# Patient Record
Sex: Male | Born: 1950 | Race: White | Hispanic: No | Smoking: Former smoker
Health system: Southern US, Community
[De-identification: ages and names within clinical notes are randomized; demographics above are authoritative.]

## PROBLEM LIST (undated history)

## (undated) DIAGNOSIS — J45909 Unspecified asthma, uncomplicated: Secondary | ICD-10-CM

## (undated) DIAGNOSIS — I639 Cerebral infarction, unspecified: Secondary | ICD-10-CM

## (undated) DIAGNOSIS — F319 Bipolar disorder, unspecified: Secondary | ICD-10-CM

## (undated) DIAGNOSIS — I2510 Atherosclerotic heart disease of native coronary artery without angina pectoris: Secondary | ICD-10-CM

## (undated) DIAGNOSIS — F988 Other specified behavioral and emotional disorders with onset usually occurring in childhood and adolescence: Secondary | ICD-10-CM

## (undated) DIAGNOSIS — F329 Major depressive disorder, single episode, unspecified: Secondary | ICD-10-CM

## (undated) DIAGNOSIS — E1165 Type 2 diabetes mellitus with hyperglycemia: Secondary | ICD-10-CM

## (undated) DIAGNOSIS — E782 Mixed hyperlipidemia: Secondary | ICD-10-CM

## (undated) DIAGNOSIS — I6529 Occlusion and stenosis of unspecified carotid artery: Secondary | ICD-10-CM

## (undated) DIAGNOSIS — F32A Depression, unspecified: Secondary | ICD-10-CM

## (undated) DIAGNOSIS — G911 Obstructive hydrocephalus: Secondary | ICD-10-CM

## (undated) DIAGNOSIS — W3400XA Accidental discharge from unspecified firearms or gun, initial encounter: Secondary | ICD-10-CM

## (undated) DIAGNOSIS — IMO0001 Reserved for inherently not codable concepts without codable children: Secondary | ICD-10-CM

## (undated) DIAGNOSIS — G8111 Spastic hemiplegia affecting right dominant side: Secondary | ICD-10-CM

## (undated) DIAGNOSIS — R1312 Dysphagia, oropharyngeal phase: Secondary | ICD-10-CM

## (undated) DIAGNOSIS — Z9289 Personal history of other medical treatment: Secondary | ICD-10-CM

## (undated) DIAGNOSIS — Z8719 Personal history of other diseases of the digestive system: Secondary | ICD-10-CM

## (undated) DIAGNOSIS — F419 Anxiety disorder, unspecified: Secondary | ICD-10-CM

## (undated) HISTORY — DX: Type 2 diabetes mellitus with hyperglycemia: E11.65

## (undated) HISTORY — PX: NASAL SEPTUM SURGERY: SHX37

## (undated) HISTORY — DX: Occlusion and stenosis of unspecified carotid artery: I65.29

## (undated) HISTORY — DX: Bipolar disorder, unspecified: F31.9

## (undated) HISTORY — DX: Obstructive hydrocephalus: G91.1

## (undated) HISTORY — PX: ABDOMINAL SURGERY: SHX537

## (undated) HISTORY — DX: Mixed hyperlipidemia: E78.2

## (undated) HISTORY — DX: Personal history of other medical treatment: Z92.89

## (undated) HISTORY — DX: Accidental discharge from unspecified firearms or gun, initial encounter: W34.00XA

## (undated) HISTORY — DX: Reserved for inherently not codable concepts without codable children: IMO0001

## (undated) HISTORY — PX: TOE SURGERY: SHX1073

## (undated) HISTORY — DX: Dysphagia, oropharyngeal phase: R13.12

## (undated) HISTORY — DX: Other specified behavioral and emotional disorders with onset usually occurring in childhood and adolescence: F98.8

## (undated) HISTORY — DX: Cerebral infarction, unspecified: I63.9

## (undated) HISTORY — DX: Spastic hemiplegia affecting right dominant side: G81.11

## (undated) HISTORY — DX: Atherosclerotic heart disease of native coronary artery without angina pectoris: I25.10

---

## 1998-04-24 ENCOUNTER — Ambulatory Visit (HOSPITAL_COMMUNITY): Admission: RE | Admit: 1998-04-24 | Discharge: 1998-04-24 | Payer: Self-pay | Admitting: Gastroenterology

## 1998-04-30 ENCOUNTER — Ambulatory Visit (HOSPITAL_COMMUNITY): Admission: RE | Admit: 1998-04-30 | Discharge: 1998-04-30 | Payer: Self-pay | Admitting: Gastroenterology

## 1998-05-29 ENCOUNTER — Encounter: Payer: Self-pay | Admitting: General Surgery

## 1998-06-03 ENCOUNTER — Inpatient Hospital Stay (HOSPITAL_COMMUNITY): Admission: EM | Admit: 1998-06-03 | Discharge: 1998-06-05 | Payer: Self-pay | Admitting: General Surgery

## 1999-01-01 ENCOUNTER — Inpatient Hospital Stay (HOSPITAL_COMMUNITY): Admission: AD | Admit: 1999-01-01 | Discharge: 1999-01-06 | Payer: Self-pay | Admitting: *Deleted

## 1999-05-29 ENCOUNTER — Ambulatory Visit (HOSPITAL_COMMUNITY): Admission: RE | Admit: 1999-05-29 | Discharge: 1999-05-29 | Payer: Self-pay | Admitting: Urology

## 1999-09-09 ENCOUNTER — Ambulatory Visit (HOSPITAL_COMMUNITY): Admission: RE | Admit: 1999-09-09 | Discharge: 1999-09-09 | Payer: Self-pay | Admitting: Gastroenterology

## 1999-11-20 ENCOUNTER — Ambulatory Visit (HOSPITAL_COMMUNITY): Admission: RE | Admit: 1999-11-20 | Discharge: 1999-11-20 | Payer: Self-pay | Admitting: Gastroenterology

## 1999-11-20 ENCOUNTER — Encounter (INDEPENDENT_AMBULATORY_CARE_PROVIDER_SITE_OTHER): Payer: Self-pay | Admitting: Specialist

## 2001-01-05 ENCOUNTER — Encounter: Admission: RE | Admit: 2001-01-05 | Discharge: 2001-01-05 | Payer: Self-pay | Admitting: Gastroenterology

## 2001-01-05 ENCOUNTER — Encounter: Payer: Self-pay | Admitting: Gastroenterology

## 2001-12-10 ENCOUNTER — Inpatient Hospital Stay (HOSPITAL_COMMUNITY): Admission: EM | Admit: 2001-12-10 | Discharge: 2001-12-16 | Payer: Self-pay | Admitting: *Deleted

## 2001-12-15 ENCOUNTER — Encounter (HOSPITAL_COMMUNITY): Payer: Self-pay | Admitting: Psychiatry

## 2001-12-15 ENCOUNTER — Ambulatory Visit (HOSPITAL_COMMUNITY): Admission: RE | Admit: 2001-12-15 | Discharge: 2001-12-15 | Payer: Self-pay | Admitting: Psychiatry

## 2001-12-19 ENCOUNTER — Other Ambulatory Visit (HOSPITAL_COMMUNITY): Admission: RE | Admit: 2001-12-19 | Discharge: 2001-12-28 | Payer: Self-pay | Admitting: Psychiatry

## 2003-05-26 HISTORY — PX: CARDIAC CATHETERIZATION: SHX172

## 2004-02-29 ENCOUNTER — Inpatient Hospital Stay (HOSPITAL_COMMUNITY): Admission: EM | Admit: 2004-02-29 | Discharge: 2004-03-03 | Payer: Self-pay | Admitting: Emergency Medicine

## 2004-02-29 ENCOUNTER — Ambulatory Visit: Payer: Self-pay | Admitting: Family Medicine

## 2004-03-03 ENCOUNTER — Encounter (INDEPENDENT_AMBULATORY_CARE_PROVIDER_SITE_OTHER): Payer: Self-pay | Admitting: *Deleted

## 2004-03-10 ENCOUNTER — Encounter: Admission: RE | Admit: 2004-03-10 | Discharge: 2004-04-03 | Payer: Self-pay | Admitting: Family Medicine

## 2004-03-17 ENCOUNTER — Encounter: Admission: RE | Admit: 2004-03-17 | Discharge: 2004-04-16 | Payer: Self-pay | Admitting: Emergency Medicine

## 2004-06-02 ENCOUNTER — Ambulatory Visit (HOSPITAL_COMMUNITY): Admission: RE | Admit: 2004-06-02 | Discharge: 2004-06-02 | Payer: Self-pay | Admitting: Cardiovascular Disease

## 2004-07-23 DIAGNOSIS — W3400XA Accidental discharge from unspecified firearms or gun, initial encounter: Secondary | ICD-10-CM

## 2004-07-23 HISTORY — DX: Accidental discharge from unspecified firearms or gun, initial encounter: W34.00XA

## 2004-07-31 ENCOUNTER — Ambulatory Visit: Payer: Self-pay | Admitting: *Deleted

## 2004-07-31 ENCOUNTER — Inpatient Hospital Stay (HOSPITAL_COMMUNITY): Admission: EM | Admit: 2004-07-31 | Discharge: 2004-09-08 | Payer: Self-pay

## 2004-08-11 ENCOUNTER — Ambulatory Visit: Payer: Self-pay | Admitting: Plastic Surgery

## 2004-08-25 ENCOUNTER — Ambulatory Visit: Payer: Self-pay | Admitting: Plastic Surgery

## 2004-09-13 ENCOUNTER — Ambulatory Visit: Payer: Self-pay | Admitting: Psychiatry

## 2004-10-30 ENCOUNTER — Other Ambulatory Visit (HOSPITAL_COMMUNITY): Admission: RE | Admit: 2004-10-30 | Discharge: 2004-11-11 | Payer: Self-pay | Admitting: Psychiatry

## 2004-10-30 ENCOUNTER — Ambulatory Visit: Payer: Self-pay | Admitting: Psychiatry

## 2004-12-02 ENCOUNTER — Ambulatory Visit (HOSPITAL_COMMUNITY): Admission: RE | Admit: 2004-12-02 | Discharge: 2004-12-02 | Payer: Self-pay | Admitting: Gastroenterology

## 2005-10-29 ENCOUNTER — Ambulatory Visit (HOSPITAL_COMMUNITY): Payer: Self-pay | Admitting: *Deleted

## 2005-12-10 ENCOUNTER — Inpatient Hospital Stay (HOSPITAL_COMMUNITY): Admission: EM | Admit: 2005-12-10 | Discharge: 2005-12-11 | Payer: Self-pay | Admitting: Emergency Medicine

## 2005-12-24 ENCOUNTER — Ambulatory Visit (HOSPITAL_COMMUNITY): Payer: Self-pay | Admitting: Licensed Clinical Social Worker

## 2006-01-05 ENCOUNTER — Ambulatory Visit (HOSPITAL_COMMUNITY): Payer: Self-pay | Admitting: *Deleted

## 2006-01-11 ENCOUNTER — Ambulatory Visit (HOSPITAL_COMMUNITY): Payer: Self-pay | Admitting: Licensed Clinical Social Worker

## 2006-01-26 ENCOUNTER — Ambulatory Visit (HOSPITAL_COMMUNITY): Payer: Self-pay | Admitting: Licensed Clinical Social Worker

## 2006-02-11 ENCOUNTER — Ambulatory Visit (HOSPITAL_COMMUNITY): Payer: Self-pay | Admitting: Licensed Clinical Social Worker

## 2006-03-08 ENCOUNTER — Ambulatory Visit (HOSPITAL_COMMUNITY): Payer: Self-pay | Admitting: Licensed Clinical Social Worker

## 2006-03-22 ENCOUNTER — Ambulatory Visit (HOSPITAL_COMMUNITY): Payer: Self-pay | Admitting: Licensed Clinical Social Worker

## 2006-04-06 ENCOUNTER — Ambulatory Visit (HOSPITAL_COMMUNITY): Payer: Self-pay | Admitting: Licensed Clinical Social Worker

## 2006-04-20 ENCOUNTER — Ambulatory Visit (HOSPITAL_COMMUNITY): Payer: Self-pay | Admitting: Licensed Clinical Social Worker

## 2006-05-26 ENCOUNTER — Ambulatory Visit (HOSPITAL_COMMUNITY): Payer: Self-pay | Admitting: Licensed Clinical Social Worker

## 2006-05-27 ENCOUNTER — Ambulatory Visit (HOSPITAL_COMMUNITY): Payer: Self-pay | Admitting: *Deleted

## 2006-06-08 ENCOUNTER — Ambulatory Visit (HOSPITAL_COMMUNITY): Payer: Self-pay | Admitting: Licensed Clinical Social Worker

## 2006-06-22 ENCOUNTER — Ambulatory Visit (HOSPITAL_COMMUNITY): Payer: Self-pay | Admitting: Licensed Clinical Social Worker

## 2006-06-29 ENCOUNTER — Ambulatory Visit (HOSPITAL_COMMUNITY): Payer: Self-pay | Admitting: *Deleted

## 2006-07-05 ENCOUNTER — Ambulatory Visit (HOSPITAL_COMMUNITY): Payer: Self-pay | Admitting: Licensed Clinical Social Worker

## 2006-08-05 ENCOUNTER — Ambulatory Visit (HOSPITAL_COMMUNITY): Payer: Self-pay | Admitting: Licensed Clinical Social Worker

## 2006-08-26 ENCOUNTER — Ambulatory Visit (HOSPITAL_COMMUNITY): Payer: Self-pay | Admitting: Licensed Clinical Social Worker

## 2006-08-31 ENCOUNTER — Ambulatory Visit (HOSPITAL_COMMUNITY): Payer: Self-pay | Admitting: *Deleted

## 2006-09-09 ENCOUNTER — Ambulatory Visit (HOSPITAL_COMMUNITY): Payer: Self-pay | Admitting: Licensed Clinical Social Worker

## 2006-09-22 ENCOUNTER — Ambulatory Visit (HOSPITAL_COMMUNITY): Payer: Self-pay | Admitting: Licensed Clinical Social Worker

## 2006-09-30 ENCOUNTER — Ambulatory Visit (HOSPITAL_COMMUNITY): Payer: Self-pay | Admitting: *Deleted

## 2006-10-04 ENCOUNTER — Ambulatory Visit (HOSPITAL_COMMUNITY): Payer: Self-pay | Admitting: Psychiatry

## 2006-10-21 ENCOUNTER — Ambulatory Visit (HOSPITAL_COMMUNITY): Payer: Self-pay | Admitting: Licensed Clinical Social Worker

## 2006-10-27 ENCOUNTER — Ambulatory Visit (HOSPITAL_COMMUNITY): Payer: Self-pay | Admitting: Licensed Clinical Social Worker

## 2006-11-04 ENCOUNTER — Ambulatory Visit (HOSPITAL_COMMUNITY): Payer: Self-pay | Admitting: *Deleted

## 2006-11-24 ENCOUNTER — Ambulatory Visit (HOSPITAL_COMMUNITY): Payer: Self-pay | Admitting: Licensed Clinical Social Worker

## 2006-12-08 ENCOUNTER — Ambulatory Visit (HOSPITAL_COMMUNITY): Payer: Self-pay | Admitting: Licensed Clinical Social Worker

## 2006-12-14 ENCOUNTER — Ambulatory Visit (HOSPITAL_COMMUNITY): Payer: Self-pay | Admitting: *Deleted

## 2006-12-27 ENCOUNTER — Ambulatory Visit (HOSPITAL_COMMUNITY): Payer: Self-pay | Admitting: Licensed Clinical Social Worker

## 2007-01-04 ENCOUNTER — Ambulatory Visit (HOSPITAL_COMMUNITY): Payer: Self-pay | Admitting: *Deleted

## 2007-01-17 ENCOUNTER — Ambulatory Visit (HOSPITAL_COMMUNITY): Payer: Self-pay | Admitting: Licensed Clinical Social Worker

## 2007-01-18 ENCOUNTER — Encounter: Admission: RE | Admit: 2007-01-18 | Discharge: 2007-02-16 | Payer: Self-pay | Admitting: Internal Medicine

## 2007-02-01 ENCOUNTER — Ambulatory Visit (HOSPITAL_COMMUNITY): Payer: Self-pay | Admitting: *Deleted

## 2007-02-10 ENCOUNTER — Ambulatory Visit (HOSPITAL_COMMUNITY): Payer: Self-pay | Admitting: Licensed Clinical Social Worker

## 2007-02-15 ENCOUNTER — Ambulatory Visit (HOSPITAL_COMMUNITY): Payer: Self-pay | Admitting: *Deleted

## 2007-02-17 ENCOUNTER — Encounter: Admission: RE | Admit: 2007-02-17 | Discharge: 2007-03-16 | Payer: Self-pay | Admitting: Internal Medicine

## 2007-03-07 ENCOUNTER — Ambulatory Visit (HOSPITAL_COMMUNITY): Payer: Self-pay | Admitting: Licensed Clinical Social Worker

## 2007-03-17 ENCOUNTER — Ambulatory Visit (HOSPITAL_COMMUNITY): Payer: Self-pay | Admitting: Licensed Clinical Social Worker

## 2007-03-24 ENCOUNTER — Ambulatory Visit (HOSPITAL_COMMUNITY): Payer: Self-pay | Admitting: Licensed Clinical Social Worker

## 2007-03-29 ENCOUNTER — Ambulatory Visit (HOSPITAL_COMMUNITY): Payer: Self-pay | Admitting: *Deleted

## 2007-04-13 ENCOUNTER — Emergency Department (HOSPITAL_COMMUNITY): Admission: EM | Admit: 2007-04-13 | Discharge: 2007-04-13 | Payer: Self-pay | Admitting: Emergency Medicine

## 2007-04-25 ENCOUNTER — Ambulatory Visit (HOSPITAL_COMMUNITY): Payer: Self-pay | Admitting: Licensed Clinical Social Worker

## 2007-05-10 ENCOUNTER — Ambulatory Visit (HOSPITAL_COMMUNITY): Payer: Self-pay | Admitting: *Deleted

## 2007-05-23 ENCOUNTER — Ambulatory Visit (HOSPITAL_COMMUNITY): Payer: Self-pay | Admitting: Licensed Clinical Social Worker

## 2007-06-02 ENCOUNTER — Inpatient Hospital Stay (HOSPITAL_COMMUNITY): Admission: RE | Admit: 2007-06-02 | Discharge: 2007-06-06 | Payer: Self-pay | Admitting: Psychiatry

## 2007-06-02 ENCOUNTER — Ambulatory Visit: Payer: Self-pay | Admitting: Psychiatry

## 2007-06-07 ENCOUNTER — Ambulatory Visit (HOSPITAL_COMMUNITY): Payer: Self-pay | Admitting: Licensed Clinical Social Worker

## 2007-06-09 ENCOUNTER — Ambulatory Visit (HOSPITAL_COMMUNITY): Payer: Self-pay | Admitting: *Deleted

## 2007-06-27 ENCOUNTER — Ambulatory Visit (HOSPITAL_COMMUNITY): Payer: Self-pay | Admitting: Licensed Clinical Social Worker

## 2007-07-06 ENCOUNTER — Ambulatory Visit (HOSPITAL_COMMUNITY): Payer: Self-pay | Admitting: Licensed Clinical Social Worker

## 2007-07-14 ENCOUNTER — Ambulatory Visit (HOSPITAL_COMMUNITY): Payer: Self-pay | Admitting: *Deleted

## 2007-07-27 ENCOUNTER — Ambulatory Visit (HOSPITAL_COMMUNITY): Payer: Self-pay | Admitting: Licensed Clinical Social Worker

## 2007-08-03 ENCOUNTER — Ambulatory Visit (HOSPITAL_COMMUNITY): Payer: Self-pay | Admitting: Licensed Clinical Social Worker

## 2007-08-10 ENCOUNTER — Ambulatory Visit (HOSPITAL_COMMUNITY): Payer: Self-pay | Admitting: Licensed Clinical Social Worker

## 2007-08-11 ENCOUNTER — Ambulatory Visit (HOSPITAL_COMMUNITY): Payer: Self-pay | Admitting: *Deleted

## 2007-08-18 ENCOUNTER — Ambulatory Visit (HOSPITAL_COMMUNITY): Payer: Self-pay | Admitting: Licensed Clinical Social Worker

## 2007-09-01 ENCOUNTER — Ambulatory Visit (HOSPITAL_COMMUNITY): Payer: Self-pay | Admitting: Licensed Clinical Social Worker

## 2007-09-07 ENCOUNTER — Ambulatory Visit (HOSPITAL_COMMUNITY): Payer: Self-pay | Admitting: Licensed Clinical Social Worker

## 2007-09-08 ENCOUNTER — Ambulatory Visit (HOSPITAL_COMMUNITY): Payer: Self-pay | Admitting: *Deleted

## 2007-09-22 ENCOUNTER — Ambulatory Visit (HOSPITAL_COMMUNITY): Payer: Self-pay | Admitting: Licensed Clinical Social Worker

## 2007-10-05 ENCOUNTER — Ambulatory Visit (HOSPITAL_COMMUNITY): Payer: Self-pay | Admitting: Licensed Clinical Social Worker

## 2007-10-05 ENCOUNTER — Emergency Department (HOSPITAL_COMMUNITY): Admission: EM | Admit: 2007-10-05 | Discharge: 2007-10-05 | Payer: Self-pay | Admitting: Emergency Medicine

## 2007-10-12 ENCOUNTER — Ambulatory Visit (HOSPITAL_COMMUNITY): Payer: Self-pay | Admitting: Licensed Clinical Social Worker

## 2007-10-13 ENCOUNTER — Ambulatory Visit (HOSPITAL_COMMUNITY): Payer: Self-pay | Admitting: *Deleted

## 2007-10-26 ENCOUNTER — Ambulatory Visit (HOSPITAL_COMMUNITY): Payer: Self-pay | Admitting: Licensed Clinical Social Worker

## 2007-11-02 ENCOUNTER — Ambulatory Visit (HOSPITAL_COMMUNITY): Payer: Self-pay | Admitting: Licensed Clinical Social Worker

## 2007-11-16 ENCOUNTER — Ambulatory Visit (HOSPITAL_COMMUNITY): Payer: Self-pay | Admitting: Licensed Clinical Social Worker

## 2007-11-22 ENCOUNTER — Ambulatory Visit (HOSPITAL_COMMUNITY): Payer: Self-pay | Admitting: *Deleted

## 2007-12-05 ENCOUNTER — Ambulatory Visit (HOSPITAL_COMMUNITY): Payer: Self-pay | Admitting: Licensed Clinical Social Worker

## 2007-12-06 ENCOUNTER — Encounter: Admission: RE | Admit: 2007-12-06 | Discharge: 2008-02-16 | Payer: Self-pay | Admitting: Emergency Medicine

## 2007-12-15 ENCOUNTER — Ambulatory Visit (HOSPITAL_COMMUNITY): Payer: Self-pay | Admitting: Licensed Clinical Social Worker

## 2007-12-21 ENCOUNTER — Ambulatory Visit (HOSPITAL_COMMUNITY): Payer: Self-pay | Admitting: Licensed Clinical Social Worker

## 2007-12-23 ENCOUNTER — Ambulatory Visit: Payer: Self-pay | Admitting: Family Medicine

## 2007-12-23 ENCOUNTER — Encounter: Payer: Self-pay | Admitting: *Deleted

## 2007-12-23 ENCOUNTER — Inpatient Hospital Stay (HOSPITAL_COMMUNITY): Admission: AD | Admit: 2007-12-23 | Discharge: 2007-12-25 | Payer: Self-pay | Admitting: Family Medicine

## 2007-12-24 ENCOUNTER — Encounter: Payer: Self-pay | Admitting: Family Medicine

## 2008-01-03 ENCOUNTER — Ambulatory Visit (HOSPITAL_COMMUNITY): Payer: Self-pay | Admitting: Licensed Clinical Social Worker

## 2008-01-03 ENCOUNTER — Ambulatory Visit (HOSPITAL_COMMUNITY): Payer: Self-pay | Admitting: *Deleted

## 2008-02-02 ENCOUNTER — Ambulatory Visit (HOSPITAL_COMMUNITY): Payer: Self-pay | Admitting: Licensed Clinical Social Worker

## 2008-02-09 ENCOUNTER — Ambulatory Visit (HOSPITAL_COMMUNITY): Payer: Self-pay | Admitting: Licensed Clinical Social Worker

## 2008-02-16 ENCOUNTER — Ambulatory Visit (HOSPITAL_COMMUNITY): Payer: Self-pay | Admitting: Licensed Clinical Social Worker

## 2008-02-23 ENCOUNTER — Ambulatory Visit (HOSPITAL_COMMUNITY): Payer: Self-pay | Admitting: Licensed Clinical Social Worker

## 2008-02-28 ENCOUNTER — Ambulatory Visit (HOSPITAL_COMMUNITY): Payer: Self-pay | Admitting: *Deleted

## 2008-03-22 ENCOUNTER — Ambulatory Visit (HOSPITAL_COMMUNITY): Payer: Self-pay | Admitting: Licensed Clinical Social Worker

## 2008-04-02 ENCOUNTER — Ambulatory Visit (HOSPITAL_COMMUNITY): Payer: Self-pay | Admitting: Licensed Clinical Social Worker

## 2008-04-12 ENCOUNTER — Ambulatory Visit (HOSPITAL_COMMUNITY): Payer: Self-pay | Admitting: *Deleted

## 2008-04-17 ENCOUNTER — Ambulatory Visit (HOSPITAL_COMMUNITY): Payer: Self-pay | Admitting: Licensed Clinical Social Worker

## 2008-05-03 ENCOUNTER — Ambulatory Visit (HOSPITAL_COMMUNITY): Payer: Self-pay | Admitting: Licensed Clinical Social Worker

## 2008-05-08 ENCOUNTER — Ambulatory Visit (HOSPITAL_COMMUNITY): Payer: Self-pay | Admitting: *Deleted

## 2008-05-10 ENCOUNTER — Ambulatory Visit (HOSPITAL_COMMUNITY): Payer: Self-pay | Admitting: Licensed Clinical Social Worker

## 2008-05-22 ENCOUNTER — Ambulatory Visit (HOSPITAL_COMMUNITY): Payer: Self-pay | Admitting: Licensed Clinical Social Worker

## 2008-05-30 ENCOUNTER — Ambulatory Visit (HOSPITAL_COMMUNITY): Payer: Self-pay | Admitting: Licensed Clinical Social Worker

## 2008-05-31 ENCOUNTER — Ambulatory Visit (HOSPITAL_COMMUNITY): Payer: Self-pay | Admitting: *Deleted

## 2008-06-06 ENCOUNTER — Ambulatory Visit (HOSPITAL_COMMUNITY): Payer: Self-pay | Admitting: Licensed Clinical Social Worker

## 2008-06-13 ENCOUNTER — Ambulatory Visit (HOSPITAL_COMMUNITY): Payer: Self-pay | Admitting: Licensed Clinical Social Worker

## 2008-06-20 ENCOUNTER — Ambulatory Visit (HOSPITAL_COMMUNITY): Payer: Self-pay | Admitting: Licensed Clinical Social Worker

## 2008-07-04 ENCOUNTER — Ambulatory Visit (HOSPITAL_COMMUNITY): Payer: Self-pay | Admitting: Licensed Clinical Social Worker

## 2008-07-26 ENCOUNTER — Ambulatory Visit (HOSPITAL_COMMUNITY): Payer: Self-pay | Admitting: *Deleted

## 2008-07-26 ENCOUNTER — Ambulatory Visit (HOSPITAL_COMMUNITY): Payer: Self-pay | Admitting: Licensed Clinical Social Worker

## 2008-08-01 ENCOUNTER — Ambulatory Visit (HOSPITAL_COMMUNITY): Payer: Self-pay | Admitting: Licensed Clinical Social Worker

## 2008-08-15 ENCOUNTER — Ambulatory Visit (HOSPITAL_COMMUNITY): Payer: Self-pay | Admitting: Licensed Clinical Social Worker

## 2008-08-23 ENCOUNTER — Ambulatory Visit (HOSPITAL_COMMUNITY): Payer: Self-pay | Admitting: Licensed Clinical Social Worker

## 2008-09-11 ENCOUNTER — Ambulatory Visit (HOSPITAL_COMMUNITY): Payer: Self-pay | Admitting: Licensed Clinical Social Worker

## 2008-09-11 ENCOUNTER — Ambulatory Visit (HOSPITAL_COMMUNITY): Payer: Self-pay | Admitting: *Deleted

## 2008-09-25 ENCOUNTER — Ambulatory Visit (HOSPITAL_COMMUNITY): Payer: Self-pay | Admitting: Licensed Clinical Social Worker

## 2008-10-03 ENCOUNTER — Ambulatory Visit (HOSPITAL_COMMUNITY): Payer: Self-pay | Admitting: Licensed Clinical Social Worker

## 2008-10-10 ENCOUNTER — Ambulatory Visit (HOSPITAL_COMMUNITY): Payer: Self-pay | Admitting: Licensed Clinical Social Worker

## 2008-10-11 ENCOUNTER — Ambulatory Visit (HOSPITAL_COMMUNITY): Payer: Self-pay | Admitting: *Deleted

## 2008-10-16 DIAGNOSIS — Z9289 Personal history of other medical treatment: Secondary | ICD-10-CM

## 2008-10-16 HISTORY — DX: Personal history of other medical treatment: Z92.89

## 2008-10-18 ENCOUNTER — Ambulatory Visit (HOSPITAL_COMMUNITY): Payer: Self-pay | Admitting: Licensed Clinical Social Worker

## 2008-10-23 ENCOUNTER — Ambulatory Visit (HOSPITAL_COMMUNITY): Payer: Self-pay | Admitting: Licensed Clinical Social Worker

## 2008-10-30 ENCOUNTER — Ambulatory Visit (HOSPITAL_COMMUNITY): Payer: Self-pay | Admitting: Licensed Clinical Social Worker

## 2008-11-01 ENCOUNTER — Ambulatory Visit (HOSPITAL_COMMUNITY): Payer: Self-pay | Admitting: *Deleted

## 2008-11-06 ENCOUNTER — Ambulatory Visit (HOSPITAL_COMMUNITY): Payer: Self-pay | Admitting: Licensed Clinical Social Worker

## 2008-11-27 ENCOUNTER — Ambulatory Visit (HOSPITAL_COMMUNITY): Payer: Self-pay | Admitting: Licensed Clinical Social Worker

## 2008-12-04 ENCOUNTER — Ambulatory Visit (HOSPITAL_COMMUNITY): Payer: Self-pay | Admitting: Licensed Clinical Social Worker

## 2008-12-06 ENCOUNTER — Ambulatory Visit (HOSPITAL_COMMUNITY): Payer: Self-pay | Admitting: *Deleted

## 2008-12-18 ENCOUNTER — Ambulatory Visit (HOSPITAL_COMMUNITY): Payer: Self-pay | Admitting: Licensed Clinical Social Worker

## 2008-12-28 ENCOUNTER — Ambulatory Visit (HOSPITAL_COMMUNITY): Payer: Self-pay | Admitting: Licensed Clinical Social Worker

## 2009-01-03 ENCOUNTER — Ambulatory Visit (HOSPITAL_COMMUNITY): Payer: Self-pay | Admitting: Licensed Clinical Social Worker

## 2009-01-16 ENCOUNTER — Ambulatory Visit (HOSPITAL_COMMUNITY): Payer: Self-pay | Admitting: Licensed Clinical Social Worker

## 2009-01-17 ENCOUNTER — Ambulatory Visit (HOSPITAL_COMMUNITY): Payer: Self-pay | Admitting: *Deleted

## 2009-02-01 ENCOUNTER — Ambulatory Visit (HOSPITAL_COMMUNITY): Payer: Self-pay | Admitting: Licensed Clinical Social Worker

## 2009-02-15 ENCOUNTER — Ambulatory Visit (HOSPITAL_COMMUNITY): Payer: Self-pay | Admitting: Licensed Clinical Social Worker

## 2009-02-22 ENCOUNTER — Ambulatory Visit (HOSPITAL_COMMUNITY): Payer: Self-pay | Admitting: Licensed Clinical Social Worker

## 2009-03-06 ENCOUNTER — Ambulatory Visit (HOSPITAL_COMMUNITY): Payer: Self-pay | Admitting: Psychiatry

## 2009-03-07 ENCOUNTER — Ambulatory Visit (HOSPITAL_COMMUNITY): Payer: Self-pay | Admitting: Licensed Clinical Social Worker

## 2009-03-22 ENCOUNTER — Ambulatory Visit (HOSPITAL_COMMUNITY): Payer: Self-pay | Admitting: Licensed Clinical Social Worker

## 2009-04-04 ENCOUNTER — Ambulatory Visit (HOSPITAL_COMMUNITY): Payer: Self-pay | Admitting: Licensed Clinical Social Worker

## 2009-04-07 ENCOUNTER — Ambulatory Visit: Payer: Self-pay | Admitting: Family Medicine

## 2009-04-07 ENCOUNTER — Encounter: Payer: Self-pay | Admitting: Emergency Medicine

## 2009-04-07 ENCOUNTER — Ambulatory Visit: Payer: Self-pay | Admitting: Diagnostic Radiology

## 2009-04-07 ENCOUNTER — Inpatient Hospital Stay (HOSPITAL_COMMUNITY): Admission: EM | Admit: 2009-04-07 | Discharge: 2009-04-16 | Payer: Self-pay | Admitting: Family Medicine

## 2009-05-22 ENCOUNTER — Emergency Department (HOSPITAL_COMMUNITY): Admission: EM | Admit: 2009-05-22 | Discharge: 2009-05-22 | Payer: Self-pay | Admitting: Emergency Medicine

## 2009-05-29 ENCOUNTER — Ambulatory Visit (HOSPITAL_COMMUNITY): Payer: Self-pay | Admitting: Psychiatry

## 2009-06-03 ENCOUNTER — Ambulatory Visit (HOSPITAL_COMMUNITY): Payer: Self-pay | Admitting: Licensed Clinical Social Worker

## 2009-06-10 ENCOUNTER — Ambulatory Visit (HOSPITAL_COMMUNITY): Payer: Self-pay | Admitting: Licensed Clinical Social Worker

## 2009-06-17 ENCOUNTER — Ambulatory Visit (HOSPITAL_COMMUNITY): Payer: Self-pay | Admitting: Licensed Clinical Social Worker

## 2009-08-02 ENCOUNTER — Ambulatory Visit (HOSPITAL_COMMUNITY): Payer: Self-pay | Admitting: Psychiatry

## 2009-08-27 ENCOUNTER — Ambulatory Visit: Payer: Self-pay | Admitting: Diagnostic Radiology

## 2009-08-27 ENCOUNTER — Encounter: Payer: Self-pay | Admitting: Emergency Medicine

## 2009-08-28 ENCOUNTER — Inpatient Hospital Stay (HOSPITAL_COMMUNITY): Admission: EM | Admit: 2009-08-28 | Discharge: 2009-09-03 | Payer: Self-pay | Admitting: Family Medicine

## 2009-08-28 ENCOUNTER — Ambulatory Visit: Payer: Self-pay | Admitting: Family Medicine

## 2010-03-10 ENCOUNTER — Emergency Department (HOSPITAL_COMMUNITY): Admission: EM | Admit: 2010-03-10 | Discharge: 2010-03-10 | Payer: Self-pay | Admitting: Emergency Medicine

## 2010-03-12 ENCOUNTER — Emergency Department (HOSPITAL_BASED_OUTPATIENT_CLINIC_OR_DEPARTMENT_OTHER): Admission: EM | Admit: 2010-03-12 | Discharge: 2010-03-12 | Payer: Self-pay | Admitting: Emergency Medicine

## 2010-03-14 ENCOUNTER — Encounter: Admission: RE | Admit: 2010-03-14 | Discharge: 2010-03-14 | Payer: Self-pay | Admitting: Emergency Medicine

## 2010-05-27 ENCOUNTER — Ambulatory Visit
Admission: RE | Admit: 2010-05-27 | Discharge: 2010-05-27 | Payer: Self-pay | Source: Home / Self Care | Attending: Licensed Clinical Social Worker | Admitting: Licensed Clinical Social Worker

## 2010-05-27 ENCOUNTER — Ambulatory Visit: Admit: 2010-05-27 | Payer: Self-pay | Admitting: Licensed Clinical Social Worker

## 2010-06-12 ENCOUNTER — Ambulatory Visit
Admission: RE | Admit: 2010-06-12 | Discharge: 2010-06-12 | Payer: Self-pay | Source: Home / Self Care | Attending: Licensed Clinical Social Worker | Admitting: Licensed Clinical Social Worker

## 2010-06-16 ENCOUNTER — Encounter: Payer: Self-pay | Admitting: Emergency Medicine

## 2010-06-24 ENCOUNTER — Ambulatory Visit: Admit: 2010-06-24 | Payer: Self-pay | Admitting: Licensed Clinical Social Worker

## 2010-07-03 ENCOUNTER — Ambulatory Visit: Payer: Self-pay | Admitting: Licensed Clinical Social Worker

## 2010-07-09 ENCOUNTER — Ambulatory Visit (INDEPENDENT_AMBULATORY_CARE_PROVIDER_SITE_OTHER): Payer: Medicare Other | Admitting: Licensed Clinical Social Worker

## 2010-07-09 DIAGNOSIS — F3189 Other bipolar disorder: Secondary | ICD-10-CM

## 2010-07-16 ENCOUNTER — Ambulatory Visit (INDEPENDENT_AMBULATORY_CARE_PROVIDER_SITE_OTHER): Payer: Medicare Other | Admitting: Licensed Clinical Social Worker

## 2010-07-16 DIAGNOSIS — F3189 Other bipolar disorder: Secondary | ICD-10-CM

## 2010-07-29 ENCOUNTER — Ambulatory Visit (INDEPENDENT_AMBULATORY_CARE_PROVIDER_SITE_OTHER): Payer: Medicare Other | Admitting: Licensed Clinical Social Worker

## 2010-07-29 DIAGNOSIS — F3189 Other bipolar disorder: Secondary | ICD-10-CM

## 2010-08-13 LAB — GLUCOSE, CAPILLARY
Glucose-Capillary: 103 mg/dL — ABNORMAL HIGH (ref 70–99)
Glucose-Capillary: 124 mg/dL — ABNORMAL HIGH (ref 70–99)
Glucose-Capillary: 125 mg/dL — ABNORMAL HIGH (ref 70–99)
Glucose-Capillary: 138 mg/dL — ABNORMAL HIGH (ref 70–99)
Glucose-Capillary: 142 mg/dL — ABNORMAL HIGH (ref 70–99)
Glucose-Capillary: 144 mg/dL — ABNORMAL HIGH (ref 70–99)
Glucose-Capillary: 148 mg/dL — ABNORMAL HIGH (ref 70–99)
Glucose-Capillary: 148 mg/dL — ABNORMAL HIGH (ref 70–99)
Glucose-Capillary: 151 mg/dL — ABNORMAL HIGH (ref 70–99)
Glucose-Capillary: 152 mg/dL — ABNORMAL HIGH (ref 70–99)
Glucose-Capillary: 153 mg/dL — ABNORMAL HIGH (ref 70–99)
Glucose-Capillary: 154 mg/dL — ABNORMAL HIGH (ref 70–99)
Glucose-Capillary: 172 mg/dL — ABNORMAL HIGH (ref 70–99)
Glucose-Capillary: 175 mg/dL — ABNORMAL HIGH (ref 70–99)
Glucose-Capillary: 189 mg/dL — ABNORMAL HIGH (ref 70–99)
Glucose-Capillary: 224 mg/dL — ABNORMAL HIGH (ref 70–99)
Glucose-Capillary: 87 mg/dL (ref 70–99)
Glucose-Capillary: 93 mg/dL (ref 70–99)

## 2010-08-13 LAB — CULTURE, BLOOD (ROUTINE X 2): Culture: NO GROWTH

## 2010-08-13 LAB — CBC
HCT: 34.6 % — ABNORMAL LOW (ref 39.0–52.0)
Hemoglobin: 11.9 g/dL — ABNORMAL LOW (ref 13.0–17.0)
MCHC: 33.7 g/dL (ref 30.0–36.0)
MCHC: 34.6 g/dL (ref 30.0–36.0)
MCV: 83.4 fL (ref 78.0–100.0)
Platelets: 193 10*3/uL (ref 150–400)
Platelets: 217 10*3/uL (ref 150–400)
Platelets: 226 10*3/uL (ref 150–400)
RDW: 15.8 % — ABNORMAL HIGH (ref 11.5–15.5)
WBC: 8.3 10*3/uL (ref 4.0–10.5)
WBC: 6.6 10*3/uL (ref 4.0–10.5)

## 2010-08-13 LAB — BASIC METABOLIC PANEL
BUN: 13 mg/dL (ref 6–23)
BUN: 23 mg/dL (ref 6–23)
BUN: 28 mg/dL — ABNORMAL HIGH (ref 6–23)
CO2: 21 mEq/L (ref 19–32)
CO2: 22 mEq/L (ref 19–32)
Calcium: 8.6 mg/dL (ref 8.4–10.5)
Chloride: 106 mEq/L (ref 96–112)
Chloride: 114 mEq/L — ABNORMAL HIGH (ref 96–112)
Creatinine, Ser: 0.94 mg/dL (ref 0.4–1.5)
Creatinine, Ser: 0.99 mg/dL (ref 0.4–1.5)
Creatinine, Ser: 1 mg/dL (ref 0.4–1.5)
GFR calc Af Amer: >60 mL/min (ref >60)
GFR calc Af Amer: >60 mL/min (ref >60)
GFR calc non Af Amer: >60 mL/min (ref >60)
GFR calc non Af Amer: >60 mL/min (ref >60)
Glucose, Bld: 151 mg/dL — ABNORMAL HIGH (ref 70–99)
Potassium: 3.2 mEq/L — ABNORMAL LOW (ref 3.5–5.1)
Potassium: 3.3 mEq/L — ABNORMAL LOW (ref 3.5–5.1)
Potassium: 4 mEq/L (ref 3.5–5.1)
Sodium: 142 mEq/L (ref 135–145)

## 2010-08-13 LAB — POCT CARDIAC MARKERS
CKMB, poc: <1 ng/mL — ABNORMAL LOW (ref 1.0–8.0)
Myoglobin, poc: 39.6 ng/mL (ref 12–200)
Troponin i, poc: <0.05 ng/mL (ref 0.00–0.09)

## 2010-08-13 LAB — URINE CULTURE
Colony Count: NO GROWTH
Culture: NO GROWTH

## 2010-08-13 LAB — MAGNESIUM: Magnesium: 1.9 mg/dL (ref 1.5–2.5)

## 2010-08-13 LAB — URINALYSIS, ROUTINE W REFLEX MICROSCOPIC
Ketones, ur: 15 mg/dL — AB
Leukocytes, UA: NEGATIVE
Nitrite: NEGATIVE
Protein, ur: NEGATIVE mg/dL
Urobilinogen, UA: 1 mg/dL (ref 0.0–1.0)

## 2010-08-13 LAB — HEMOGLOBIN A1C
Hgb A1c MFr Bld: 7.2 % — ABNORMAL HIGH (ref 4.6–6.1)
Mean Plasma Glucose: 160 mg/dL

## 2010-08-13 LAB — COMPREHENSIVE METABOLIC PANEL
CO2: 21 mEq/L (ref 19–32)
Calcium: 8.6 mg/dL (ref 8.4–10.5)
Creatinine, Ser: 0.99 mg/dL (ref 0.4–1.5)
GFR calc non Af Amer: >60 mL/min (ref >60)
Glucose, Bld: 212 mg/dL — ABNORMAL HIGH (ref 70–99)
Sodium: 139 mEq/L (ref 135–145)
Total Protein: 4.7 g/dL — ABNORMAL LOW (ref 6.0–8.3)

## 2010-08-13 LAB — DIFFERENTIAL
Lymphocytes Relative: 23 % (ref 12–46)
Lymphs Abs: 1.5 10*3/uL (ref 0.7–4.0)
Neutrophils Relative %: 69 % (ref 43–77)

## 2010-08-13 LAB — PREALBUMIN: Prealbumin: 15.1 mg/dL — ABNORMAL LOW (ref 18.0–45.0)

## 2010-08-15 ENCOUNTER — Emergency Department (HOSPITAL_COMMUNITY)
Admission: EM | Admit: 2010-08-15 | Discharge: 2010-08-15 | Disposition: A | Discharge status: Home / Self Care | Payer: Medicare Other | Attending: Emergency Medicine | Admitting: Emergency Medicine

## 2010-08-15 DIAGNOSIS — E7800 Pure hypercholesterolemia, unspecified: Secondary | ICD-10-CM | POA: Insufficient documentation

## 2010-08-15 DIAGNOSIS — F411 Generalized anxiety disorder: Secondary | ICD-10-CM | POA: Insufficient documentation

## 2010-08-15 DIAGNOSIS — R61 Generalized hyperhidrosis: Secondary | ICD-10-CM | POA: Insufficient documentation

## 2010-08-15 DIAGNOSIS — F3130 Bipolar disorder, current episode depressed, mild or moderate severity, unspecified: Secondary | ICD-10-CM | POA: Insufficient documentation

## 2010-08-15 DIAGNOSIS — Z79899 Other long term (current) drug therapy: Secondary | ICD-10-CM | POA: Insufficient documentation

## 2010-08-15 DIAGNOSIS — Z794 Long term (current) use of insulin: Secondary | ICD-10-CM | POA: Insufficient documentation

## 2010-08-15 DIAGNOSIS — Z7982 Long term (current) use of aspirin: Secondary | ICD-10-CM | POA: Insufficient documentation

## 2010-08-15 DIAGNOSIS — Z8673 Personal history of transient ischemic attack (TIA), and cerebral infarction without residual deficits: Secondary | ICD-10-CM | POA: Insufficient documentation

## 2010-08-15 DIAGNOSIS — R5381 Other malaise: Secondary | ICD-10-CM | POA: Insufficient documentation

## 2010-08-15 DIAGNOSIS — E119 Type 2 diabetes mellitus without complications: Secondary | ICD-10-CM | POA: Insufficient documentation

## 2010-08-15 DIAGNOSIS — I10 Essential (primary) hypertension: Secondary | ICD-10-CM | POA: Insufficient documentation

## 2010-08-15 LAB — CBC
Hemoglobin: 15.1 g/dL (ref 13.0–17.0)
MCH: 29.5 pg (ref 26.0–34.0)
MCHC: 34.4 g/dL (ref 30.0–36.0)
MCV: 85.9 fL (ref 78.0–100.0)
Platelets: 191 10*3/uL (ref 150–400)
RBC: 5.11 MIL/uL (ref 4.22–5.81)

## 2010-08-15 LAB — DIFFERENTIAL
Basophils Relative: 0 % (ref 0–1)
Eosinophils Absolute: 0.2 10*3/uL (ref 0.0–0.7)
Lymphs Abs: 2.2 10*3/uL (ref 0.7–4.0)
Monocytes Absolute: 0.8 10*3/uL (ref 0.1–1.0)
Monocytes Relative: 8 % (ref 3–12)
Neutrophils Relative %: 68 % (ref 43–77)

## 2010-08-15 LAB — POCT CARDIAC MARKERS
CKMB, poc: <1 ng/mL — ABNORMAL LOW (ref 1.0–8.0)
Myoglobin, poc: 57.4 ng/mL (ref 12–200)
Troponin i, poc: <0.05 ng/mL (ref 0.00–0.09)

## 2010-08-15 LAB — BASIC METABOLIC PANEL
CO2: 27 mEq/L (ref 19–32)
Chloride: 105 mEq/L (ref 96–112)
GFR calc Af Amer: >60 mL/min (ref >60)
Glucose, Bld: 257 mg/dL — ABNORMAL HIGH (ref 70–99)
Sodium: 139 mEq/L (ref 135–145)

## 2010-08-15 LAB — URINALYSIS, ROUTINE W REFLEX MICROSCOPIC
Bilirubin Urine: NEGATIVE
Glucose, UA: >1000 mg/dL — AB
Ketones, ur: 15 mg/dL — AB
Protein, ur: NEGATIVE mg/dL
pH: 6.5 (ref 5.0–8.0)

## 2010-08-16 ENCOUNTER — Encounter: Payer: Self-pay | Admitting: Internal Medicine

## 2010-08-16 ENCOUNTER — Observation Stay (HOSPITAL_COMMUNITY)
Admission: EM | Admit: 2010-08-16 | Discharge: 2010-08-18 | DRG: 313 | Disposition: A | Discharge status: Home / Self Care | Payer: Medicare Other | Attending: Internal Medicine | Admitting: Internal Medicine

## 2010-08-16 ENCOUNTER — Emergency Department (HOSPITAL_COMMUNITY): Payer: Medicare Other

## 2010-08-16 DIAGNOSIS — R279 Unspecified lack of coordination: Secondary | ICD-10-CM | POA: Insufficient documentation

## 2010-08-16 DIAGNOSIS — F319 Bipolar disorder, unspecified: Secondary | ICD-10-CM | POA: Insufficient documentation

## 2010-08-16 DIAGNOSIS — E119 Type 2 diabetes mellitus without complications: Secondary | ICD-10-CM | POA: Insufficient documentation

## 2010-08-16 DIAGNOSIS — R0789 Other chest pain: Principal | ICD-10-CM | POA: Insufficient documentation

## 2010-08-16 DIAGNOSIS — F411 Generalized anxiety disorder: Secondary | ICD-10-CM | POA: Insufficient documentation

## 2010-08-16 DIAGNOSIS — Z8673 Personal history of transient ischemic attack (TIA), and cerebral infarction without residual deficits: Secondary | ICD-10-CM | POA: Insufficient documentation

## 2010-08-16 DIAGNOSIS — E785 Hyperlipidemia, unspecified: Secondary | ICD-10-CM | POA: Insufficient documentation

## 2010-08-16 DIAGNOSIS — Z79899 Other long term (current) drug therapy: Secondary | ICD-10-CM | POA: Insufficient documentation

## 2010-08-16 DIAGNOSIS — I10 Essential (primary) hypertension: Secondary | ICD-10-CM | POA: Insufficient documentation

## 2010-08-16 LAB — POCT CARDIAC MARKERS
CKMB, poc: <1 ng/mL — ABNORMAL LOW (ref 1.0–8.0)
Myoglobin, poc: 52.8 ng/mL (ref 12–200)

## 2010-08-16 LAB — COMPREHENSIVE METABOLIC PANEL
AST: 28 U/L (ref 0–37)
CO2: 25 mEq/L (ref 19–32)
Calcium: 8.9 mg/dL (ref 8.4–10.5)
Creatinine, Ser: 0.94 mg/dL (ref 0.4–1.5)
GFR calc Af Amer: >60 mL/min (ref >60)
GFR calc non Af Amer: >60 mL/min (ref >60)
Total Protein: 5.9 g/dL — ABNORMAL LOW (ref 6.0–8.3)

## 2010-08-16 LAB — DIFFERENTIAL
Basophils Absolute: 0 10*3/uL (ref 0.0–0.1)
Basophils Relative: 0 % (ref 0–1)
Lymphocytes Relative: 22 % (ref 12–46)
Monocytes Absolute: 0.6 10*3/uL (ref 0.1–1.0)
Neutro Abs: 5 10*3/uL (ref 1.7–7.7)
Neutrophils Relative %: 67 % (ref 43–77)

## 2010-08-16 LAB — CBC
Hemoglobin: 13.7 g/dL (ref 13.0–17.0)
MCHC: 34.2 g/dL (ref 30.0–36.0)
RBC: 4.62 MIL/uL (ref 4.22–5.81)
WBC: 7.4 10*3/uL (ref 4.0–10.5)

## 2010-08-16 LAB — ETHANOL: Alcohol, Ethyl (B): <5 mg/dL (ref 0–10)

## 2010-08-16 LAB — TROPONIN I: Troponin I: 0.01 ng/mL (ref 0.00–0.06)

## 2010-08-16 LAB — GLUCOSE, CAPILLARY: Glucose-Capillary: 342 mg/dL — ABNORMAL HIGH (ref 70–99)

## 2010-08-16 LAB — TSH: TSH: 1.103 u[IU]/mL (ref 0.350–4.500)

## 2010-08-16 NOTE — H&P (Signed)
Hospital Admission Note Date: 08/16/2010  Patient name: Kyle [name] Medical record number: [id number] Date of birth: 26-Apr-1951 Age: 60 y.o. Gender: male PCP: No primary provider on file.  Medical Service: Internal Medicine Teaching Service B1  Attending physician:  Dr. Julaine Fusi   Resident [phone]):  Laurel Dimmer   Pager: [phone] Resident (R1): Bard Herbert   Pager: [phone]  Chief Complaint: Chest tightness, anxiety  History of Present Illness: Patient is a 60 year old man, who is a resident of an assisted living facility at [facility] and a PMH consisting of bipolar disorder, CVA, GERD, DM, HTN, HLD, ataxia, and left lung mass who presented to the [facility] ER because of chest tightness and anxiety.  He originally presented on 08/15/10 and was sent home on his medications after Ativan.  He returned today because he was feeling a "full body tightness" and anxiety again that started when he woke up this morning.  He states that he has a long history of bipolar and anxiety and is followed by Dr. Donell Beers.  He also states that he hasn't been sleeping well at night for the last 3 nights and has always been worrying about many things.  The tightness in his chest started when he woke up this morning.  It was not associated with exertion.  He was short of breath and diaphoretic.  He was given 2 mg of Ativan in the ER and states that the tightness sensation is much better and he is no longer short of breath.  Denies fever, chills, nausea, vomiting, diarrhea, constipation, abdominal pain, chest pain, changes in his bowel or bladder.  He last saw Dr. Donell Beers 3 weeks ago and states that several of his medications were changed but can not remember which ones.  He states that his ALF give him his medications and he gets his meals from them as well.  Medications: 1. Aspirin 81 mg daily 2. Bupropion 300 mg in AM 3. Calcium+D 1 tab Qhs 4. Cymbalta 60 mg in AM 5. Ferrous sulfate 325 mg PO daily 6. Lantus  30 units in AM and 20 units in AM 7. Lansoprazole 30 mg BID 8. Lipitor 80 mg daily 9. Lorazepam 1 mg PO TID 10. Metformin 500 mg BID 11. Metoprolol 25 mg daily 12. Oxcarbazepine 300 mg BID 13. Seroquel 200 mg daily 14. Zolpidem 10 mg Qhs  Allergies: NKDA  Past Medical History:   1. CVA left putamen infarct in 10/05, (MRA of the neck mentioned severe stenosis of the right proximal ICA but subsequent cerebral angiogram showed only 30% stenosis that was non-obstructive) 2. GERD 3. Diabetes type II (7.2 on 08/2009) 4. Hypertension 5. Dysphagia with work up done at [facility] with Nissen fundoplycation with no evidence of malignancy 6. Bipolar disorder followed by Dr. Donell Beers 7. HLD with no recent lipid panel 8. History of Ataxia followed by Dr. Hyacinth Meeker 9. History of previous substance abuse including alcohol and marijuana  Past Surgical History: 1. Nissen Fundoplycation  Family History: No family history of early CAD.  History   Social History  . Marital Status: Legally Separated    Spouse Name: N/A    Number of Children: 2  . Years of Education: Not assessed   Occupational History  . Disabled.   Social History Main Topics  . Smoking status: Former smoker quit 1970s.  . Smokeless tobacco: Never used  . Alcohol Use: Clean for 10 years previously heavy drinker  . Drug Use: none  . Sexually Active:  Not currently   Review of Systems: Constitutional: Positive for diaphoresis Denies fever, chills, appetite change and fatigue.  HEENT: Denies photophobia, eye pain, redness, hearing loss, ear pain, congestion, sore throat, rhinorrhea, sneezing, mouth sores, trouble swallowing, neck pain, neck stiffness and tinnitus.   Respiratory: Positive for SOB and chest "tightness" Denies DOE, cough, and wheezing.   Cardiovascular: Denies chest pain, palpitations and leg swelling.  Gastrointestinal: Denies nausea, vomiting, abdominal pain, diarrhea, constipation, blood in stool and abdominal  distention.  Genitourinary: Denies dysuria, urgency, frequency, hematuria, flank pain and difficulty urinating.  Musculoskeletal: Denies myalgias, back pain, joint swelling, arthralgias and gait problem.  Skin: Denies pallor, rash and wound.  Neurological: Denies dizziness, seizures, syncope, weakness, light-headedness, numbness and headaches.  Hematological: Denies adenopathy. Easy bruising, personal or family bleeding history  Psychiatric/Behavioral: Positive for anxiety and sleep disturbance. Denies suicidal ideation, mood changes, confusion, and agitation  Physical Exam:  Vitals: Tm: 98.8 Pulse: 73  Blood Pressure: 147/85  Resp: 22  O2 Sats: 98% on RA Constitutional: Vital signs reviewed.  Patient is a well-developed and well-nourished man in mild distress and cooperative with exam. Alert and oriented x3. Anxious appearing Head: Normocephalic and atraumatic Ear: TM normal bilaterally Mouth: no erythema or exudates, MMM Eyes: PERRL, EOMI, conjunctivae normal, No scleral icterus.  Neck: Supple, Trachea midline normal ROM, No JVD, mass, thyromegaly, or carotid bruit present.  Cardiovascular: RRR, S1 normal, S2 normal, no MRG, pulses symmetric and intact bilaterally Pulmonary/Chest: CTAB, no wheezes, rales, or rhonchi Abdominal: Soft. Non-tender, non-distended, bowel sounds are normal, no masses, organomegaly, or guarding present.  GU: no CVA tenderness Musculoskeletal: No joint deformities, erythema, or stiffness, ROM full and no nontender Hematology: no cervical, inginal, or axillary adenopathy.  Neurological: A&O x3, Strength is normal and symmetric bilaterally, cranial nerve II-XII are grossly intact, no focal motor deficit, sensory intact to light touch bilaterally.  Unable to assess gait.    Skin: Warm, dry and intact. No rash, cyanosis, or clubbing.  Psychiatric: moderately anxious and jittery. Affect was flat. speech was non-rushed and thought processes were normal. Judgment was poor  but thought content normal. Cognition and memory are normal.    Lab results:  CBC:    Component Value Date/Time   WBC 7.4 08/16/2010 1553   HGB 13.7 08/16/2010 1553   HCT 40.1 08/16/2010 1553   PLT 181 08/16/2010 1553   MCV 86.8 08/16/2010 1553   NEUTROABS 5.0 08/16/2010 1553   LYMPHSABS 1.6 08/16/2010 1553   MONOABS 0.6 08/16/2010 1553   EOSABS 0.2 08/16/2010 1553   BASOSABS 0.0 08/16/2010 1553   Comprehensive Metabolic Panel:    Component Value Date/Time   NA 137 08/16/2010 1553   K 4.3 08/16/2010 1553   CL 106 08/16/2010 1553   CO2 25 08/16/2010 1553   BUN 23 08/16/2010 1553   CREATININE 0.94 08/16/2010 1553   GLUCOSE 280* 08/16/2010 1553   CALCIUM 8.9 08/16/2010 1553   AST 28 08/16/2010 1553   ALT 39 08/16/2010 1553   ALKPHOS 119* 08/16/2010 1553   BILITOT 0.5 08/16/2010 1553   PROT 5.9* 08/16/2010 1553   ALBUMIN 3.9 08/16/2010 1553   X2 CE:  Trop <0.05  Myo 52.8  Tegretol level: <2   Color, Urine                             YELLOW            YELLOW  Appearance  CLEAR             CLEAR  Specific Gravity                       1.033      h      1.005-1.030  pH                                            6.5               5.0-8.0  Urine Glucose                          >1000      a      NEG              mg/dL  Bilirubin                                    NEGATIVE          NEG  Ketones                                   15         a      NEG              mg/dL  Blood                                        NEGATIVE          NEG  Protein                                      NEGATIVE          NEG              mg/dL  Urobilinogen                             1.0               0.0-1.0          mg/dL  Nitrite                                        NEGATIVE          NEG  Leukocytes                               NEGATIVE          NEG  Alcohol: <5  Imaging results:  Chest x-ray   Findings: Cardiomediastinal silhouette is within normal limits.  The lungs are free of  focal consolidations and pleural effusions.   No edema.  There is deformity of the left  posterior lateral sixth  rib.  Otherwise, osseous structures  have a normal appearance.  Assessment & Plan by Problem: 1. Chest tightness:  Differential diagnosis includes myocardial infarction, pneumonia, PE, anxiety, GERD, musculoskeletal chest pain, and aortic dissection.  Given the lack of fevers, normal WBC count, and normal chest x-ray findings pneumonia is unlikely.  With normal Chest x-ray and normal blood pressure aortic dissection and PE are also unlikely.  There is no tenderness to palpation over the anterior chest and sternum so musculoskeletal pain is unlikely.  Most likely this is related to his anxiety given the improvement with Ativan.  He however does have slight T wave changes on his EKG and multiple risk factors including HTN, HLD, and diabetes so we will plan to admit him for ACS rule out.    - Admit to telemetry for observation  - recheck EKG in the am  - CE q8 x3  - Risk stratification with HgBA1C, FLP, and TSH.   - PPI for GERD  - continue daily aspirin 81 mg and home statin   2. Anxiety/Bipolar disorder:  He clearly has anxiety issues but currently denies HI and SI.  His anxiety is not well currently but is not a danger to himself or others at this time.  He would likely need close follow up on discharge with his outpatient psychatrist.  - Continue home Buproprion, cymbalta, Seroquel, and ambien  - Scheduled ativan 1 mg TID  3. Diabetes: His last A1C in the system was in April 2011 and was 7.2 which shows decent control. We will recheck this today and adjust his medications as needed here in the hospital. He will also need follow up for this as an outpatient.  - Continue lantus at home dose of 30 in AM and 15 in PM  - Continue SSI Sensitive  4. Hyperlipidemia:  Will check fasting lipid panel since we don't have a recent one in the system.  - Continue lipitor at home dose.  5. History  of Lung mass: Originally found in April 2011 and recommended follow up in 6-12 months.   - Check PA and Lateral chest x-ray for evaluation  6. History of CVA: He has a remote history of stroke in 2005.  He did have an MRA of his neck that showed a severe stenosis of his Right ICA but subsequent Cerebral angiogram in 2006 showed only 30% non-obstructive lesion in the right ICA.  Patient is current asymptomatic and risk stratification for #1 should help lower stroke risk in this patient  - Continue aspirin 81 mg and Lipitor 80 mg daily  7. VTE prophylaxis: Lovenox 40 mg [place] daily  R1 Bard Herbert, MD     R2 Laurel Dimmer, MD      ATTENDING: I performed and/or observed a history and physical examination of the patient.  I discussed the case with the residents as noted and reviewed the residents' notes.  I agree with the findings and plan--please refer to the attending physician note for more details.  Signature________________________________  Printed Name_____________________________

## 2010-08-17 ENCOUNTER — Inpatient Hospital Stay (HOSPITAL_COMMUNITY): Payer: Medicare Other

## 2010-08-17 DIAGNOSIS — R61 Generalized hyperhidrosis: Secondary | ICD-10-CM

## 2010-08-17 DIAGNOSIS — R079 Chest pain, unspecified: Secondary | ICD-10-CM

## 2010-08-17 LAB — GLUCOSE, CAPILLARY
Glucose-Capillary: 83 mg/dL (ref 70–99)
Glucose-Capillary: 276 mg/dL — ABNORMAL HIGH (ref 70–99)

## 2010-08-17 LAB — LIPID PANEL
Cholesterol: 105 mg/dL (ref 0–200)
LDL Cholesterol: 56 mg/dL (ref 0–99)
VLDL: 11 mg/dL (ref 0–40)

## 2010-08-17 LAB — CARDIAC PANEL(CRET KIN+CKTOT+MB+TROPI)
CK, MB: 2.3 ng/mL (ref 0.3–4.0)
Relative Index: INVALID (ref 0.0–2.5)
Troponin I: 0.01 ng/mL (ref 0.00–0.06)

## 2010-08-17 LAB — BASIC METABOLIC PANEL
BUN: 24 mg/dL — ABNORMAL HIGH (ref 6–23)
Chloride: 110 mEq/L (ref 96–112)
Glucose, Bld: 176 mg/dL — ABNORMAL HIGH (ref 70–99)
Potassium: 3.9 mEq/L (ref 3.5–5.1)

## 2010-08-17 LAB — CBC
MCHC: 34.5 g/dL (ref 30.0–36.0)
RDW: 13.3 % (ref 11.5–15.5)

## 2010-08-17 LAB — HEMOGLOBIN A1C
Hgb A1c MFr Bld: 8.2 % — ABNORMAL HIGH (ref <5.7)
Mean Plasma Glucose: 189 mg/dL — ABNORMAL HIGH (ref <117)

## 2010-08-17 MED ORDER — IOHEXOL 300 MG/ML  SOLN
80.0000 mL | Freq: Once | INTRAMUSCULAR | Status: AC | PRN
  Administered 2010-08-17: 80 mL via INTRAVENOUS

## 2010-08-18 DIAGNOSIS — R61 Generalized hyperhidrosis: Secondary | ICD-10-CM

## 2010-08-18 DIAGNOSIS — R079 Chest pain, unspecified: Secondary | ICD-10-CM

## 2010-08-18 LAB — GLUCOSE, CAPILLARY: Glucose-Capillary: 78 mg/dL (ref 70–99)

## 2010-08-25 NOTE — Discharge Summary (Signed)
NAMEEBIN, PALAZZI            ACCOUNT NO.:  [id number]  MEDICAL RECORD NO.:  [id number]           PATIENT TYPE:  I  LOCATION:  3710                         FACILITY:  [facility]  PHYSICIAN:  Doneen Poisson, MD     DATE OF BIRTH:  1951-03-18  DATE OF ADMISSION:  08/16/2010 DATE OF DISCHARGE:  08/18/2010                              DISCHARGE SUMMARY   DISCHARGE DIAGNOSES: 1. Chest tightness. 2. Bipolar disorder. 3. Anxiety. 4. Diabetes mellitus type 2. 5. Hyperlipidemia. 6. Gastroesophageal reflux disease. 7. Hypertension. 8. History of dysphasia with Nissen fundoplication. 9. History of ataxia followed by Dr. Hyacinth Meeker. 10.History of polysubstance abuse including alcohol and marijuana.  DISCHARGE MEDICATIONS: 1. Aspirin 81 mg take 1 tablet by mouth daily. 2. Bupropion 300 mg XL tablets take 300 mg daily by mouth. 3. Cymbalta 60 mg tablets take 1 tablet by mouth daily. 4. Lantus take 30 units subcutaneously in the morning and 20 units     subcutaneously at bedtime. 5. Lansoprazole 30 mg take 1 tablet twice daily. 6. Lipitor 80 mg take 1 tablet daily by mouth. 7. Lorazepam 1 mg tablet take 1 mg 3 times daily as needed for     anxiety. 8. Metformin 500 mg tablet take twice daily. 9. Seroquel 100 mg tablets take 1 tablet by mouth at bedtime. 10.Temazepam 30 mg by mouth at bedtime. 11.Vistaril 25 mg caplets to take every 12 hours.  CONSULTATIONS:  None.  PROCEDURES PERFORMED: 1. A chest x-ray, which showed no active disease.  Healed left     posterior sixth rib fracture, which is directly in the region of     the opacity seen in the April 2011 chest x-ray. 2. A chest CT with contrast.  Findings were, both lungs were clear.     There is no axillary hilar and mediastinal lymphadenopathy or     atherosclerotic consultations were seen within the transverse     coronary arteries.  Postsurgical changes were noted at the EG     junction, otherwise the visual upper abdomen is  unremarkable.     Negative CT scan of the chest.  INITIAL HISTORY OF PRESENT ILLNESS:  [name] is a 60 year old  man who is a resident of an Assisted Facility at [facility]  with a past medical history consisting of bipolar disorder, history  of remote stroke, GERD, diabetes mellitus, hypertension,  hyperlipidemia, and ataxia who presented to the [facility] Emergency Department because of chest tightness and anxiety.  He originally presented on August 15, 2010, and was sent home after an injection of Ativan to continue on his home medications.  He returned today because he was feeling full body tightness and anxiety that started when he woke up this morning.  He states he has long history of bipolar and anxiety and he is followed by Dr. Donell Beers.  He also states that he has not been sleeping well for the last 3 nights and has always been worrying about many different things.  The tightness in his chest started when he woke up this morning, not associated with exertion.  He is short of breath and diaphoretic.  He was given 2 mg of Ativan in the ER and states that tightness sensation was much better and he had no more shortness of breath.  He denies any fevers, chills, nausea, vomiting, diarrhea, constipation, abdominal pain, chest pain or changes in his bowel or bladder.  He last saw Dr. Donell Beers 3 weeks ago and states that several of his medications were changed, but he cannot remember which ones.  He states that the assisted living facility gives him his medications and he gets his meals from there as well.  ADMISSION PHYSICAL EXAMINATION: VITAL SIGNS:  Temperature 98.8, pulse 73, blood pressure 147/85,  respiratory rate 22, O2 sat 98% on room air. GENERAL:  Well-developed, well-nourished man in mild distress, cooperative to examination.  Very anxious appearing and jittery.  Alert and oriented x3. HEENT:  Normocephalic, atraumatic.  TMs were normal bilaterally.   Mouth, no  erythema or exudates.  Moist mucous membranes. Pupils are equal, round, and reactive to light.  Extraocular movements intact.  Conjunctivae were normal.  No scleral icterus. NECK:  Supple.  Trachea midline.  Normal range of motion.  No JVD, mass, thyromegaly, or carotid bruits. CARDIOVASCULAR:  Regular rate and rhythm.  Normal S1, S2.  No murmurs, rubs, or gallops. ABDOMEN:  Soft, nontender, nondistended.  Bowel sounds were normal.  No masses or organomegaly. LUNGS:  Clear to auscultation bilaterally with no wheezes, rales, or rhonchi. GENITOURINARY:  No CVA tenderness. MUSCULOSKELETAL:  No joint deformities, erythema, or stiffness.  Full range of motion, nontender, joints. HEMATOLOGIC:  No cervical, inguinal, or axillary lymphadenopathy. NEUROLOGIC:  Strength normal and symmetric bilaterally.  Cranial nerves  II through XII grossly intact.  No focal motor deficits.  Sensory  intact to touch bilaterally. SKIN:  Warm, dry, and intact.  No rashes, cyanosis, or clubbing. PSYCHIATRIC:  Moderately anxious and jittery.  Affect very flat. Speech was non-rushed and thought processes were normal.  Judgment was poor, but thought content appeared to be normal.  Cognition and memory were intact.  ADMISSION LABORATORY DATA:  White blood cell count 7.4, hemoglobin 13.7, hematocrit 40.1, platelets 181.  Sodium 137, potassium 4.3, chloride  106, bicarb 26, BUN 23, creatinine 0.9, glucose 280, calcium 8.9.  AST  20, ALT 39, alk phos 119, bili 0.5, protein 5.9, albumin 3.9.  He had  2 sets of cardiac enzymes negative.  Tegretol level less than 2.  UA positive for greater than 1000 glucose, but negative for nitrate and  leukocytes.  Alcohol level was less than 5.  HOSPITAL COURSE: 1. Chest tightness.  Upon presentation, the chest tightness     was thought to be due to anxiety but because he had subtle T wave     changes on EKG he was admitted for an ACS rule out.  Cardiac      enzymes x3 were  negative.  During the hospitalization, he      complained of no chest pain, shortness of breath or diaphoresis.       It was therefore thought his tightness was secondary to anxiety.  2. Anxiety and bipolar disorder.  He clearly has anxiety issues that     need to be worked out by balancing his outpatient medications and     probably continuing therapy per Dr. Caprice Renshaw original plan.  He     denies any kind of homicidal or suicidal ideation.  On the date of     discharge, Dr. Donell Beers was contacted and medications were reviewed.  Mr. Eckerson was supposed to be on Visteril as an outpatient so it      was restarted. He will follow-up with Dr. Donell Beers at 9:30 Friday      morning.  3. Diabetes.  Mr. Sthilaire last A1c in April 2011 was 7.2.  It was     8.2 on admission.  He was kept on Lantus and followed a sliding      scale insulin.  He likely will need close management of his CBGs      with additional meal coverage.  He sees Dr. Cleta Alberts his PCP at      [facility].  4. Hyperlipidemia.  He was continued on a statin.  He will be      discharged on his home dose of statin.  Fasting lipid panel     revealed total cholesterol 105, triglycerides 54, HDL 38, LDL      56.  5. History of a lung nodule.  On admission, he was found to have had an     x-ray in April 2011, which showed a nodule in his left lung that was     thought to need follow up.  He never returned for follow-up.     Initial PA and lateral x-ray showed a similar nodule, so a CT scan      with contrast was ordered to rule out any malignancy.  The CT scan      was normal, without evidence of a pulmonary nodule.  The original     nodule was felt to be secondary to a bone callus on the sixth      lateral rib on the left.  DISCHARGE VITAL SIGNS:  Temperature 98.9, blood pressure 131/79, pulse 53,  respirations 18, O2 sat 99% on room air.   ______________________________ Leodis Sias,  MD   ______________________________ Doneen Poisson, MD   CP/MEDQ  D:  08/18/2010  T:  08/18/2010  Job:  [id number]  cc:   [facility] Archer Asa, M.D.  Electronically Signed by Leodis Sias MD on 08/20/2010 07:18:04 AM Electronically Signed by Doneen Poisson  on 08/25/2010 10:50:29 AM

## 2010-08-27 LAB — URINALYSIS, ROUTINE W REFLEX MICROSCOPIC
Bilirubin Urine: NEGATIVE
Glucose, UA: >1000 mg/dL — AB
Hgb urine dipstick: NEGATIVE
Specific Gravity, Urine: 1.037 — ABNORMAL HIGH (ref 1.005–1.030)
Urobilinogen, UA: 1 mg/dL (ref 0.0–1.0)
pH: 7 (ref 5.0–8.0)

## 2010-08-27 LAB — GLUCOSE, CAPILLARY
Glucose-Capillary: 120 mg/dL — ABNORMAL HIGH (ref 70–99)
Glucose-Capillary: 126 mg/dL — ABNORMAL HIGH (ref 70–99)
Glucose-Capillary: 163 mg/dL — ABNORMAL HIGH (ref 70–99)
Glucose-Capillary: 170 mg/dL — ABNORMAL HIGH (ref 70–99)
Glucose-Capillary: 186 mg/dL — ABNORMAL HIGH (ref 70–99)
Glucose-Capillary: 212 mg/dL — ABNORMAL HIGH (ref 70–99)
Glucose-Capillary: 216 mg/dL — ABNORMAL HIGH (ref 70–99)
Glucose-Capillary: 220 mg/dL — ABNORMAL HIGH (ref 70–99)
Glucose-Capillary: 222 mg/dL — ABNORMAL HIGH (ref 70–99)
Glucose-Capillary: 229 mg/dL — ABNORMAL HIGH (ref 70–99)
Glucose-Capillary: 237 mg/dL — ABNORMAL HIGH (ref 70–99)
Glucose-Capillary: 251 mg/dL — ABNORMAL HIGH (ref 70–99)
Glucose-Capillary: 307 mg/dL — ABNORMAL HIGH (ref 70–99)
Glucose-Capillary: 315 mg/dL — ABNORMAL HIGH (ref 70–99)
Glucose-Capillary: 254 mg/dL — ABNORMAL HIGH (ref 70–99)
Glucose-Capillary: 255 mg/dL — ABNORMAL HIGH (ref 70–99)
Glucose-Capillary: 277 mg/dL — ABNORMAL HIGH (ref 70–99)

## 2010-08-27 LAB — BASIC METABOLIC PANEL
BUN: 13 mg/dL (ref 6–23)
CO2: 19 mEq/L (ref 19–32)
CO2: 21 mEq/L (ref 19–32)
Calcium: 8.5 mg/dL (ref 8.4–10.5)
Calcium: 8.4 mg/dL (ref 8.4–10.5)
Calcium: 10.1 mg/dL (ref 8.4–10.5)
Chloride: 107 mEq/L (ref 96–112)
Creatinine, Ser: 0.94 mg/dL (ref 0.4–1.5)
Creatinine, Ser: 0.89 mg/dL (ref 0.4–1.5)
Creatinine, Ser: 0.9 mg/dL (ref 0.4–1.5)
GFR calc Af Amer: >60 mL/min (ref >60)
GFR calc Af Amer: >60 mL/min (ref >60)
GFR calc non Af Amer: >60 mL/min (ref >60)
Glucose, Bld: 320 mg/dL — ABNORMAL HIGH (ref 70–99)
Glucose, Bld: 253 mg/dL — ABNORMAL HIGH (ref 70–99)
Sodium: 134 mEq/L — ABNORMAL LOW (ref 135–145)
Sodium: 137 mEq/L (ref 135–145)

## 2010-08-27 LAB — CBC
HCT: 33.5 % — ABNORMAL LOW (ref 39.0–52.0)
Hemoglobin: 14.4 g/dL (ref 13.0–17.0)
MCHC: 35 g/dL (ref 30.0–36.0)
MCHC: 34 g/dL (ref 30.0–36.0)
MCV: 88.3 fL (ref 78.0–100.0)
Platelets: 160 10*3/uL (ref 150–400)
RBC: 4.81 MIL/uL (ref 4.22–5.81)
RDW: 13.6 % (ref 11.5–15.5)
WBC: 8.3 10*3/uL (ref 4.0–10.5)

## 2010-08-27 LAB — DIFFERENTIAL
Basophils Absolute: 0 10*3/uL (ref 0.0–0.1)
Basophils Relative: 0 % (ref 0–1)
Lymphocytes Relative: 26 % (ref 12–46)
Monocytes Absolute: 0.5 10*3/uL (ref 0.1–1.0)
Monocytes Relative: 6 % (ref 3–12)
Neutro Abs: 5.7 10*3/uL (ref 1.7–7.7)
Neutrophils Relative %: 68 % (ref 43–77)

## 2010-08-27 LAB — HEPATIC FUNCTION PANEL
Albumin: 3.4 g/dL — ABNORMAL LOW (ref 3.5–5.2)
Alkaline Phosphatase: 121 U/L — ABNORMAL HIGH (ref 39–117)
Indirect Bilirubin: 0.4 mg/dL (ref 0.3–0.9)
Total Protein: 5.2 g/dL — ABNORMAL LOW (ref 6.0–8.3)

## 2010-08-27 LAB — VITAMIN B12: Vitamin B-12: 955 pg/mL — ABNORMAL HIGH (ref 211–911)

## 2010-08-27 LAB — URINE MICROSCOPIC-ADD ON

## 2010-08-27 LAB — POCT CARDIAC MARKERS
CKMB, poc: <1 ng/mL — ABNORMAL LOW (ref 1.0–8.0)
Myoglobin, poc: 60.9 ng/mL (ref 12–200)
Troponin i, poc: <0.05 ng/mL (ref 0.00–0.09)

## 2010-08-27 LAB — FOLATE RBC: RBC Folate: 1117 ng/mL — ABNORMAL HIGH (ref 180–600)

## 2010-10-07 NOTE — H&P (Signed)
NAMERONALDO, CRILLY NO.:  [id number]   MEDICAL RECORD NO.:  [id number]          PATIENT TYPE:  INP   LOCATION:  3030                         FACILITY:  [facility]   PHYSICIAN:  Pearlean Brownie, M.D.DATE OF BIRTH:  June 03, 1950   DATE OF ADMISSION:  12/23/2007  DATE OF DISCHARGE:  12/25/2007                              HISTORY & PHYSICAL   PRIMARY CARE PHYSICIAN:  Dr. Cleta Alberts, [facility].   REASON FOR HOSPITALIZATION:  Dysphasia.   DISCHARGE DIAGNOSES:  1. Dysphasia of unknown etiology with a negative EGD.  2. Gastroesophageal reflux disease.  3. Cerebrovascular accident in 2005.  4. Possible history of coronary artery disease though the patient is      unable to elaborate.  5. Type 2 diabetes.  6. Hypertension.  7. Bipolar disorder with previous psychiatric inpatient admissions.  8. History of polysubstance abuse, now reportedly an admission      remission.   DISCHARGE MEDICATIONS:  No changes were made to the patient's medication  regimen.  1. Glipizide ER 10 mg p.o. daily.  2. Metformin 1000 mg p.o. daily.  3. Aggrenox 1 tablet p.o. b.i.d.  4. Fluoxetine 20 mg p.o. daily.  5. Lorazepam 1 mg p.o. b.i.d.  6. Adderall XR 20 mg 2 tablets q.a.m., 1 tablet at noon and 1 tablet      nightly.  7. Prevacid 30 mg p.o. b.i.d.  8. Lipitor 80 mg p.o. daily.  9. Seroquel 600 mg p.o. nightly.  10.Toprol-XL 25 mg p.o. nightly.  11.Oxcarbazepine 300 mg p.o. q.a.m. and 900 mg p.o. nightly.  12.Buspirone XL 150 mg p.o. daily.  13.Temazepam, dose unknown nightly.   PERTINENT LABS AND STUDIES:  All labs were stable during this  hospitalization.  HIV was negative.  UDS showed positive for  barbiturates, benzos, and opiates.  Cardiac markers were negative.  An  EGD performed by Dr. Dorena Cookey was essentially normal with evidence of  previous fundoplication and biopsies were taken that are pending at  discharge.   HOSPITAL COURSE:  Please see dictated H&P for  further details.  Briefly,  this is a 60 year old white male with a past medical history significant  for reflux and fundoplication who presented with dysphagia and what  appears to be approximately a 75-pound weight loss over the last 3 years  that was unintentional.  He was sent from his primary care's office for  persistent complaint of dysphasia and some significant anxiety  surrounding it.  On admission to the hospital, his vitals were all  stable as were his lab work.  GI was consulted and performed an EGD that  was essentially negative.  Biopsies were taken to rule out possibly  eosinophilia.  A bedside swallow study was negative.  The patient was  tolerating p.o. quite easily during the entire admission.  Upon further  discussion with the patient, he describes the dysphasia more as just  feeling as if phlegm is building up in his throat, but he does not have  any trouble swallowing.  He reports a good appetite.  The patient was  stable for discharge with followup with gastrointestinal  doctors to  further pursue other issues surrounding possible etiology of the  dysphagia including further motility studies.  Would also consider a  possible psych component given his odd behaviors and significant psych  history and inability to truly describe his dysphasia, or show any  symptoms while eating of difficulty.  All the patient's other medical  issues were stable during this hospitalization and no changes were made  to his medication regimen.   FOLLOWUP:  The patient is to follow with Dr. Dorena Cookey in 3-4 weeks.  He was given the telephone number to call to make that appointment.  The  patient is also to follow up with Dr. Andee Poles, his primary care physician  who could pursue further motility studies versus differing this to  gastrointestinal physician.      Lupita Raider, M.D.  Electronically Signed      Pearlean Brownie, M.D.  Electronically Signed    KS/MEDQ  D:   12/25/2007  T:  12/25/2007  Job:  [id number]

## 2010-10-07 NOTE — Consult Note (Signed)
Kyle [name], Kyle [name]            ACCOUNT NO.:  [id number]   MEDICAL RECORD NO.:  [id number]          PATIENT TYPE:  INP   LOCATION:  4735                         FACILITY:  [facility]   PHYSICIAN:  John C. Madilyn [name], M.D.    DATE OF BIRTH:  1950/08/06   DATE OF CONSULTATION:  12/23/2007  DATE OF DISCHARGE:                                 CONSULTATION   CHIEF COMPLAINT:  Reflux or odynophagia.   HISTORY OF ILLNESS:  The patient is a 60 year old white male who  presented yesterday with what he describes as progressive reflux.  He is  a somewhat difficult history, has manic depression.  After careful  questioning it does not sound like he has had an episode of food  impaction, but states his symptoms began worsening after eating a peanut  butter sandwich yesterday morning.  He has a regurgitant feeling in his  throat with some burning and feels like he cannot eat or swallow well.  He went to [facility] emergency room last night and was given an  empiric GI cocktail, some morphine, lorazepam and diazepam according to  another noted, but no diagnostic studies were done and he was released.  He returned back to his primary care physician with the same symptoms  today and was tachypneic and diaphoretic, and came back in and was  admitted   PAST MEDICAL HISTORY:  1. Gastroesophageal reflux with what sounds like a Nissen      fundoplication at [facility] in 2000.  2. Bipolar disorder.  3. Hypertension.  4. Type 2 diabetes.   HISTORY:  History of polysubstance abuse.   ALLERGIES:  NONE KNOWN.   MEDICATIONS:  Glipizide, metformin, Aggrenox, Prozac, lorazepam,  Adderall, Prevacid, Lipitor, Seroquel, Toprol XL BuSpar.   PHYSICAL EXAMINATION:  GENERAL:  A well-developed, well-nourished white  male looking somewhat distressed, able to swallow his saliva and  claiming to be able to swallow liquids.  HEENT:  No oral thrush or other abnormality seen.  HEART:  Regular rate and rhythm without  murmur.  ABDOMEN:  Soft, nondistended with normoactive bowel sounds.  No  hepatosplenomegaly, mass or guarding.   IMPRESSION:  Reflux, regurgitation, odynophagia, symptoms somewhat  difficult to sort out.   PLAN:  Will begin workup with esophagoscopy which will be done today or  tomorrow.          ______________________________  Kyle [name], M.D.    JCH/MEDQ  D:  12/23/2007  T:  12/23/2007  Job:  [id number]

## 2010-10-07 NOTE — H&P (Signed)
Kyle [name], GRANZOW NO.:  [id number]   MEDICAL RECORD NO.:  [id number]          PATIENT TYPE:  IPS   LOCATION:  0508                          [facility]ACILITY:  [facility]   PHYSICIAN:  Jasmine Pang, M.D. DATE OF BIRTH:  [birth year]   DATE OF ADMISSION:  06/02/2007  DATE OF DISCHARGE:                       PSYCHIATRIC ADMISSION ASSESSMENT   IDENTIFYING DATA:  A 60 year old divorced white male.  This is a  voluntary admission.   HISTORY OF PRESENT ILLNESS:  This 60 year old pleasant gentleman who is  well-known to [place], presented on referral by his psychotherapist, Thurston Pounds at [facility] outpatient clinic, for 1 week of increased agitation and  tearfulness.  Feeling that he could not cope and his therapist feared  that he would harm himself.  He had been having vague thoughts of  possibly overdosing on his medication.  He also has access to a gun at  home, although he says that he has never had thoughts of using it on  himself.  He has had increasing stress approaching a court date this  coming week for additional property settlements related to his divorce.  He separated from his wife 2 years ago and has been having considerable  financial stressors.  He is currently living on $1200 dollars per month,  but owes his wife part of the value of the house.  His inheritance from  his parents has decreased in value with the recent downturn; fears that  he will lose his housing.  Feels unable to cope with the future;  constantly getting anxious, becoming tearful and unable to be safe at  home.  Denies any substance abuse.  His medication compliance is not  clear.   PAST PSYCHIATRIC HISTORY:  The patient is followed by Dr. Milford Cage  and Dr. Debby Bud, MD his primary care physician.  He has a history of  bipolar disorder, attention deficit disorder and alcohol abuse,  currently in remission.  Last inpatient admission was July 19th to the  25th 2003.  He is currently followed as  an outpatient by Dr. Milford Cage.   SOCIAL HISTORY:  Divorced white male currently living alone.  Does have  at least 1 son in town.  Endorsing a lot of financial stressors.  Has a  history of previous substance abuse, but denying any IV drug use,  currently in remission from alcohol abuse for more than a year.  He is  on disability.   FAMILY HISTORY:  Family history is remarkable for an uncle who had  issues with anxiety and paranoia.   PAST MEDICAL HISTORY:  The patient is followed by Dr. Earl Lites, MD his  primary care Desirae Mancusi.  Medical problems include GERD, diabetes mellitus  type 2, hypertension.  York Spaniel he has a history of CVA times 2 and also  reflux disease.  Past medical history is also remarkable for CVAs with  left-sided weakness and a history of hernia repair.   CURRENT MEDICATIONS:  Current medications are taken from the patient's  list and some doses were validated with the pharmacy.  The patient is on  glipizide ER 10 mg p.o.  q.a.m., metformin 1000 mg b.i.d., Trileptal 300  mg in the morning and 900 mg at bedtime, Tums as needed for indigestion,  fluoxetine 20 mg every morning, lorazepam 1 mg p.o. q.i.d., Adderall 40  mg p.o. q.a.m. 20 mg at noon and 6:00 p.m., Aggrenox 200 mg p.o. b.i.d.,  Metanx 60 mg daily, Prevacid 30 mg at 12 noon and 6:00 p.m., ferrous  sulfate 325 mg at noon and 1800, Seroquel 900 mg at night, Singulair 10  mg q.h.s., Ambien 12.5 mg h.s. p.r.n. insomnia, Rozerem 8 mg q.h.s.,  vitamin D with calcium at bedtime, Lipitor 80 mg daily, Toprol XL 25 mg  daily and Abilify 10 mg daily.   ALLERGIES:  NO KNOWN DRUG ALLERGIES.  NO FOOD ALLERGIES KNOWN.   REVIEW OF SYSTEMS:  GENERAL:  Review of systems is remarkable for  pleasant cooperative gentleman with no acute distress.  CONSTITUTIONAL:  Denies any fever or chills.  Sleep has been broken with a lot of  increased anxiety, tearfulness 2to 3 times a day for the past 3 days and  increased stress and  worry for the last 2 weeks.  RESPIRATORY:  No  cough.  No chest pain.  He is denying any post nocturnal dyspnea.  CARDIAC:  He is denying any syncope and denying chest pain.  Denying  palpitations.  GASTROINTESTINAL:  Bowels are regular without laxatives.  Appetite is a bit poor but he is denying any weight loss.  NEUROLOGIC:  No dizziness.  No vertigo.  Denies any muscle stiffness, endorses some  agitation, worrying about his future.   PHYSICAL EXAMINATION:  VITAL SIGNS:  On physical exam, 5 feet 10 inches  tall, 171 pounds, temperature 98.2, pulse 73, respirations 18, blood  pressure 129/84.  HEAD:  Normocephalic and atraumatic.  EENT PERRL.  Extraocular movements are within normal limits.  Sclerae  are nonicteric.  NECK:  Supple.  No thyromegaly.  No lymphadenopathy.  CHEST:  Chest is clear to auscultation.  CARDIOVASCULAR:  S1, S2 are heard.  Regular rate, synchronous with  radial pulse.  No extra sounds.  BREASTS:  Exam deferred.  ABDOMEN:  Soft, nontender, nondistended.  GENITOURINARY:  Genitourinary is deferred.  EXTREMITIES:  No edema.  His gait is somewhat robotic.  He has a barely  perceptible weakness on the left side with muscle testing, but is  functioning well with a stable gait.  SKIN:  Skin is intact.  NEUROLOGIC:  Cranial nerves II through XII are intact.  He does have  some psychomotor slowing, no rigidity, but rather robotic slow,  psychomotor slowed look about him.  Arm swing is a bit limited.   DIAGNOSTIC STUDIES:  CBC WBC 6.6, hemoglobin 13.1, hematocrit 37.6 and  platelets 200,000.  Chemistry sodium 139, potassium 3.8, chloride 107,  carbon dioxide 28, BUN 17, creatinine 0.85, random glucose 96.  Liver  enzymes are within normal limits.  TSH 2.615.  CBGs have been running  from all 100 this morning and 216 this afternoon.   MENTAL STATUS EXAM:  Fully alert gentleman with some marked affect  flattening of psychomotor slowing.  He is cooperative, able to be   directed, feeling quite anxious.  Begins to shake and gets quite tearful  during the interview.  Very fearful about his future, expressing a lot  of hopelessness; mood is depressed.  Thought processes  logical.  No  evidence of internal distractions or psychosis.  No overtly delusional  thought.  Feeling very hopeless and also quite fearful about  the court  date that is coming up this week; and he seems unclear about exactly  what this is going to involve in terms of his property settlement.  Fears that ultimately he will lose his housing.  He is fully oriented to  person, place and situation.  In full contact with reality.   DIAGNOSES:  AXIS I:  Bipolar disorder depressed with attention deficit  by history.  Alcohol abuse in remission.  AXIS II:  Deferred.  AXIS III:  Diabetes mellitus type 2, asthma, hypertension, history of  CVA.  AXIS IV:  Severe financial issues.  AXIS V:  Current 38.  Past year 53.   PLAN:  Plan is to voluntarily admit the patient to alleviate his  suicidal thoughts.  We are going to stabilize him and attempt to  coordinate with his family, hear their concerns.  His medication  compliance is not clear.  He is on  multiple medications and we are planning to contact his primary care  physician to coordinate care.  At this point we are going to decrease  his Ativan to 1 mg in the morning and at bedtime and 0.5 at noon and  6:00 p.m.  The patient has voiced his agreement with the plan.      Margaret A. Lorin Picket, N.P.      Jasmine Pang, M.D.  Electronically Signed    MAS/MEDQ  D:  06/03/2007  T:  06/04/2007  Job:  [id number]

## 2010-10-07 NOTE — H&P (Signed)
Kyle [name], SEAR NO.:  [id number]   MEDICAL RECORD NO.:  [id number]          PATIENT TYPE:  INP   LOCATION:  4735                         FACILITY:  [facility]   PHYSICIAN:  Pearlean Brownie, M.D.DATE OF BIRTH:  06/22/1950   DATE OF ADMISSION:  12/23/2007  DATE OF DISCHARGE:                              HISTORY & PHYSICAL   PRIMARY CARE PHYSICIAN:  Brett Canales A. Cleta Alberts, MD, at the [facility].   CHIEF COMPLAINT:  Odynophagia.   HISTORY OF PRESENT ILLNESS:  This is a 60 year old male with past  medical history significant for GERD, who is presenting with  odynophagia.  He describes a constant esophageal pain that makes  swallowing difficult.  He is able to swallow solids and liquids, but  with difficulty.  He has a sensation of stomach contents coming up in  his throat, and this is not relieved by swallowing.  He has a 10-year  history of GERD and has previously had an EGD at [facility]; however, the  results are unavailable at this time, but per report the diagnosis was  simple GERD.  He did have surgical repair of a hiatal hernia at [facility] in 2000.  He presented yesterday to the [facility] ED and was  evaluated for dysphagia for which he was given a GI cocktail, IV  pantoprazole, IV morphine, lorazepam, and diazepam, none of which  provided relief.  The patient returned to his primary care physician  today with persistent complained of dysphagia and odynophagia and was  found to be diaphoretic and tachypneic with a respiratory rate of 30 in  the clinic.  He therefore sent to [facility] for  further evaluation.  On admission, the patient is neither diaphoretic or  tachypneic.   PAST MEDICAL HISTORY:  1. GERD.  He was evaluated by Dr. Adriana Simas at [facility] in the past      and also by [facility] here in [place] in the past.  2. CVA in 2005.  3. Possible history of CAD, though the patient is unable to elaborate      at  this point.  4. Diabetes mellitus type 2.  5  Hypertension.  6  Bipolar disorder with previous psychiatric inpatient admissions.  7  History of polysubstance abuse, now in remission.   FAMILY HISTORY:  Negative for cancer, disease of the esophagus, stomach,  small and large intestines, thyroid disease, CAD, or cerebrovascular  accident.  The daughter does have factor V Leiden.   SOCIAL HISTORY:  He has separated from his wife 3 years ago though she  is present with him on admission.  He has a remote smoking history.  He  quit in 1975 and has a 10 pack-year history.  He is a recovering  alcoholic and former polysubstance abuser.  He denies current substance  abuse.   ALLERGIES:  No known drug allergies.   MEDICATIONS:  1. Glipizide ER 10 mg p.o. daily.  2. Metformin 1000 mg p.o. daily.  3. Aggrenox 1 tab p.o. b.i.d.  4. Fluoxetine 20 mg p.o. daily.  5. Lorazepam 1  mg p.o. b.i.d.  6. Adderall XR 20 mg two tabs q.a.m., one tab at noon and one tablet      nightly.  7. Prevacid 30 mg p.o. b.i.d.  8. Lipitor 80 mg p.o. daily.  9. Seroquel 600 mg p.o. nightly.  10.Toprol-XL 25 mg p.o. nightly.  11.Oxcarbazepine 300 mg p.o. q.a.m. and 900 mg p.o. nightly.  12.Buspirone XL 150 mg p.o. daily.  13.Temazepam dose unknown nightly.   Review of systems is per HPI and positive for an unintentional 45 pound  weight loss over the last 3 years, decreased urine output over the past  24 hours, and negative for bright red blood per rectum, melena, nausea,  vomiting, diarrhea, constipation, abdominal pain, chest pain, shortness  of breath, headache, vision changes, fevers, chills, sweats, dysuria, or  urinary urgency.   PHYSICAL EXAM:  VITAL SIGNS:  Temperature 98.4, heart rate 84,  respirations 24, blood pressure 145/115, and oxygen saturation 100% on  room air.  CBG on admission is 188.  GENERAL:  He is alert and oriented in no acute distress and cooperative.  HEENT:  Moist mucous membranes  with no mass on visual examination of the  oropharynx or on examination of the external neck.  Extraocular muscles  are intact and pupils are equally round and reactive to light.  CARDIO:  Regular rate and rhythm with no murmurs, rubs, or gallops.  Radial pulses 2+ bilateral.  PULMONARY:  Clear to auscultation bilateral.  Normal work of breathing.  ABDOMEN:  Positive bowel sounds, nontender, and nondistended with no  hepatosplenomegaly.  EXTREMITIES:  Nontender without edema.  NEURO:  Cranial nerves II through XII are intact.   LABS AND RADIOLOGY:  None at this time.   ASSESSMENT AND PLAN:  This is a 60 year old male with odynophagia.  1. Odynophagia.  Given the long-standing GERD and unintended weight      loss, concern would be for Barrett esophagus or even neoplasm at      this time.  We will make the patient n.p.o. for now, give proton      pump inhibitor twice a day and sucralfate 4 times a day.  Guaiac      stools and consult GI for possible EGD.  2. Diabetes mellitus type 2.  We will hold oral hypoglycemics and      start sensitive sliding scale without nightly coverage or meal      coverage.  The patient is noted to be hypoglycemic to 188 on      admission.  3. Hypertension.  Continue the home dose of metoprolol.  He is mildly      hypertensive on admission.  4. Fluids, electrolytes, nutrition/gastrointestinal.  He does not      appear to be dehydrated on admission, but since he has no history      of congestive heart failure and reports decreased p.o. intake and      decreased urinary output over the past day, we will bolus him with      1000 mL of normal saline x1, then run maintenance IV fluids at D5      half normal saline at 120 mL per hour while he is n.p.o.  Keep him      n.p.o. pending workup, but can start carbohydrate-modified diet as      appropriate after his EGD.  5. Bipolar disorder.  Appears stable at this time.  Continue home      medications.  6.  Prophylaxis.  Early ambulation.  7. Disposition.  Pending workup of odynophagia.      Romero Belling, MD  Electronically Signed      Pearlean Brownie, M.D.  Electronically Signed    MO/MEDQ  D:  12/23/2007  T:  12/24/2007  Job:  [id number]

## 2010-10-07 NOTE — Op Note (Signed)
NAMEJETSON, PICKREL            ACCOUNT NO.:  [id number]   MEDICAL RECORD NO.:  [id number]          PATIENT TYPE:  INP   LOCATION:  3030                         FACILITY:  [facility]   PHYSICIAN:  John C. Madilyn Fireman, M.D.    DATE OF BIRTH:  [birth year]   DATE OF PROCEDURE:  12/24/2007  DATE OF DISCHARGE:                               OPERATIVE REPORT   INDICATIONS FOR PROCEDURE:  Dysphagia or reflux regurgitation in a  patient with history of Nissen fundoplication in the remote past.   SURGEON:  John C. Madilyn Fireman, MD   PROCEDURE:  The patient was placed in the left lateral decubitus  position and placed on pulse monitor with continuous low-flow oxygen  delivered by nasal cannula.  She was sedated with 75 mcg IV fentanyl and  7 mg IV Versed.  Olympus video endoscope was advanced under direct  vision into the oropharynx and esophagus.  The esophagus was basically  straight with normal caliber squamocolumnar line at 38 cm.  I do not  appreciate any mucosal abnormalities.  No evidence of infectious or  reflux esophagitis and no concentric rings.  There was no resistance to  passage of the scope through the GE junction and no procedure ring or  stricture.  The stomach was entered and small amount of liquid  secretions were suctioned from the fundus.  Retroflexed view of cardia  showed evidence of previous fundoplication and was otherwise  unremarkable.  The fundus, body, antrum, and pylorus all appeared  normal.  The duodenum was entered and both the bulb and second portion  were inspected and appeared to be within normal limits.  The scope was  withdrawn back into the esophagus and biopsies were taken to rule out  eosinophilic esophagitis.  The scope was then withdrawn and the patient  returned to the recovery room in stable condition.  He tolerated the  procedure well and there were no immediate complications.   IMPRESSION:  Essentially normal endoscopy with evidence of previous   fundoplication.   PLAN:  Await biopsy results and we will continue to treat with proton-  pump inhibitor and advance diet as tolerated.           ______________________________  Everardo All. Madilyn Fireman, M.D.     JCH/MEDQ  D:  12/24/2007  T:  12/25/2007  Job:  [id number]   cc:   Pearlean Brownie, M.D.

## 2010-10-10 NOTE — Discharge Summary (Signed)
[name], [name] NO.:  [id number]   MEDICAL RECORD NO.:  [id number]          PATIENT TYPE:  INP   LOCATION:  5504                         FACILITY:  [facility]   PHYSICIAN:  Kyle [name], D.O.    DATE OF BIRTH:  1950-10-22   DATE OF ADMISSION:  12/10/2005  DATE OF DISCHARGE:  12/11/2005                                 DISCHARGE SUMMARY   PRIMARY CARE PHYSICIAN:  Dr. Cleta [name] at [facility].  In addition,  patient had a primary care physician for many years.  Kyle [name] is anticipating  returning to be under his care.  Kyle [name] sees Dr. Elsie [name] as his cardiologist.  Kyle [name]  sees Dr. Pearlean [name] as his neurologist and Dr. Beverly [name] as his psychiatrist  in [place].   REASON FOR PRESENTATION:  Weakness.   PRINCIPAL DIAGNOSES:  1.  Transient episode of weakness of uncertain etiology.  Kyle [name] was ruled out      for an acute cerebrovascular accident by MRI of the brain today.      Suspect it may be as a result of one of his multiple medications.  Kyle [name]      describes ataxic type disturbance that has resolved at this time, though      Kyle [name] is on three medications which can perhaps cause this including      Abilify, Seroquel, and Trileptal.  I have asked him to follow up with      his primary psychiatrist, Dr. Lupita [name] so that these can be      addressed as potential side effects in the context of management of his      bipolar disorder as well as attention deficit disorder.  2.  Prior history of cerebrovascular accident in October 2005 with minimal      residual deficit.  3.  Diabetes mellitus type 2.  Assessment of control was good as his      hemoglobin A1c during this hospitalization was 6.1, elevated      triglycerides.  However, the lipid panel was not fasting.  Would suggest      repeating this once again in the outpatient setting and during a fasting      state.  4.  Bipolar disorder as described above.  5.  Asthma.  6.  Esophageal spasms.  7.  History of gastroesophageal  reflux disease status post Nissen      fundoplication.  8.  History of alcohol abuse, quit seven years ago.   DISCHARGE MEDICATIONS:  Kyle [name] is instructed to continue his  medications at home as Kyle [name] was on prior to arrival with the exception of  above to address specific medications through his psychiatrist that may be  affecting him.  Kyle [name] is to continue Glipizide 10 mg daily as before, metformin  1 g q.a.m., Trileptal 300 in the morning, 300 at bedtime as before, Cymbalta  60 mg daily, Ativan three times a day as needed, Adderall 20 mg two tablets  in the morning, one tablet at lunchtime, one tablet at dinner, Aggrenox  twice daily as before, Prevacid 30 mg daily, Lipitor 40 mg  daily, Toprol XL.  Kyle [name] does not know the dose; however, Kyle [name] is instructed to resume this dosage  at home.  Abilify 10 mg a day, multivitamin daily, iron sulfate 335 mg  daily, Singulair 10 mg at bedtime, Seroquel 900 mg nightly, calcium with  vitamin D daily.   HISTORY OF PRESENT ILLNESS:  For full details please refer to the H&P as  dictated by Dr. Lonia [name].  However, briefly, Kyle [name] is a pleasant 60-  year-old male with a history of lacunar CVAs who presented to [facility]  Emergency Room with complaints of weakness in balance and dizziness.  Kyle [name]  felt fine at time of evaluation and symptoms had resolved and was very vague  in any description.   HOSPITAL COURSE:  Kyle [name] underwent stroke evaluation.  Kyle [name] underwent  MRI study.  At the time of this dictation his homocysteine level is pending.  However, these results can be followed up by his primary care physician.  Kyle [name]  is on Metanx.  Kyle [name] hospital course was uneventful.  His clinical  examination was normal with the exception of some mild dysmetria on the  left.  His MRI was unremarkable in reference to acute findings.  At the time  Kyle [name] is felt to be medically stable for discharge and close follow-up with the  psychiatrist.  I have  encouraged him to establish a primary care physician  in addition with either Dr. Cleta [name] or Dr. Duaine [name].   LABORATORY DATA:  Hemoglobin A1c 6.1.  TSH 0.988.  Sodium 138, potassium  4.1, BUN 22, creatinine 0.8, glucose 137.  WBC 6.5, hemoglobin 13.6,  platelet count 241, MCV 93.  MRI revealed no acute CVA.      Kyle [name], D.O.  Electronically Signed     ESS/MEDQ  D:  12/11/2005  T:  12/11/2005  Job:  [id number]   cc:   Kyle [name], M.D.  Fax: [phone]   Kyle P. Kyle Brownie, MD  Fax: [phone]   Kyle [name], M.D.   Kyle [name], M.D.  Fax: [phone]
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
## 2010-10-10 NOTE — H&P (Signed)
NAMECHRISTIPHER, RIEGER NO.:  [id number]   MEDICAL RECORD NO.:  [id number]          PATIENT TYPE:  INP   LOCATION:  1846                         FACILITY:  [facility]   PHYSICIAN:  Lonia Blood, M.D.       DATE OF BIRTH:  1950/12/22   DATE OF ADMISSION:  12/10/2005  DATE OF DISCHARGE:                                HISTORY & PHYSICAL   PRIMARY CARE PHYSICIAN:  None.   CHIEF COMPLAINT:  Weakness.   HISTORY OF PRESENT ILLNESS:  Mr. Bas is a 60 year old man with a  history of lacunar CVAs who presented to [facility] Emergency Room today  with complaints of weakness in balance and dizziness.  The patient currently  feels fine and is feels like his symptoms have resolved.  He was not able to  point to any part of his body that was particularly weak, and he denies any  numbness.  He did not have any speech difficulties.   PAST MEDICAL HISTORY:  1.  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  2.  Diabetes mellitus type 2.  3.  Hyperlipidemia.  4.  Bipolar disorder.  5.  Adult onset attention deficit hyperactivity disorder.  6.  Aspirin.  7.  Esophageal spasm.  8.  Gastroesophageal reflux disease status post Nissen fundoplication  9.  History of heavy alcohol abuse, but the patient claims to have been      sober for about 11 years.   HOME MEDICATIONS:  1.  Glipizide 10 mg daily.  2.  Metformin 1000 mg in the morning.  3.  Trileptal 300 mg in the morning and 300 mg at bedtime.  4.  Cymbalta 60 mg daily.  5.  Ativan 1 mg three times a day.  6.  Adderall 20 mg to take 2 tablets in the morning, one tablet at lunch      time and one tablet at dinner.  7.  Aggrenox one capsule twice a day.  8.  Prevacid 30 mg daily.  9.  Lipitor 40 mg daily.  10. Toprol XL, unknown dose daily.  11. Abilify 10 mg at dinner.  12. Multivitamin daily.  13. Iron sulfate 325 mg daily.  14. Singulair 10 mg at bedtime.  15. Seroquel 900 mg at bedtime.  16. Calcium and vitamin D daily   SOCIAL  HISTORY:  The patient is separated, lives by himself.  He is on  disability.  He used to smoke cigarettes but quit 11 years ago.  He used to  drink heavy alcohol, quit 11 years ago.   FAMILY HISTORY:  The patient's father died with Alzheimer's disease.  Mother  has dementia.   REVIEW OF SYSTEMS:  As per HPI.  Otherwise, the patient denies   PHYSICAL EXAMINATION:  The site review of systems as per HPI.  Otherwise the  patient denies chest pain.  No nausea, no vomiting.  No headaches.  All  other systems are negative.   PHYSICAL EXAMINATION:  VITAL SIGNS:  Upon admission, temperature is 97.5,  blood pressure 129/78, pulse 70, respirations 20s, saturation 90% on room  air.  GENERAL APPEARANCE:  Well-nourished Caucasian man in no acute distress  sitting on the stretcher, alert, oriented to place, person and time.  HEENT:  Head is normocephalic, atraumatic.  Eyes: Pupils equal, round, react  to light accommodation.  Extraocular movements intact.  Conjunctiva pink.  Sclerae anicteric.  Throat clear.  Neck: Supple.  No JVD.  No carotid  bruits.  CHEST:  Clear to auscultation bilaterally without wheeze, rhonchi or  crackles.  HEART:  Regular rate and rhythm without murmurs, rubs or gallops.  ABDOMEN:  Soft, nontender, nondistended.  Bowel sounds are present.  No  hepatosplenomegaly.  SKIN:  Warm and dry without any suspicious rashes.  NEUROLOGIC:  Cranial nerves II-XII are intact.  Strength 5/5 in all four  extremities.  Sensation appears to be intact.  The cerebellum is intact.  The patient does display some tremors is his lower extremity and some mild  clonus in his lower extremities.  Deep tendon reflexes are symmetric.   LABORATORY VALUES:  On admission sodium 141, potassium 4.1, chloride 108,  BUN 20, creatinine 0.8.  White blood cell count 6.3, hemoglobin 13.3,  hematocrit 39.5, platelet count is 212,000.  PT 14.4, INR 1.1.  Alcohol less  than 5.  Urinalysis positive for  benzodiazepines.  EKG shows normal sinus  rhythm without any ST-T changes.  Head CT does not show any acute bleed.   ASSESSMENT/PLAN:  1.  Weakness in balance and dizziness, unclear etiology but in this patient      with a history of a lacunar cerebrovascular accident, we will need to      obtain an MRI of the brain to rule out lacunar cerebrovascular accident.      Plan is to admit the patient to the hospital, start intravenous fluids,      proceed with telemetry, neurological checks as well as resuming the      Aggrenox.  2.  Diabetes mellitus, type 2.  For now, we are going to hold the p.o.      medications, use insulin sliding scale while he is in the hospital/  3.  Bipolar disorder.  Will resume all this patient's psychiatric      medications without any changes.  4.  For DVT prophylaxis we will be using Lovenox.      Lonia Blood, M.D.  Electronically Signed     SL/MEDQ  D:  12/10/2005  T:  12/10/2005  Job:  [id number]

## 2010-10-10 NOTE — Discharge Summary (Signed)
NAMEMAYSON, Kyle [name] NO.:  [id number]   MEDICAL RECORD NO.:  [id number]          PATIENT TYPE:  IPS   LOCATION:  0508                          [facility]   PHYSICIAN:  Jasmine Pang, M.D. DATE OF BIRTH:  1950-11-26   DATE OF ADMISSION:  06/02/2007  DATE OF DISCHARGE:  06/06/2007                               DISCHARGE SUMMARY   IDENTIFYING INFORMATION:  This is a 60 year old divorced white male who  was admitted on a voluntary basis on June 02, 2007.   HISTORY OF PRESENT ILLNESS:  This 60 year old gentleman who was well  known to [place], presented on referral by his psychotherapist, Geanie Berlin, at the [facility] outpatient clinic.  She was concerned that he had had  1 week of increased agitation and tearfulness.  He was feeling he could  not cope and his therapist feared he would harm himself.  He had been  having vague thoughts of possibly overdosing on his medication.  He also  had access to guns at home, although he says that he never had thoughts  of using it on himself.  He has had increasing stress approaching a  court date this coming week for additional property settlements related  to his divorce.  He is separated from his wife 2 years ago and has been  having considerable financial stressors.  He is currently living on 1200  dollars per month, but owes his wife part of the value of the house.  An  inheritance from his parents has decreased in value with recent economic  downturn.  He fears he will lose his housing, feels unable to cope with  the future.  He is constantly getting anxious, becoming tearful and  unable to be safe at home.  Denies any substance abuse.  Medication  compliance is not clear.   PAST PSYCHIATRIC HISTORY:  The patient is followed by me and by Geanie Berlin, his therapist in the [facility] outpatient  clinic.  He has a history of bipolar disorder, attention deficit  disorder, and alcohol abuse, currently  in remission.  Last inpatient  admission was December 10, 2001, through December 16, 2001.   FAMILY HISTORY:  Family history is remarkable for an uncle who had  issues with anxiety and paranoia.   PAST MEDICAL HISTORY:  The patient was followed by Dr. Earl Lites, his  primary care Bristyn Kulesza.  Medical problems include GERD, diabetes mellitus  type 2 and hypertension.  He says that he has a history of CVA x2 with  left-sided weakness.   CURRENT MEDICATIONS:  The patient is on glipizide ER, metformin,  Trileptal, Tums p.r.n., fluoxetine, lorazepam, Adderall, Aggrenox,  Metanx, Prevacid, ferrous sulfate, Seroquel, Singulair, Ambien, Rozerem,  vitamin D with calcium, Lipitor, Toprol-XL, and Abilify daily.  For  dosages, see hospital course.   ALLERGIES:  No known drug allergies.  No food allergies known.   PHYSICAL FINDINGS:  A complete physical exam was done.  The patient had  no acute physical or medical problems noted.   DIAGNOSTIC STUDIES:  CBC revealed WBC of 6.6, hemoglobin 13.1,  hematocrit 37.6, and platelets 200,000.  Chemistry revealed a sodium of  139, potassium of 3.8, chloride of 107, carbon dioxide 28, BUN 17, and  creatinine 0.85.  Random glucose was 96.  Liver enzymes were within  normal limits.  TSH was 2.615, which was within normal limits.  CPKs  have been running from 100 in the morning to 216 in the afternoon.   HOSPITAL COURSE:  Upon admission, the patient was continued on his home  medications of Trileptal 900 mg p.o. q.h.s., Seroquel 900 mg p.o.  q.h.s., Singulair 10 mg p.o. q.h.s., Ambien CR 12.5 mg p.o. q.h.s., and  calcium with vitamin D q.h.s.  He was also restarted on his glipizide 10  mg p.o. daily, metformin 1000 mg p.o. q.a.m., Trileptal was increased to  300 mg q.a.m. and 900 mg q.h.s., lorazepam 1 mg q.i.d., Aggrenox 200 mg  b.i.d., fluoxetine 20 mg q.a.m., Prevacid 30 mg b.i.d., ferrous sulfate  325 mg b.i.d., multivitamin 1 daily, Abilify 10 mg at 1800,  Lipitor 80  mg daily, Rozerem 8 mg p.o. q.h.s. p.r.n. insomnia, Ambien CR 12.5 mg  p.o. q.h.s. p.r.n., and Singulair 10 mg daily.  On June 03, 2007, he  was started on Toprol-XL 25 mg p.o. q.a.m.  On June 03, 2007, the  patient's Ativan was discontinued and he was changed to Ativan 1 mg  q.a.m. and 1 mg q.h.s., 0.5 mg every noon, and 0.5 mg q.6 h. p.o.  The  patient tolerated medications well with no significant side effects.  He  was initially disheveled with minimal eye contact, psychomotor  retardation, speech soft and slow.  Mood was depressed and anxious.  Affect tearful.  As hospitalization progressed, he became calmer.  He  was not crying.  He began to feel more optimistic about his financial  future.  He found out that he was not in his dire straits as he has  thought he was.  On June 06, 2007, mental status had improved  markedly from admission status.  Sleep was good.  Appetite was good.  Mood was less depressed, less anxious.  Affect consistent with mood.  There was no suicidal or homicidal ideation.  No thoughts of self-  injurious behavior.  No auditory or visual hallucinations.  No paranoid  delusions.  Thoughts were logical and goal-directed.  Thought content,  no predominant theme.  Cognitive was grossly back to baseline.  It was  felt that the patient was able to be discharged today.   DISCHARGE DIAGNOSIS:  Axis I:  Bipolar disorder; depressed, severe  without psychosis; attention deficit disorder; and alcohol abuse in  remission.  Axis II:  Features of dependent personality disorder.  Axis III:  Diabetes mellitus type 2, asthma, hypertension, history of  cerebrovascular disorder.  Axis IV:  Severe (worries about financial issues, distraught about  divorce, feelings of abandonment by his family, burden of psychiatric  illness, and burden of medical problems).  Axis V:  Global assessment of functioning was 50 upon discharge, 38 upon  admission, highest past year  at 30.   DISCHARGE PLAN:  There were no specific activity level or dietary  restrictions.   POSTHOSPITAL CARE PLANS:  The patient will go to the [facility]  intensive outpatient program starting on June 07, 2007, at 8:45.   DISCHARGE MEDICATIONS:  Trileptal 900 mg at bedtime and 300 mg in the  a.m., Seroquel 900 mg at bedtime, Singulair 10 mg at bedtime, Glucotrol  10 mg daily, Glucophage 1000 mg  daily, Aggrenox as directed, fluoxetine  20 mg daily, Prevacid as directed, Abilify 10 mg daily, Lipitor 80 mg  daily as directed, Toprol-XL 25 mg daily, Ativan as directed at home,  Rozerem 8 mg p.o. q.h.s., Adderall XR 20 mg p.o. q.i.d.      Jasmine Pang, M.D.  Electronically Signed     BHS/MEDQ  D:  06/17/2007  T:  06/18/2007  Job:  [id number]

## 2010-10-10 NOTE — Procedures (Signed)
[place]  Patient:    Kyle [name], Kyle [name]                   MRN: [id number] Proc. Date: 09/11/99 Adm. Date:  [id number] Disc. Date: [id number] Attending:  Nelda Marseille CC:         Petra Kuba, M.D.             Carolyne Fiscal, M.D.             Clinton D. Maple Hudson, M.D.                           Procedure Report  PROCEDURE:  Twenty-four pH.  INDICATIONS:  Patient with persistent reflux symptoms.  Want to evaluate reflux, as well as, playing a role with some of his atypical symptoms.  PROCEDURE:  On September 09, 1999, the pH probe was placed in the endoscopy unit by one of the endoscopy nurses in the customary fashion and the computer generated pH report was submitted to me for my interpretation.  Patient did have some mild reflux with slightly more in the upright than in the supine position.  He had a  total number of episodes of 45, but normal is less than 50 and his total DeMeester score, which is the reflux index was 6.7 with a normal being less than 22.  His  symptom index only coordinated with reflux 1/5 times with coughing, but he did ave approximately 2/3 of other symptoms coordinating.  Although, the total number were minimal and he did not have much acid reflux with a pH greater than 4 the great  majority of the time.  IMPRESSION:  Some reflux, but not greater than the expected average.  PLAN:  Follow up with me to recheck symptoms and decided questionable further work-up and plans and will contact him to set this up. DD:  09/11/99 TD:  09/11/99 Job: [id number]
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
## 2010-10-10 NOTE — Discharge Summary (Signed)
NAMECHRISTOPHERJOHN, SCHIELE NO.:  [id number]   MEDICAL RECORD NO.:  [id number]          PATIENT TYPE:  INP   LOCATION:  3729                         FACILITY:  [facility]   PHYSICIAN:  Pearlean Brownie, M.D.DATE OF BIRTH:  May 20, 1951   DATE OF ADMISSION:  02/29/2004  DATE OF DISCHARGE:  03/03/2004                                 DISCHARGE SUMMARY   DISCHARGE DIAGNOSES:  1.  Cerebrovascular accident, left putamen infarct.  2.  Diabetes type 2.  3.  Hypertriglyceridemia.  4.  Bipolar disorder and depression.  5.  EKG changes.   DISCHARGE MEDICATIONS:  1.  Aggrenox one daily for 14 days, then b.i.d.  2.  Lipitor 40 mg p.o. daily.  3.  Glucotrol XL 10 mg one p.o. daily.  4.  Trileptal 300 mg one tablet q.a.m., three tablets q.h.s.  5.  Wellbutrin XL 150 mg three tablets q.a.m.  6.  Clonazepam 1 mg t.i.d. and q.h.s.  7.  Adderall 20 mg two tablets q.a.m., one tablet q.lunch, one tablet at      dinner.  8.  Prevacid 30 mg b.i.d.  9.  Nitroglycerin t.i.d.  10. Cymbalta 120 mg two tablets b.i.d.  11. Singulair 10 mg q.h.s.  12. Ambien 10 mg one to two q.h.s.  13. Seroquel 300 mg four tablets q.h.s.  14. Albuterol MDI p.r.n.  15. Metformin 500 mg p.o. b.i.d. with meals.   HISTORY:  This is a 60 year old white male with a history of bipolar  disorder, ADHD, hypertension who presented to urgent care on day of  admission complaining of right-sided weakness, ataxic gait, and periodic  slurred speech that started the evening prior.  His wife had noticed his  right leg dragging when he walked as well as increased clumsiness and right  ________________ last few weeks.  On admission he denied headache, blurred  vision, chest pain, shortness of breath, or falls.  Went to bed evening  prior to admission, took his home medications, and morning of admission  ended up at urgent care and was sent to the ED to rule out stroke.  EKG that  was performed showed old versus new  ___________.  Patient was evaluated by  neurology for a normal head CT and left putamen CVA found on CT.   ADMISSION LABORATORIES:  White count 6.2, hemoglobin 13.9, hematocrit 38.9,  platelets 261.  Sodium 138, potassium 4.5, chloride 106, bicarbonate 27, BUN  16, creatinine 1.0, glucose 285.  AST 17, ALT 20, alkaline phosphatase 126,  total bilirubin 0.5.  Myoglobin 71.2, CK-MB less than 1.0, troponin less  than 0.05.  CT of head showed left putamen infarct, indeterminate age.   HOSPITAL COURSE:  #1 - CEREBROVASCULAR ACCIDENT:  Patient was admitted for  CVA.  Given ____________, later changed to Aggrenox.  Patient also given  PT/OT rehabilitation.  Over time strength returned to both upper and lower  extremities.  On discharge patient still needing walker for stability due to  weakness of his right lower extremity.  Slurred speech had greatly improved.  Patient had carotid Dopplers.  Results are still pending at time of  discharge.  Patient will have physical therapy rehabilitation as an  outpatient.   #2 - DIABETES MELLITUS TYPE 2:  Hemoglobin A1C on admission was 9.4.  While  in hospital patient was started on Glucotrol XL.  There was some question  whether psychiatric medicines were contributing to the hyperglycemia.  Patient was also given prescription for Metformin on discharge.  He has been  instructed to continue monitoring his Accu-Cheks every morning and evening.  Also given instructions for following up as an outpatient with diabetes  management/nutrition.   #3 - HYPERTRIGLYCERIDEMIA AND INCREASED LDL:  Patient was started on  Lipitor.  His LDL was 137 and total cholesterol was 235.  Patient will need  a follow-up after discharge with his primary care physician.   #4 - BIPOLAR DISEASE AND DEPRESSION:  The patient is to continue on his home  medications and should follow up with his psychiatrist, Dr. Katrinka Blazing, as  needed.   #5 - EKG CHANGES:  Given that cardiac enzymes were  negative on admission and  he denied chest pain, shortness of breath, will need to follow up as an  outpatient with cardiology for further evaluation.   FOLLOWUP ITEMS:  1.  Low cholesterol diabetic diet.  2.  Physical therapy as an outpatient.  3.  Diabetes education.  4.  Follow up with cardiology as an outpatient.  5.  Carotid Doppler results.       TH/MEDQ  D:  03/03/2004  T:  03/04/2004  Job:  [id number]   cc:   Jasmine Pang, M.D.  Fax: [phone]   Pramod P. Pearlean Brownie, MD  Fax: [phone]
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
## 2010-10-10 NOTE — H&P (Signed)
NAMEDUANE, EARNSHAW NO.:  [id number]   MEDICAL RECORD NO.:  [id number]          PATIENT TYPE:  INP   LOCATION:  3729                         FACILITY:  [facility]   PHYSICIAN:  Seymour Bars, D.O.      DATE OF BIRTH:  [birth year]   DATE OF ADMISSION:  02/29/2004  DATE OF DISCHARGE:                                HISTORY & PHYSICAL   CHIEF COMPLAINT:  Right-sided weakness.   HISTORY OF PRESENT ILLNESS:  A 60 year old white male with history of  bipolar disorder, ADHD, and hypertension who presented to Urgent Care  complaining of right-sided weakness, ataxic gait, and periodic slurred  speech that started the evening prior. His wife noticed right leg dragging  with his walk and increased clumsiness yesterday. His handwriting has  deteriorated for the last few weeks. He denies any headache, blurred vision,  chest pain, shortness of breath, or fall. On going to bed last night he took  his regular home medications given by his wife. This morning he went to  Urgent Care for his continued ataxic gait and right-sided weakness, and was  sent to the emergency department  for rule out stroke.  EKG performed also  showed old versus new T-wave inversion. The patient was evaluated by  neurology for an abnormal head CT on initial evaluation while in the ED  which showed a left putamen stroke.  Nyal denies any dysphagia, tremors,  or increased sedation.   PERTINENT REVIEW OF SYSTEMS:  CONSTITUTIONAL: Denies fever, chills; however  had a five pound weight loss over the last two months. CARDIOVASCULAR:  Denies chest pain or palpitations. GI: No nausea, vomiting, or diarrhea.  Positive history of esophageal spasm when she takes nitroglycerin.  GENITOURINARY: Denies any urinary incontinence. NEUROLOGIC: See the above  changes. RESPIRATORY: No shortness of breath.   PAST MEDICAL HISTORY:  1.  Bipolar disorder with inpatient behavioral health stay.  2.  Involuntary commitment for a  time other than August 2003.  3.  Behavior admission in 2000.  4.  Outpatient care by Dr. Milford Cage.  5.  ADHD.  6.  Hypertension.  7.  Asthma.  8.  Esophageal spasm, followed by GI at [facility] in [place].   PAST SURGICAL HISTORY:  1.  Lap Nissen For hiatal hernia.  2.  Right foot joint replacement.  3.  Deviated septum repair.   MEDICATIONS:  1.  Trileptal 300 mg one tab q.a.m., three tabs q.h.s.  2.  Wellbutrin XL 150 gm three tabs q.a.m.  3.  Clonazepam 1 mg t.i.d. and q.h.s.  4.  Adderall 20 mg two tabs q.a.m. and one tab at dinner.  5.  Prevacid 30 mg b.i.d.  6.  Nitroglycerin 0.3 ? mg t.i.d.  7.  Cymbalta 120 mg two tabs b.i.d.  8.  Singulair 10 mg q.h.s.  9.  Ambien 10 mg one or two tabs q.h.s.  10. Seroquel 300 mg four tabs q.h.s.  11. Albuterol MDI p.r.n.   ALLERGIES:  No known drug allergies.   SOCIAL HISTORY:  Previous tobacco history of ten years. Previous substance  abuse.  Denies any IV drug use. Quit alcohol with AA since 1996. Has two  children, married, on disability.   FAMILY HISTORY:  Father deceased with hip fractures and Alzheimer disease.  Mother living at 10 with dementia and questionable MI. Sister with  hypertension and CVA in her early 108s and high cholesterol.   PHYSICAL EXAMINATION:  VITAL SIGNS: Temperature 98.3, pulse 68, respirations  24, blood pressure 149/90. O2 saturation 100% on room air. CBG 395 per EMS.  GENERAL: Lying in bed, cooperative, and in no acute distress.  HEENT: Normocephalic and atraumatic. Pupils equal, round, and reactive.  Extraocular movements intact. Oropharynx clear, moist mucous membranes.  CARDIOVASCULAR: Regular rate and rhythm without murmurs. No JVD. No carotid  bruits auscultated. 2+ dorsalis pedis on the left and able to Doppler a good  pulse on the right, dorsalis pedis.  LUNGS: Clear to auscultation bilaterally, nonlabored.  ABDOMEN: Soft, nontender, nondistended. Normoactive bowel sounds.  No  hepatosplenomegaly.  EXTREMITIES: Right foot cool; 1+ pulse. Capillary refill less than 2  seconds. Skin without rash. The left knee with a 3 cm scab.  NEUROLOGIC: Cranial nerves II through XII are intact. 2/4 deep tendon  reflexes in patella and brachial radialis. He has 5/5 grip strength of the  upper extremities. He has 4/5 lower extremity strength and +5/5 left lower  extremity strength. Normal finger to nose test, normal rapid alternating  hand test. Negative Babinski. No clonus. Romberg test performed without any  stepping, but he was steady. On gait exam, he drags his right leg and is  very unsteady.  He has negative pronator drift and no asterixis.   LABORATORY DATA:  CBC show white count of 6.2, hemoglobin 13.9, hematocrit  38.5, platelet count 261,000. CMET shows a sodium of 138, potassium 4.5,  chloride 106, bicarbonate 27, BUN 16, creatinine 1.0, glucose 285, calcium  9.2, AST 17, ALT 20, alkaline phosphatase 126, total bilirubin 0.5.  Myoglobin 71.2, MB is less than 1.0, troponin-I less than 0.05.   CT of the head shows a left putamen infarct of indeterminate age. EKG shows  a normal sinus rhythm with heart rate of 80, normal axis, T-wave inversion  in leads I, aVL, V1 through V6.   ASSESSMENT/PLAN:  This is a 60 year old white male with new onset right-  sided weakness, ataxia, and slurred speech.  1.  Cerebrovascular accident. Likely CVA and also shown on head CT.  He has      already been seen by Dr. Orlin Hilding of neurology who suggest that the      findings on head CT could correlate with his current symptoms. They      suggest not putting him on heparin due to his onset about 18 hours ago.      We did order an MRI/MRA of the head and neck, fasting lipid panel,      fasting homocysteine, and echo, and will place him on aspirin for now.      He will likely need PT and OT for gait abnormalities.  2.  Ataxia consistent with putamen infarct. Will check neuro checks  every     four hours overnight and will need PT, OT, and ST for weakness.  3.  Hypertension. Will tolerate increased blood pressure for now as we want      to insure maintenance of cerebral perfusion. Will closely monitor him.      He has a history of hypertensive urgency and has been evaluated with  urine VMA which was negative.  4.  Hyperglycemia with glucose of 285. May have underlying diabetes also at      risk given his atypical antipsychotics. Will check a hemoglobin A1C,      place him on a diabetic diet without D-5 IV fluids. May need to start on      oral hypoglycemics.  5.  Electrocardiogram changes. T-wave inversion in the lateral leads seen      without old EKG for comparison. He may need outpatient follow-up.  6.  Psychotropic medications, history of bipolar disorder, and depression on      increased dose of antipsychotic, antidepressant medications.  His new      dosing is stable now, although we did look his high doses of medications      which some are above the recommended dosages. He has had psychotic      breaks without these medications. Would like him to follow with      psychiatry if he is having  more sedation with his home doses.  7.  Previous substance abuse. History of polysubstance abuse, reporting      abstinence with AA about 10 years ago. Currently stable.       KB/MEDQ  D:  03/01/2004  T:  03/01/2004  Job:  [id number]   cc:   [facility]   Jasmine Pang, M.D.  Fax: [phone]
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
## 2010-10-10 NOTE — Procedures (Signed)
[facility]  Patient:    Kyle [name], Kyle [name]                   MRN: [id number] Proc. Date: 11/20/99 Adm. Date:  [id number] Attending:  Nelda Marseille CC:         Carolyne Fiscal, M.D.             Lorne Skeens. Hoxworth, M.D.             Clinton D. Maple Hudson, M.D.                           Procedure Report  PROCEDURE:  EGD with biopsy.  INDICATIONS:  Patient with ear, nose, and throat symptoms.  Want to rule-out reflux as a cause.  INFORMED CONSENT:  Consent was signed after risks, benefits, methods, and options were thoroughly discussed in the office on multiple occasions.  MEDICATIONS USED:  Demerol 50 and Versed 6.  DESCRIPTION OF PROCEDURE:  The video endoscope was inserted by direct vision. The esophagus was essentially normal.  No signs of significant reflux changes. Scattered biopsies of distal and mid esophagus were obtained at the end of the procedure.  The scope was passed into the stomach and advanced through the antrum and pertinent for some minimal antritis, through a normal pylorus, into a normal duodenal bulb, and around the C-loop, where just above the ampulla was a small, white patch, which was cold biopsied x 3.  The rest of the second part of the duodenum was normal.  The scope was withdrawn back to the bulb, which was normal.  The scope was withdrawn back to the stomach and retroflexed.  High in the cardia, the previous surgical changes were observed. The fundus was normal.  The stomach was evaluated on retroflex and then straight visualization.  Other than some mild gastritis, no other abnormalities were seen.  A few biopsies of the gastritis were obtained and put in the third container.  The esophageal biopsies were obtained prior to this and put in the second container.  Air was suctioned.  The scope was slowly withdrawn.  Again, a good look at the esophagus on slow withdrawal revealed it to be slightly tortuous, but  again, no significant reflux changes, signs of Barretts, or other abnormalities.  The scope was removed. The patient tolerated the procedure well.  There were no obvious or immediate complications.  ENDOSCOPIC DIAGNOSES: 1. A tortuous esophagus without obvious reflux change, status post biopsy. 2. Hiatal hernia repair. 3. Minimal gastritis and antritis, status post biopsy. 4. Tiny, second portion of the duodenum with white, nodularity, status post    biopsy. 5. Otherwise within normal limits of EGD.  PLAN:  Will add Reglan to his b.i.d. Protonix.  If no better in two weeks, call for an ENT consult.  Different than Dr. Jearld Fenton or one of his partners, since he would like a second opinion.  Happy to see back p.r.n., and otherwise, follow up in three months. DD:  11/20/99 TD:  11/21/99 Job: [id number]

## 2010-10-10 NOTE — Consult Note (Signed)
NAMEEMRIK, ERHARD NO.:  [id number]   MEDICAL RECORD NO.:  [id number]          PATIENT TYPE:  EMS   LOCATION:  MAJO                         FACILITY:  [facility]   PHYSICIAN:  Gustavus Messing. Orlin Hilding, M.D.DATE OF BIRTH:  Feb 25, 1951   DATE OF CONSULTATION:  02/29/2004  DATE OF DISCHARGE:                                   CONSULTATION   CONSULTING PHYSICIAN:  Santina Evans A. Orlin Hilding, M.D.   CHIEF COMPLAINT:  Right sided weakness and dysarthria.   HISTORY OF PRESENT ILLNESS:  Mr. Swindell is a 60 year old right handed  white male who is primarily effected by significant bipolar disorder with  psychotic features and ADHD.  Yesterday he had some dysarthria and language  disturbance.  There was some hesitation.  Today he was noted to be dragging  his right leg and also complained that his right arm was weak and has  generally been clumsy.  He also has an abdominal EKG.   REVIEW OF SYSTEMS:  Negative for chest pain, shortness of breath, headache,  vision changes, or numbness.   PAST MEDICAL HISTORY:  1.  Severe bipolar with psychotic features.  2.  ADHD.  3.  Asthma.  4.  Allergies.  5.  Hiatal hernia.  6.  Reflux and esophageal spasm.  7.  He is status post some right foot surgery.  He had surgery for his      hiatal hernia also.   MEDICATIONS:  Chiefly psychiatric including Trileptal, Wellbutrin,  clonazepam, Adderall, Cymbalta, Seroquel, Ambien.  He also has Prevacid and  Nitrotab for his esophageal spasm and reflux and Singulair for his  allergies.   ALLERGIES:  No known drug allergies.   SOCIAL HISTORY:  He is married, remote cigarette use, no alcohol.   FAMILY HISTORY:  Negative for stroke.   PHYSICAL EXAMINATION:  VITAL SIGNS:  Temperature is 98.3, respirations 24,  BP 149/90, pulse is 68, 100% sat on room air.  HEAD:  Normocephalic, atraumatic.  NECK:  Supple without bruits.  HEART:  Regular rate and rhythm.  EXTREMITIES:  Without edema.  NEUROLOGIC:   Mental status:  He is awake and alert, mildly anxious.  Normal  language.  Normal naming, comprehension, and repetition.  Cranial nerves:  Pupils are equal and reactive.  Visual fields are full.  Extraocular  movements are intact.  Facial sensation is normal.  Facial __________  normal.  Hearing is intact.  Palate is symmetric and tongue is midline.  He  is mildly dysarthric.  Motor:  He has a definite right pronator drift in the  upper extremities with normal rapid fine movements.  No satelliting and  normal bulk, tone, and strength throughout.  He has mild right leg weakness  as well.  He has slightly brisk reflexes on the right compared to the left  but downgoing toes bilaterally.  Coordination:  Finger-to-nose is intact  bilaterally.  Heel-to-shin is mildly dysmetric bilaterally.  Sensation is  intact.   CT scan of the brain shows what looks like a subacute infarct in the left  putamen.   LABS:  Unremarkable except for a glucose of 285  without any known history of  diabetes.   IMPRESSION:  Probable subacute left __________  infarction in the setting of  hyperglycemia, not known to be a diabetic and an abnormal electrocardiogram  with some T wave inversions.   RECOMMENDATIONS:  1.  Check MRI scan of the brain, MRA of the head without and neck with.  2.  Fasting lipid panel and homocystine leve.  3.  Echocardiogram.  4.  Carotid ultrasound.  5.  Hemoglobin A1c.  6.  PT, OT, and ST evaluations.       CAW/MEDQ  D:  02/29/2004  T:  03/01/2004  Job:  [id number]

## 2010-11-12 ENCOUNTER — Other Ambulatory Visit: Payer: Self-pay | Admitting: Emergency Medicine

## 2010-11-12 DIAGNOSIS — M25531 Pain in right wrist: Secondary | ICD-10-CM

## 2010-11-13 ENCOUNTER — Ambulatory Visit
Admission: RE | Admit: 2010-11-13 | Discharge: 2010-11-13 | Disposition: A | Discharge status: Home / Self Care | Payer: Medicare Other | Source: Ambulatory Visit | Attending: Emergency Medicine | Admitting: Emergency Medicine

## 2010-11-13 DIAGNOSIS — M25531 Pain in right wrist: Secondary | ICD-10-CM

## 2011-02-11 LAB — DRUGS OF ABUSE SCREEN W/O ALC, ROUTINE URINE
Cocaine Metabolites: NEGATIVE
Opiate Screen, Urine: NEGATIVE
Phencyclidine (PCP): NEGATIVE
Propoxyphene: NEGATIVE

## 2011-02-11 LAB — COMPREHENSIVE METABOLIC PANEL
ALT: 23
Alkaline Phosphatase: 99
BUN: 17
CO2: 28
GFR calc non Af Amer: >60
Glucose, Bld: 96
Potassium: 3.8
Sodium: 139
Total Bilirubin: 0.6

## 2011-02-11 LAB — URINALYSIS, ROUTINE W REFLEX MICROSCOPIC
Glucose, UA: NEGATIVE
Ketones, ur: NEGATIVE
Protein, ur: NEGATIVE
Urobilinogen, UA: 1

## 2011-02-11 LAB — TSH: TSH: 2.615

## 2011-02-11 LAB — CBC
HCT: 37.6 — ABNORMAL LOW
Hemoglobin: 13.1
RBC: 4.15 — ABNORMAL LOW

## 2011-02-20 LAB — BASIC METABOLIC PANEL
Calcium: 8.3 — ABNORMAL LOW
GFR calc Af Amer: >60
GFR calc Af Amer: >60
GFR calc non Af Amer: >60
GFR calc non Af Amer: >60
Potassium: 3.5
Potassium: 3.8
Sodium: 136
Sodium: 138

## 2011-02-20 LAB — CBC
HCT: 39.3
Hemoglobin: 16.4
MCHC: 34
MCV: 91.9
Platelets: 217
Platelets: 304
RDW: 13.3
RDW: 13.4
WBC: 7.5
WBC: 11.8 — ABNORMAL HIGH

## 2011-02-20 LAB — BENZODIAZEPINE, QUANTITATIVE, URINE
Alprazolam (GC/LC/MS), ur confirm: NEGATIVE
Flurazepam GC/MS Conf: NEGATIVE

## 2011-02-20 LAB — DIFFERENTIAL
Basophils Absolute: 0
Basophils Absolute: 0
Eosinophils Relative: 0
Lymphocytes Relative: 23
Lymphocytes Relative: 16
Lymphs Abs: 1.7
Lymphs Abs: 1.9
Monocytes Absolute: 0.7
Monocytes Absolute: 0.8
Monocytes Relative: 10
Neutro Abs: 5
Neutro Abs: 9 — ABNORMAL HIGH

## 2011-02-20 LAB — URINE DRUGS OF ABUSE SCREEN W ALC, ROUTINE (REF LAB)
Amphetamine Screen, Ur: NEGATIVE
Barbiturate Quant, Ur: POSITIVE — AB
Creatinine,U: 303.9
Methadone: NEGATIVE
Phencyclidine (PCP): NEGATIVE
Propoxyphene: NEGATIVE

## 2011-02-20 LAB — POCT I-STAT, CHEM 8
BUN: 22
Chloride: 107
Creatinine, Ser: 1.2
Sodium: 140

## 2011-02-20 LAB — OPIATE, QUANTITATIVE, URINE
Codeine Urine: NEGATIVE ng/mL
Hydromorphone GC/MS Conf: NEGATIVE ng/mL
Oxymorphone: NEGATIVE ng/mL

## 2011-02-20 LAB — COMPREHENSIVE METABOLIC PANEL
AST: 24
Albumin: 3.6
BUN: 23
Calcium: 8.7
Creatinine, Ser: 1.01
GFR calc Af Amer: >60
Total Bilirubin: 1.4 — ABNORMAL HIGH
Total Protein: 5.3 — ABNORMAL LOW

## 2011-02-20 LAB — POCT CARDIAC MARKERS
CKMB, poc: <1 — ABNORMAL LOW
Myoglobin, poc: 74.2

## 2011-02-20 LAB — BARBITURATE, URINE, CONFIRMATION: Amobarbital UR Quant: NEGATIVE

## 2011-02-20 LAB — GLUCOSE, CAPILLARY

## 2011-03-03 LAB — COMPREHENSIVE METABOLIC PANEL
Albumin: 3.9
Alkaline Phosphatase: 122 — ABNORMAL HIGH
BUN: 21
CO2: 25
Chloride: 108
Glucose, Bld: 281 — ABNORMAL HIGH
Potassium: 3.3 — ABNORMAL LOW
Total Bilirubin: 0.9

## 2011-03-03 LAB — CBC
HCT: 39.3
Hemoglobin: 13.6
Platelets: 206
RBC: 4.32
WBC: 5

## 2011-03-03 LAB — DIFFERENTIAL
Basophils Absolute: 0
Basophils Relative: 0
Monocytes Absolute: 0.7
Neutro Abs: 3.2

## 2011-03-03 LAB — URINALYSIS, ROUTINE W REFLEX MICROSCOPIC
Glucose, UA: 250 — AB
Hgb urine dipstick: NEGATIVE
Ketones, ur: 15 — AB
Protein, ur: 30 — AB

## 2011-04-02 ENCOUNTER — Encounter: Payer: Self-pay | Admitting: Emergency Medicine

## 2011-04-02 DIAGNOSIS — W3400XA Accidental discharge from unspecified firearms or gun, initial encounter: Secondary | ICD-10-CM | POA: Insufficient documentation

## 2011-04-02 DIAGNOSIS — E782 Mixed hyperlipidemia: Secondary | ICD-10-CM | POA: Insufficient documentation

## 2011-04-02 DIAGNOSIS — F319 Bipolar disorder, unspecified: Secondary | ICD-10-CM | POA: Insufficient documentation

## 2011-04-02 DIAGNOSIS — I639 Cerebral infarction, unspecified: Secondary | ICD-10-CM | POA: Insufficient documentation

## 2011-04-02 DIAGNOSIS — I2510 Atherosclerotic heart disease of native coronary artery without angina pectoris: Secondary | ICD-10-CM | POA: Insufficient documentation

## 2011-04-02 DIAGNOSIS — E119 Type 2 diabetes mellitus without complications: Secondary | ICD-10-CM | POA: Insufficient documentation

## 2011-04-02 DIAGNOSIS — F988 Other specified behavioral and emotional disorders with onset usually occurring in childhood and adolescence: Secondary | ICD-10-CM | POA: Insufficient documentation

## 2011-04-20 ENCOUNTER — Emergency Department (HOSPITAL_BASED_OUTPATIENT_CLINIC_OR_DEPARTMENT_OTHER)
Admission: EM | Admit: 2011-04-20 | Discharge: 2011-04-20 | Disposition: A | Discharge status: Home / Self Care | Payer: Medicare Other | Attending: Emergency Medicine | Admitting: Emergency Medicine

## 2011-04-20 ENCOUNTER — Encounter (HOSPITAL_BASED_OUTPATIENT_CLINIC_OR_DEPARTMENT_OTHER): Payer: Self-pay

## 2011-04-20 DIAGNOSIS — W06XXXA Fall from bed, initial encounter: Secondary | ICD-10-CM | POA: Insufficient documentation

## 2011-04-20 DIAGNOSIS — E119 Type 2 diabetes mellitus without complications: Secondary | ICD-10-CM | POA: Insufficient documentation

## 2011-04-20 DIAGNOSIS — S0190XA Unspecified open wound of unspecified part of head, initial encounter: Secondary | ICD-10-CM | POA: Insufficient documentation

## 2011-04-20 DIAGNOSIS — Y92009 Unspecified place in unspecified non-institutional (private) residence as the place of occurrence of the external cause: Secondary | ICD-10-CM | POA: Insufficient documentation

## 2011-04-20 DIAGNOSIS — F319 Bipolar disorder, unspecified: Secondary | ICD-10-CM | POA: Insufficient documentation

## 2011-04-20 DIAGNOSIS — E785 Hyperlipidemia, unspecified: Secondary | ICD-10-CM | POA: Insufficient documentation

## 2011-04-20 DIAGNOSIS — S0191XA Laceration without foreign body of unspecified part of head, initial encounter: Secondary | ICD-10-CM

## 2011-04-20 DIAGNOSIS — I2510 Atherosclerotic heart disease of native coronary artery without angina pectoris: Secondary | ICD-10-CM | POA: Insufficient documentation

## 2011-04-20 DIAGNOSIS — Z8679 Personal history of other diseases of the circulatory system: Secondary | ICD-10-CM | POA: Insufficient documentation

## 2011-04-20 DIAGNOSIS — Z79899 Other long term (current) drug therapy: Secondary | ICD-10-CM | POA: Insufficient documentation

## 2011-04-20 NOTE — ED Notes (Signed)
Pt states he fell OOB striking right brow on nightstand.  Denies LOC.
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
## 2011-04-20 NOTE — ED Provider Notes (Signed)
Medical screening examination/treatment/procedure(s) were performed by non-physician practitioner and as supervising physician I was immediately available for consultation/collaboration.   Suzi Roots, MD 04/20/11 [phone]

## 2011-04-20 NOTE — ED Notes (Signed)
Karen Sofia, PA-C at bedside 

## 2011-04-20 NOTE — ED Notes (Signed)
Pt transferred to ED hallway.  Awaiting [facility] for transportation.

## 2011-05-12 ENCOUNTER — Encounter: Payer: Self-pay | Admitting: Family Medicine

## 2011-05-12 DIAGNOSIS — G4700 Insomnia, unspecified: Secondary | ICD-10-CM | POA: Insufficient documentation

## 2011-05-12 DIAGNOSIS — I10 Essential (primary) hypertension: Secondary | ICD-10-CM | POA: Insufficient documentation

## 2011-05-12 DIAGNOSIS — I639 Cerebral infarction, unspecified: Secondary | ICD-10-CM | POA: Insufficient documentation

## 2011-06-11 ENCOUNTER — Encounter: Payer: Self-pay | Admitting: *Deleted

## 2011-06-22 ENCOUNTER — Emergency Department (INDEPENDENT_AMBULATORY_CARE_PROVIDER_SITE_OTHER): Payer: Medicare Other

## 2011-06-22 ENCOUNTER — Encounter (HOSPITAL_BASED_OUTPATIENT_CLINIC_OR_DEPARTMENT_OTHER): Payer: Self-pay | Admitting: Family Medicine

## 2011-06-22 ENCOUNTER — Other Ambulatory Visit (HOSPITAL_BASED_OUTPATIENT_CLINIC_OR_DEPARTMENT_OTHER): Payer: Medicare Other

## 2011-06-22 ENCOUNTER — Inpatient Hospital Stay (HOSPITAL_BASED_OUTPATIENT_CLINIC_OR_DEPARTMENT_OTHER)
Admission: EM | Admit: 2011-06-22 | Discharge: 2011-06-23 | DRG: 948 | Payer: Medicare Other | Attending: Family Medicine | Admitting: Family Medicine

## 2011-06-22 ENCOUNTER — Other Ambulatory Visit: Payer: Self-pay

## 2011-06-22 DIAGNOSIS — R404 Transient alteration of awareness: Secondary | ICD-10-CM

## 2011-06-22 DIAGNOSIS — I69928 Other speech and language deficits following unspecified cerebrovascular disease: Secondary | ICD-10-CM

## 2011-06-22 DIAGNOSIS — R5381 Other malaise: Secondary | ICD-10-CM | POA: Diagnosis present

## 2011-06-22 DIAGNOSIS — I69998 Other sequelae following unspecified cerebrovascular disease: Secondary | ICD-10-CM

## 2011-06-22 DIAGNOSIS — F319 Bipolar disorder, unspecified: Secondary | ICD-10-CM | POA: Diagnosis present

## 2011-06-22 DIAGNOSIS — E782 Mixed hyperlipidemia: Secondary | ICD-10-CM | POA: Diagnosis present

## 2011-06-22 DIAGNOSIS — E876 Hypokalemia: Secondary | ICD-10-CM | POA: Diagnosis not present

## 2011-06-22 DIAGNOSIS — E785 Hyperlipidemia, unspecified: Secondary | ICD-10-CM | POA: Diagnosis present

## 2011-06-22 DIAGNOSIS — Z794 Long term (current) use of insulin: Secondary | ICD-10-CM

## 2011-06-22 DIAGNOSIS — G319 Degenerative disease of nervous system, unspecified: Secondary | ICD-10-CM

## 2011-06-22 DIAGNOSIS — W19XXXA Unspecified fall, initial encounter: Secondary | ICD-10-CM

## 2011-06-22 DIAGNOSIS — I10 Essential (primary) hypertension: Secondary | ICD-10-CM | POA: Diagnosis present

## 2011-06-22 DIAGNOSIS — E119 Type 2 diabetes mellitus without complications: Secondary | ICD-10-CM | POA: Diagnosis present

## 2011-06-22 DIAGNOSIS — R55 Syncope and collapse: Secondary | ICD-10-CM

## 2011-06-22 DIAGNOSIS — R4182 Altered mental status, unspecified: Principal | ICD-10-CM

## 2011-06-22 DIAGNOSIS — I639 Cerebral infarction, unspecified: Secondary | ICD-10-CM | POA: Diagnosis present

## 2011-06-22 DIAGNOSIS — Z87891 Personal history of nicotine dependence: Secondary | ICD-10-CM

## 2011-06-22 DIAGNOSIS — K219 Gastro-esophageal reflux disease without esophagitis: Secondary | ICD-10-CM | POA: Diagnosis present

## 2011-06-22 DIAGNOSIS — R5383 Other fatigue: Secondary | ICD-10-CM | POA: Diagnosis present

## 2011-06-22 DIAGNOSIS — Z7982 Long term (current) use of aspirin: Secondary | ICD-10-CM

## 2011-06-22 DIAGNOSIS — I2510 Atherosclerotic heart disease of native coronary artery without angina pectoris: Secondary | ICD-10-CM | POA: Diagnosis present

## 2011-06-22 DIAGNOSIS — F988 Other specified behavioral and emotional disorders with onset usually occurring in childhood and adolescence: Secondary | ICD-10-CM | POA: Insufficient documentation

## 2011-06-22 DIAGNOSIS — Z9181 History of falling: Secondary | ICD-10-CM

## 2011-06-22 DIAGNOSIS — S0180XA Unspecified open wound of other part of head, initial encounter: Secondary | ICD-10-CM

## 2011-06-22 DIAGNOSIS — IMO0001 Reserved for inherently not codable concepts without codable children: Secondary | ICD-10-CM | POA: Diagnosis present

## 2011-06-22 DIAGNOSIS — Z79899 Other long term (current) drug therapy: Secondary | ICD-10-CM

## 2011-06-22 LAB — DIFFERENTIAL
Basophils Relative: 0 % (ref 0–1)
Eosinophils Absolute: 0.4 10*3/uL (ref 0.0–0.7)
Eosinophils Relative: 5 % (ref 0–5)
Monocytes Relative: 7 % (ref 3–12)
Neutrophils Relative %: 65 % (ref 43–77)

## 2011-06-22 LAB — COMPREHENSIVE METABOLIC PANEL
Albumin: 4 g/dL (ref 3.5–5.2)
Alkaline Phosphatase: 146 U/L — ABNORMAL HIGH (ref 39–117)
BUN: 27 mg/dL — ABNORMAL HIGH (ref 6–23)
Calcium: 9.5 mg/dL (ref 8.4–10.5)
Creatinine, Ser: 0.9 mg/dL (ref 0.50–1.35)
GFR calc Af Amer: >90 mL/min (ref >90)
Glucose, Bld: 204 mg/dL — ABNORMAL HIGH (ref 70–99)
Potassium: 4.5 mEq/L (ref 3.5–5.1)
Total Protein: 6.2 g/dL (ref 6.0–8.3)

## 2011-06-22 LAB — CBC
Hemoglobin: 13.7 g/dL (ref 13.0–17.0)
MCH: 29.8 pg (ref 26.0–34.0)
MCHC: 34.8 g/dL (ref 30.0–36.0)
MCV: 85.7 fL (ref 78.0–100.0)

## 2011-06-22 LAB — URINALYSIS, ROUTINE W REFLEX MICROSCOPIC
Glucose, UA: 500 mg/dL — AB
Ketones, ur: 15 mg/dL — AB
Protein, ur: NEGATIVE mg/dL
Specific Gravity, Urine: 1.036 — ABNORMAL HIGH (ref 1.005–1.030)
Urobilinogen, UA: 1 mg/dL (ref 0.0–1.0)
pH: 6.5 (ref 5.0–8.0)

## 2011-06-22 LAB — GLUCOSE, CAPILLARY: Glucose-Capillary: 196 mg/dL — ABNORMAL HIGH (ref 70–99)

## 2011-06-22 MED ORDER — LORAZEPAM 1 MG PO TABS
3.0000 mg | ORAL_TABLET | Freq: Every day | ORAL | Status: DC
  Administered 2011-06-23: 3 mg via ORAL
  Filled 2011-06-22: qty 3

## 2011-06-22 MED ORDER — SODIUM CHLORIDE 0.9 % IJ SOLN
3.0000 mL | Freq: Two times a day (BID) | INTRAMUSCULAR | Status: DC
  Administered 2011-06-23: 3 mL via INTRAVENOUS

## 2011-06-22 MED ORDER — LORAZEPAM 2 MG/ML IJ SOLN
1.0000 mg | Freq: Once | INTRAMUSCULAR | Status: AC
  Administered 2011-06-22: 1 mg via INTRAVENOUS
  Filled 2011-06-22: qty 1

## 2011-06-22 MED ORDER — ENOXAPARIN SODIUM 40 MG/0.4ML ~~LOC~~ SOLN
40.0000 mg | Freq: Every day | SUBCUTANEOUS | Status: DC
  Filled 2011-06-22: qty 0.4

## 2011-06-22 MED ORDER — PANTOPRAZOLE SODIUM 40 MG PO TBEC
40.0000 mg | DELAYED_RELEASE_TABLET | Freq: Every day | ORAL | Status: DC
  Administered 2011-06-23 (×2): 40 mg via ORAL
  Filled 2011-06-22 (×2): qty 1

## 2011-06-22 MED ORDER — LORAZEPAM 0.5 MG PO TABS: 0.5000 mg | ORAL_TABLET | Freq: Two times a day (BID) | ORAL | Status: DC

## 2011-06-22 MED ORDER — LORAZEPAM 0.5 MG PO TABS
0.5000 mg | ORAL_TABLET | Freq: Every day | ORAL | Status: DC
  Administered 2011-06-23: 0.5 mg via ORAL
  Filled 2011-06-22: qty 1

## 2011-06-22 MED ORDER — METOPROLOL SUCCINATE ER 25 MG PO TB24
25.0000 mg | ORAL_TABLET | Freq: Every day | ORAL | Status: DC
  Administered 2011-06-23: 25 mg via ORAL
  Filled 2011-06-22: qty 1

## 2011-06-22 MED ORDER — ACETAMINOPHEN 325 MG PO TABS: 650.0000 mg | ORAL_TABLET | Freq: Four times a day (QID) | ORAL | Status: DC | PRN

## 2011-06-22 MED ORDER — AMPHETAMINE-DEXTROAMPHET ER 10 MG PO CP24
20.0000 mg | ORAL_CAPSULE | ORAL | Status: DC
  Administered 2011-06-23: 20 mg via ORAL
  Filled 2011-06-22: qty 2

## 2011-06-22 MED ORDER — BUPROPION HCL ER (XL) 300 MG PO TB24
450.0000 mg | ORAL_TABLET | ORAL | Status: DC
  Administered 2011-06-23: 450 mg via ORAL
  Filled 2011-06-22 (×2): qty 1

## 2011-06-22 MED ORDER — SODIUM CHLORIDE 0.9 % IV SOLN: 250.0000 mL | INTRAVENOUS | Status: DC | PRN

## 2011-06-22 MED ORDER — ASPIRIN EC 81 MG PO TBEC
81.0000 mg | DELAYED_RELEASE_TABLET | Freq: Every day | ORAL | Status: DC
  Administered 2011-06-23: 81 mg via ORAL
  Filled 2011-06-22: qty 1

## 2011-06-22 MED ORDER — OXCARBAZEPINE 300 MG PO TABS
300.0000 mg | ORAL_TABLET | Freq: Two times a day (BID) | ORAL | Status: DC
  Administered 2011-06-23 (×2): 300 mg via ORAL
  Filled 2011-06-22 (×3): qty 1

## 2011-06-22 MED ORDER — INSULIN GLARGINE 100 UNIT/ML ~~LOC~~ SOLN
10.0000 [IU] | Freq: Every day | SUBCUTANEOUS | Status: DC
  Filled 2011-06-22: qty 3

## 2011-06-22 MED ORDER — HYDROXYZINE HCL 25 MG PO TABS
25.0000 mg | ORAL_TABLET | Freq: Every day | ORAL | Status: DC
  Filled 2011-06-22: qty 1

## 2011-06-22 MED ORDER — INSULIN ASPART 100 UNIT/ML ~~LOC~~ SOLN
0.0000 [IU] | Freq: Three times a day (TID) | SUBCUTANEOUS | Status: DC
  Administered 2011-06-23: 3 [IU] via SUBCUTANEOUS
  Administered 2011-06-23: 5 [IU] via SUBCUTANEOUS
  Filled 2011-06-22: qty 3

## 2011-06-22 MED ORDER — ACETAMINOPHEN 650 MG RE SUPP: 650.0000 mg | Freq: Four times a day (QID) | RECTAL | Status: DC | PRN

## 2011-06-22 MED ORDER — DULOXETINE HCL 60 MG PO CPEP
90.0000 mg | ORAL_CAPSULE | Freq: Every day | ORAL | Status: DC
  Administered 2011-06-23: 90 mg via ORAL
  Filled 2011-06-22: qty 1

## 2011-06-22 MED ORDER — ROSUVASTATIN CALCIUM 40 MG PO TABS
40.0000 mg | ORAL_TABLET | Freq: Every day | ORAL | Status: DC
  Filled 2011-06-22: qty 1

## 2011-06-22 MED ORDER — SODIUM CHLORIDE 0.9 % IJ SOLN: 3.0000 mL | INTRAMUSCULAR | Status: DC | PRN
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
## 2011-06-22 NOTE — ED Notes (Signed)
Per EMS, pt had syncopal episode after working out. Pt had witnessed LOC. Pt from [facility], independent living. 12 lead unremarkable, 20 L AC, CBG 221. Pt awake but slow to answer upon EMS arrival.

## 2011-06-22 NOTE — ED Notes (Signed)
Attempted to call report to [place], RN on unit [id number].
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
## 2011-06-23 ENCOUNTER — Ambulatory Visit: Payer: Self-pay | Admitting: Emergency Medicine

## 2011-06-23 ENCOUNTER — Telehealth: Payer: Self-pay

## 2011-06-23 DIAGNOSIS — R4182 Altered mental status, unspecified: Principal | ICD-10-CM

## 2011-06-23 LAB — LIPID PANEL
Cholesterol: 114 mg/dL (ref 0–200)
HDL: 44 mg/dL (ref >39)
Total CHOL/HDL Ratio: 2.6 RATIO
Triglycerides: 63 mg/dL (ref <150)

## 2011-06-23 LAB — RAPID URINE DRUG SCREEN, HOSP PERFORMED
Barbiturates: NOT DETECTED
Benzodiazepines: NOT DETECTED
Cocaine: NOT DETECTED
Opiates: NOT DETECTED

## 2011-06-23 LAB — CARDIAC PANEL(CRET KIN+CKTOT+MB+TROPI)
CK, MB: 2.7 ng/mL (ref 0.3–4.0)
Relative Index: 2.3 (ref 0.0–2.5)
Troponin I: <0.3 ng/mL (ref <0.30)

## 2011-06-23 LAB — CREATININE, SERUM
GFR calc Af Amer: >90 mL/min (ref >90)
GFR calc non Af Amer: >90 mL/min (ref >90)

## 2011-06-23 LAB — CBC
HCT: 37.4 % — ABNORMAL LOW (ref 39.0–52.0)
HCT: 39.2 % (ref 39.0–52.0)
Hemoglobin: 13.6 g/dL (ref 13.0–17.0)
RDW: 13 % (ref 11.5–15.5)
RDW: 13 % (ref 11.5–15.5)
WBC: 8.3 10*3/uL (ref 4.0–10.5)
WBC: 9.8 10*3/uL (ref 4.0–10.5)

## 2011-06-23 LAB — BASIC METABOLIC PANEL
Chloride: 106 mEq/L (ref 96–112)
Creatinine, Ser: 0.93 mg/dL (ref 0.50–1.35)
GFR calc Af Amer: >90 mL/min (ref >90)
GFR calc non Af Amer: 88 mL/min — ABNORMAL LOW (ref >90)
Potassium: 3.3 mEq/L — ABNORMAL LOW (ref 3.5–5.1)

## 2011-06-23 MED ORDER — INSULIN GLARGINE 100 UNIT/ML ~~LOC~~ SOLN: 20.0000 [IU] | Freq: Four times a day (QID) | SUBCUTANEOUS | Status: DC

## 2011-06-23 MED ORDER — INSULIN GLARGINE 100 UNIT/ML ~~LOC~~ SOLN
10.0000 [IU] | Freq: Once | SUBCUTANEOUS | Status: AC
  Administered 2011-06-23: 10 [IU] via SUBCUTANEOUS

## 2011-06-23 MED ORDER — POTASSIUM CHLORIDE CRYS ER 20 MEQ PO TBCR
30.0000 meq | EXTENDED_RELEASE_TABLET | Freq: Once | ORAL | Status: AC
  Administered 2011-06-23: 30 meq via ORAL
  Filled 2011-06-23: qty 1

## 2011-06-23 MED ORDER — INSULIN GLARGINE 100 UNIT/ML ~~LOC~~ SOLN
10.0000 [IU] | Freq: Every day | SUBCUTANEOUS | Status: DC
  Administered 2011-06-23: 10 [IU] via SUBCUTANEOUS

## 2011-06-23 MED ORDER — NICOTINE 21 MG/24HR TD PT24
21.0000 mg | MEDICATED_PATCH | Freq: Every day | TRANSDERMAL | Status: DC
  Filled 2011-06-23: qty 1

## 2011-06-23 NOTE — Discharge Summary (Signed)
Physician Discharge Summary  Patient ID: Kyle [name] MRN: [id number] DOB: 1950-06-27 Age: 61 y.o.  Admit date: 06/22/2011 Discharge date: 06/23/2011  PCP: No primary provider on file.  Primary Neurologist: Paulino Rily, MD of [place]   Discharge Diagnosis: Principal Problem:  *Altered mental status Active Problems:  Bipolar disorder  Type II or unspecified type diabetes mellitus without mention of complication, uncontrolled  Mixed hyperlipidemia  ADD (attention deficit disorder)  CAD (coronary artery disease)  Stroke  Hypertension  CVA (cerebral vascular accident)    Hospital Course 61 yo M with h/o stroke p/w altered mental status s/p non-traumatic fall. Patient with history of falls and family and neurology confirmed history of normal pressure hydrocephalus. Falls multifactorial as on multiple psych meds, DM neuropathy, decreased strength and gait issues from prior stroke.   1.Altered Mental Status: No signs of UTI, cardiac ischemia, hypoglycemia, seizure-like activity, or other clear etiology for altered MS; no new drugs per patient, no HA, neck pain, heavy lifting for concern of carotid dissection; concern for new CVA/stroke/TIA but completely resolved and TIAs typically do not cause loss of consciousness. Also possible hypovolemia related to dehydration as patient with BUN/Cr >20 and spec grav 1.036 at time of admission or vasovagal after exercise.  -at admission, patient had improved, patient appears back to baseline with some mild confusion concerning time/date without CN or other focal deficits  -Head CT negative for acute bleed; UDS only positive for amphetamines and patient on adderall.  -risk stratification for vascular disease-tsh wnl, a1c elevated at 8.2, LDL 57 at goal  -CE neg x2 -orthostatics negative at time of discharge -Pt with h/o bipolar d/o, unclear if he has recently changed medications (since 04/2011)- per pt they are stable  -contacted neurologist  office Dr. Paulino Rily In [facility]. MD not available, Nursing reviewed records and saw hydrocephalus as well as memory disturbance in his history. Has baseline level difficulty with short term memory and with orientation with time per nursing  -concerning possible dehydration-will encourage PO intake.  -obtained Oxcarbazepine level but this will not be available for several days as it is a send out.  2. Hypertension: SBps into 140s, 160s, but with restart of metoprolol was controlled this morning at 129/81.  3. DM (type 2): CBG 196-230 in house On Lantus 10units and mod SSI. Patient reports 50 units in AM and 10 units pm. [place] says 30 AM, 20 PM. Will restart regimen reported by [place] at discharge.  -Held metformin overnight, in case there is need for procedures with contrast, restart on d/c  4. CAD/HLD: -Continued home statin, ASA  5. Bipolar D/o-extent of psychiatric medicines could contribute to AMS. Would like to trim list but will defer to PCP/psychiatry  -Home meds: Adderall, Wellbutrin, Cymbalta, Hydroxyzine, Ativan, Restoril, Trileptal (as of 04/28/11, confirmed by [place])  -Followed by Dr. Donell Beers  -Given that patient's mental status is back to baseline/normal, we will continue his home psych medications  But would highly consider decreasing regimen as able.  6. GERD: - PPI in house as pt on Dexilant at home  7. Hypokalemia-repleted   Procedures/Imaging:  Dg Chest 2 View  06/22/2011  *RADIOLOGY REPORT*  Clinical Data: Syncopal episode.  Loss of consciousness. Abrasions/laceration over left eye.  CHEST - 2 VIEW  Comparison: 08/17/2010.  Findings: Old left-sided rib fractures appear healed.  No acute displaced rib fracture.  No pneumothorax.  Cardiopericardial silhouette within normal limits. Mediastinal contours normal. Trachea midline.  No airspace disease or effusion.  Hypertrophy of the distal right clavicle may be post-traumatic or osteoarthritic.  IMPRESSION: No acute  cardiopulmonary disease.  Original Report Authenticated By: Andreas Newport, M.D.   Ct Head Wo Contrast  06/22/2011  *RADIOLOGY REPORT*  Clinical Data: Syncopal episode.  CT HEAD WITHOUT CONTRAST  Technique:  Contiguous axial images were obtained from the base of the skull through the vertex without contrast.  Comparison: 03/14/2010.  Findings: No mass lesion, mass effect, midline shift, hydrocephalus, hemorrhage.  No acute territorial cortical ischemia/infarct. Atrophy and chronic ischemic white matter disease is present.  Small left sub insular lacunar infarct appears unchanged compared to the prior MRI.  IMPRESSION: Atrophy and chronic ischemic white matter disease without acute intracranial abnormality.  Original Report Authenticated By: Andreas Newport, M.D.    Labs  CBC  Lab 06/23/11 0610 06/23/11 0027 06/22/11 1705  WBC 8.3 9.8 8.7  HGB 12.6* 13.6 13.7  HCT 37.4* 39.2 39.4  PLT 174 206 209   BMET  Lab 06/23/11 0610 06/23/11 0027 06/22/11 1705  NA 142 -- 139  K 3.3* -- 4.5  CL 106 -- 103  CO2 27 -- 26  BUN 24* -- 27*  CREATININE 0.93 0.88 0.90  CALCIUM 9.2 -- 9.5  PROT -- -- 6.2  BILITOT -- -- 0.3  ALKPHOS -- -- 146*  ALT -- -- 30  AST -- -- 22  GLUCOSE 231* -- 204*   Results for orders placed during the hospital encounter of 06/22/11 (from the past 72 hour(s))  CBC     Status: Normal   Collection Time   06/22/11  5:05 PM      Component Value Range Comment   WBC 8.7  4.0 - 10.5 (K/uL)    RBC 4.60  4.22 - 5.81 (MIL/uL)    Hemoglobin 13.7  13.0 - 17.0 (g/dL)    HCT 50.0  93.8 - 18.2 (%)    MCV 85.7  78.0 - 100.0 (fL)    MCH 29.8  26.0 - 34.0 (pg)    MCHC 34.8  30.0 - 36.0 (g/dL)    RDW 99.3  71.6 - 96.7 (%)    Platelets 209  150 - 400 (K/uL)   DIFFERENTIAL     Status: Normal   Collection Time   06/22/11  5:05 PM      Component Value Range Comment   Neutrophils Relative 65  43 - 77 (%)    Neutro Abs 5.6  1.7 - 7.7 (K/uL)    Lymphocytes Relative 23  12 - 46 (%)     Lymphs Abs 2.0  0.7 - 4.0 (K/uL)    Monocytes Relative 7  3 - 12 (%)    Monocytes Absolute 0.6  0.1 - 1.0 (K/uL)    Eosinophils Relative 5  0 - 5 (%)    Eosinophils Absolute 0.4  0.0 - 0.7 (K/uL)    Basophils Relative 0  0 - 1 (%)    Basophils Absolute 0.0  0.0 - 0.1 (K/uL)   COMPREHENSIVE METABOLIC PANEL     Status: Abnormal   Collection Time   06/22/11  5:05 PM      Component Value Range Comment   Sodium 139  135 - 145 (mEq/L)    Potassium 4.5  3.5 - 5.1 (mEq/L)    Chloride 103  96 - 112 (mEq/L)    CO2 26  19 - 32 (mEq/L)    Glucose, Bld 204 (*) 70 - 99 (mg/dL)    BUN 27 (*) 6 - 23 (  mg/dL)    Creatinine, Ser 4.09  0.50 - 1.35 (mg/dL)    Calcium 9.5  8.4 - 10.5 (mg/dL)    Total Protein 6.2  6.0 - 8.3 (g/dL)    Albumin 4.0  3.5 - 5.2 (g/dL)    AST 22  0 - 37 (U/L)    ALT 30  0 - 53 (U/L)    Alkaline Phosphatase 146 (*) 39 - 117 (U/L)    Total Bilirubin 0.3  0.3 - 1.2 (mg/dL)    GFR calc non Af Amer 89 (*) >90 (mL/min)    GFR calc Af Amer >90  >90 (mL/min)   CARDIAC PANEL(CRET KIN+CKTOT+MB+TROPI)     Status: Normal   Collection Time   06/22/11  5:05 PM      Component Value Range Comment   Total CK 123  7 - 232 (U/L)    CK, MB 3.1  0.3 - 4.0 (ng/mL)    Troponin I <0.30  <0.30 (ng/mL)    Relative Index 2.5  0.0 - 2.5    URINALYSIS, ROUTINE W REFLEX MICROSCOPIC     Status: Abnormal   Collection Time   06/22/11  5:06 PM      Component Value Range Comment   Color, Urine YELLOW  YELLOW     APPearance CLEAR  CLEAR     Specific Gravity, Urine 1.036 (*) 1.005 - 1.030     pH 6.5  5.0 - 8.0     Glucose, UA 500 (*) NEGATIVE (mg/dL)    Hgb urine dipstick NEGATIVE  NEGATIVE     Bilirubin Urine NEGATIVE  NEGATIVE     Ketones, ur 15 (*) NEGATIVE (mg/dL)    Protein, ur NEGATIVE  NEGATIVE (mg/dL)    Urobilinogen, UA 1.0  0.0 - 1.0 (mg/dL)    Nitrite NEGATIVE  NEGATIVE     Leukocytes, UA NEGATIVE  NEGATIVE  MICROSCOPIC NOT DONE ON URINES WITH NEGATIVE PROTEIN, BLOOD, LEUKOCYTES, NITRITE,  OR GLUCOSE <1000 mg/dL.  GLUCOSE, CAPILLARY     Status: Abnormal   Collection Time   06/22/11 11:42 PM      Component Value Range Comment   Glucose-Capillary 196 (*) 70 - 99 (mg/dL)   URINE RAPID DRUG SCREEN (HOSP PERFORMED)     Status: Abnormal   Collection Time   06/23/11 12:23 AM      Component Value Range Comment   Opiates NONE DETECTED  NONE DETECTED     Cocaine NONE DETECTED  NONE DETECTED     Benzodiazepines NONE DETECTED  NONE DETECTED     Amphetamines POSITIVE (*) NONE DETECTED     Tetrahydrocannabinol NONE DETECTED  NONE DETECTED     Barbiturates NONE DETECTED  NONE DETECTED    CBC     Status: Normal   Collection Time   06/23/11 12:27 AM      Component Value Range Comment   WBC 9.8  4.0 - 10.5 (K/uL)    RBC 4.57  4.22 - 5.81 (MIL/uL)    Hemoglobin 13.6  13.0 - 17.0 (g/dL)    HCT 81.1  91.4 - 78.2 (%)    MCV 85.8  78.0 - 100.0 (fL)    MCH 29.8  26.0 - 34.0 (pg)    MCHC 34.7  30.0 - 36.0 (g/dL)    RDW 95.6  21.3 - 08.6 (%)    Platelets 206  150 - 400 (K/uL)   CREATININE, SERUM     Status: Normal   Collection Time   06/23/11 12:27 AM  Component Value Range Comment   Creatinine, Ser 0.88  0.50 - 1.35 (mg/dL)    GFR calc non Af Amer >90  >90 (mL/min)    GFR calc Af Amer >90  >90 (mL/min)   HEMOGLOBIN A1C     Status: Abnormal   Collection Time   06/23/11 12:27 AM      Component Value Range Comment   Hemoglobin A1C 8.2 (*) <5.7 (%)    Mean Plasma Glucose 189 (*) <117 (mg/dL)   BASIC METABOLIC PANEL     Status: Abnormal   Collection Time   06/23/11  6:10 AM      Component Value Range Comment   Sodium 142  135 - 145 (mEq/L)    Potassium 3.3 (*) 3.5 - 5.1 (mEq/L)    Chloride 106  96 - 112 (mEq/L)    CO2 27  19 - 32 (mEq/L)    Glucose, Bld 231 (*) 70 - 99 (mg/dL)    BUN 24 (*) 6 - 23 (mg/dL)    Creatinine, Ser 4.78  0.50 - 1.35 (mg/dL)    Calcium 9.2  8.4 - 10.5 (mg/dL)    GFR calc non Af Amer 88 (*) >90 (mL/min)    GFR calc Af Amer >90  >90 (mL/min)   CBC      Status: Abnormal   Collection Time   06/23/11  6:10 AM      Component Value Range Comment   WBC 8.3  4.0 - 10.5 (K/uL)    RBC 4.36  4.22 - 5.81 (MIL/uL)    Hemoglobin 12.6 (*) 13.0 - 17.0 (g/dL)    HCT 29.5 (*) 62.1 - 52.0 (%)    MCV 85.8  78.0 - 100.0 (fL)    MCH 28.9  26.0 - 34.0 (pg)    MCHC 33.7  30.0 - 36.0 (g/dL)    RDW 30.8  65.7 - 84.6 (%)    Platelets 174  150 - 400 (K/uL)   TSH     Status: Normal   Collection Time   06/23/11  6:10 AM      Component Value Range Comment   TSH 1.375  0.350 - 4.500 (uIU/mL)   LIPID PANEL     Status: Normal   Collection Time   06/23/11  6:10 AM      Component Value Range Comment   Cholesterol 114  0 - 200 (mg/dL)    Triglycerides 63  <962 (mg/dL)    HDL 44  >95 (mg/dL)    Total CHOL/HDL Ratio 2.6      VLDL 13  0 - 40 (mg/dL)    LDL Cholesterol 57  0 - 99 (mg/dL)   GLUCOSE, CAPILLARY     Status: Abnormal   Collection Time   06/23/11  8:06 AM      Component Value Range Comment   Glucose-Capillary 199 (*) 70 - 99 (mg/dL)    Comment 1 Notify RN     CARDIAC PANEL(CRET KIN+CKTOT+MB+TROPI)     Status: Normal   Collection Time   06/23/11 11:10 AM      Component Value Range Comment   Total CK 115  7 - 232 (U/L)    CK, MB 2.7  0.3 - 4.0 (ng/mL)    Troponin I <0.30  <0.30 (ng/mL)    Relative Index 2.3  0.0 - 2.5    GLUCOSE, CAPILLARY     Status: Abnormal   Collection Time   06/23/11 12:34 PM      Component Value Range  Comment   Glucose-Capillary 216 (*) 70 - 99 (mg/dL)    Comment 1 Notify RN          Patient condition at time of discharge/disposition: stable  Disposition- independent living facility   Follow up issues: 1. Please follow up to see if any more confusion or falls. Patient with history of falls and reports currently in PT for gait and strength.  2. Patient would benefit from following up with neurologist for possible normal pressure hydrocephalus.  3. Please consider deescalating number of psych meds and meds that could  contribute to falls.  4. Patient may benefit from different HTN medication given beta blockers can contribute to vasovagal episodes.   Discharge follow up:  Follow-up Information    Follow up with DAUB,STEVE A, MD. Schedule an appointment as soon as possible for a visit in 1 week.   Contact information:   [address] [place] [address] [phone]       Follow up with MILLER,J KEITH. Schedule an appointment as soon as possible for a visit in 1 week. (for neurology follow up)    Contact information:   [address] [place] [address] [phone]         Discharge Orders    Future Appointments: Provider: Department: Dept Phone: Center:   06/23/2011 2:30 PM Lucilla Edin, MD [facility] [phone] [facility]     Future Orders Please Complete By Expires   Diet Carb Modified      Increase activity slowly      Call MD for:  persistant dizziness or light-headedness      Call MD for:  extreme fatigue      Call MD for:      Scheduling Instructions:   Any falls, confusion      Discharge Medications  Kyle [name], Kyle [name]  Home Medication Instructions [id number]   Printed on:06/23/11 1410  Medication Information                    amphetamine-dextroamphetamine (ADDERALL XR) 10 MG 24 hr capsule Take 20 mg by mouth every morning.             LORazepam (ATIVAN) 1 MG tablet Take 0.5-3 mg by mouth 2 (two) times daily. Take 0.5 tablet (0.5 MG) in the morning, and 3 tablets (3 MG) at bedtime.           buPROPion (WELLBUTRIN XL) 150 MG 24 hr tablet Take 450 mg by mouth every morning.             hydrOXYzine (ATARAX/VISTARIL) 25 MG tablet Take 25 mg by mouth at bedtime.             metFORMIN (GLUCOPHAGE) 500 MG tablet Take 500 mg by mouth 2 (two) times daily with a meal.             atorvastatin (LIPITOR) 80 MG tablet Take 80 mg by mouth daily.             Oxcarbazepine (TRILEPTAL) 300 MG tablet Take 300 mg by mouth 2 (two) times daily.               dexlansoprazole (DEXILANT) 60 MG capsule Take 60 mg by mouth daily.             Calcium Carbonate-Vitamin D (CALCIUM + D PO) Take 1 tablet by mouth at bedtime.            aspirin EC 81  MG tablet Take 81 mg by mouth daily.             ferrous sulfate 325 (65 FE) MG tablet Take 325 mg by mouth daily with breakfast.             DULoxetine (CYMBALTA) 30 MG capsule Take 90 mg by mouth daily.           Multiple Vitamin (MULITIVITAMIN WITH MINERALS) TABS Take 1 tablet by mouth daily.           metoprolol succinate (TOPROL-XL) 25 MG 24 hr tablet Take 25 mg by mouth daily.           insulin glargine (LANTUS) 100 UNIT/ML injection Inject 20-30 Units into the skin 4 (four) times daily. 30 units QAM, 20 units at nighttime                Tana Conch, MD of [facility] 06/23/2011 2:10 PM

## 2011-06-23 NOTE — Telephone Encounter (Signed)
.  [facility]  PATTY RUTH AT [place] NEEDS TO KNOW THE DOSAGE OF LANTIS - CURRENTLY INPATIENT AT [place]  CALL [phone]

## 2011-06-23 NOTE — Progress Notes (Signed)
Clinical Child psychotherapist (CSW) confirmed that patient is from [facility] [employer] [facility]. CSW confirmed with facility and pt. Pt stated he may need transportation to the facility at dc. CSW will provide transportation if need me.  Theresia Bough, MSW, Theresia Majors [phone]

## 2011-06-23 NOTE — Progress Notes (Signed)
I discussed with Dr Hunter.  I agree with their plans documented in their  Note for today.  

## 2011-06-23 NOTE — Progress Notes (Signed)
PGY-1 Daily Progress Note Family Medicine Teaching Service Kyle [name]. Marti Sleigh, MD Service Pager: [phone]   Subjective: Patient really wants to go home. Says doesn't understand why he is still here. He says he is not confused at all bu admits he does not remember period of time where he was told he was confused after a fall yesterday. Also doesn't remember fall.   Objective:  Temp:  [98.1 F (36.7 C)-99.5 F (37.5 C)] 98.9 F (37.2 C) (01/29 0502) Pulse Rate:  [58-73] 58  (01/29 0502) Resp:  [14-20] 18  (01/29 0502) BP: (129-168)/(81-112) 129/81 mmHg (01/29 0502) SpO2:  [97 %-100 %] 100 % (01/29 0502) Weight:  [180 lb (81.647 kg)] 180 lb (81.647 kg) (01/28 1612) No intake or output data in the 24 hours ending 06/23/11 0935  Gen:  NAD HEENT: mildly dry mucus membraes CV: Regular rate and rhythm, no murmurs rubs or gallops PULM: clear to auscultation bilaterally. No wheezes/rales/rhonchi ABD: soft/nontender/nondistended/normal bowel sounds EXT: No edema Neuro: Alert and oriented to person and place. Thinks year is in 37s. Unclear of patient baseline. Also mentions some odd things such as taking 500 units of insulin.   Labs and imaging:   CBC  Lab 06/23/11 0610 06/23/11 0027 06/22/11 1705  WBC 8.3 9.8 8.7  HGB 12.6* 13.6 13.7  HCT 37.4* 39.2 39.4  PLT 174 206 209   BMET  Lab 06/23/11 0610 06/23/11 0027 06/22/11 1705  NA 142 -- 139  K 3.3* -- 4.5  CL 106 -- 103  CO2 27 -- 26  BUN 24* -- 27*  CREATININE 0.93 0.88 0.90  LABGLOM -- -- --  GLUCOSE 231* -- --  CALCIUM 9.2 -- 9.5    Dg Chest 2 View  06/22/2011  *RADIOLOGY REPORT*  Clinical Data: Syncopal episode.  Loss of consciousness. Abrasions/laceration over left eye.  CHEST - 2 VIEW  Comparison: 08/17/2010.  Findings: Old left-sided rib fractures appear healed.  No acute displaced rib fracture.  No pneumothorax.  Cardiopericardial silhouette within normal limits. Mediastinal contours normal. Trachea midline.  No  airspace disease or effusion.  Hypertrophy of the distal right clavicle may be post-traumatic or osteoarthritic.  IMPRESSION: No acute cardiopulmonary disease.  Original Report Authenticated By: Andreas Newport, M.D.   Ct Head Wo Contrast  06/22/2011  *RADIOLOGY REPORT*  Clinical Data: Syncopal episode.  CT HEAD WITHOUT CONTRAST  Technique:  Contiguous axial images were obtained from the base of the skull through the vertex without contrast.  Comparison: 03/14/2010.  Findings: No mass lesion, mass effect, midline shift, hydrocephalus, hemorrhage.  No acute territorial cortical ischemia/infarct. Atrophy and chronic ischemic white matter disease is present.  Small left sub insular lacunar infarct appears unchanged compared to the prior MRI.  IMPRESSION: Atrophy and chronic ischemic white matter disease without acute intracranial abnormality.  Original Report Authenticated By: Andreas Newport, M.D.   Assessment  61 yo M with h/o stroke p/w AMS s/p non-traumatic fall   1.Altered Mental Status: No signs of UTI, cardiac ischemia, hypoglycemia, seizure-like activity, or other clear etiology for altered MS; no new drugs per patient, no HA, neck pain, heavy lifting for concern of carotid dissection; concern for new CVA/stroke/TIA. Also possible hypovolemia related to dehydration as patient with BUN/Cr >20 and spec grav 1.036 at time of admission or vasovagal after exercise.   -at admit, Had improved, patient appears back to baseline, not confused, A&Ox4 without CN or other focal deficits   -Head CT negative for acute bleed; UDS only positive  for amphetamines and patient on adderall.   -risk stratification for vascular disease-tsh pend, a1c, LDL 57 at goal   -CE neg x1. Will repeat now.   -obtain orthostatics  -Pt with h/o bipolar d/o, unclear if he has recently changed medications (since 04/2011)- per pt they are stable  -contacted neurologist office Dr. Paulino Rily In [facility]. MD not available, Nursing  reviewed records and  saw hydrocephalus as well as memory disturbance in his history. Has baseline level difficulty with short term   memory and with orientation with time per nursing  -concerning possible dehydration-will encourage PO intake.   -obtained Oxcarbazepine level but this will not be available for several days as it is a send out.   2. Hypertension: SBps into 140s, 160s, but with restart of metoprolol was controlled this morning at 129/81.  -Restarted home metoprolol   3. DM (type 2): CBG 196-230. On Lantus 10units and mod SSI. Patient reports 50 units in AM and 10 units pm. [place] says 30 AM, 20 PM. Will give extra 10 units Lantus this AM.    -Hold metformin overnight, in case there is need for procedures with contrast, restart on d/c   4. CAD/HLD:  -Continue home statin, ASA   5. Bipolar D/o-extent of psychiatric medicines could contribute to AMS. Would like to trim list but will defer to PCP/psychiatry  -Home meds: Adderall, Wellbutrin, Cymbalta, Hydroxyzine, Ativan, Restoril, Trileptal (as of 04/28/11, confirmed by [place])   -Followed by Dr. Donell Beers   -Given that patient's mental status is back to baseline/normal, we will continue his home psych medications   6. GERD:  -Will start PPI in house as pt on Dexilant at home  FEN/GI: heart healthy diet, SLIV. repleted potassium.  Ppx: PPI, SQ heparin  Dispo: likely discharge today.   patient's former wife is available after 3pm if he is medically stable for d/c in the morning )home # [phone]; cell # [phone])    Tana Conch, MD PGY1, Family Medicine Teaching Service [phone]
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
## 2011-06-23 NOTE — Telephone Encounter (Signed)
Called [facility] and clarified Lantus dose.  Patient uses Solostar pen, and takes 30 units in am, and 20 units in pm.  They understood.

## 2011-06-24 NOTE — Discharge Summary (Signed)
I discussed with Dr Hunter.  I agree with their plans documented in their  Note for today.  

## 2011-07-01 ENCOUNTER — Other Ambulatory Visit: Payer: Self-pay | Admitting: Family Medicine

## 2011-07-01 MED ORDER — INSULIN GLARGINE 100 UNIT/ML ~~LOC~~ SOLN: 20.0000 [IU] | Freq: Four times a day (QID) | SUBCUTANEOUS | Status: DC

## 2011-07-03 ENCOUNTER — Ambulatory Visit: Payer: Medicare Other

## 2011-07-03 ENCOUNTER — Ambulatory Visit (INDEPENDENT_AMBULATORY_CARE_PROVIDER_SITE_OTHER): Payer: Medicare Other | Admitting: Emergency Medicine

## 2011-07-03 DIAGNOSIS — R269 Unspecified abnormalities of gait and mobility: Secondary | ICD-10-CM

## 2011-07-03 DIAGNOSIS — M25559 Pain in unspecified hip: Secondary | ICD-10-CM

## 2011-07-03 DIAGNOSIS — E782 Mixed hyperlipidemia: Secondary | ICD-10-CM

## 2011-07-03 DIAGNOSIS — E119 Type 2 diabetes mellitus without complications: Secondary | ICD-10-CM

## 2011-07-03 LAB — POCT GLYCOSYLATED HEMOGLOBIN (HGB A1C): Hemoglobin A1C: 7.9

## 2011-07-03 LAB — GLUCOSE, POCT (MANUAL RESULT ENTRY): POC Glucose: 346

## 2011-07-03 NOTE — Patient Instructions (Signed)
We'll contact [facility] to see if they can reinstitute his physical therapy. Hopefully they will be a little work with him and improve his balance and his lower extremities to decrease his chance of falling he has an appointment to be seen by the neurologist then reconsider placing of a shunt for normal pressure hydrocephalus.

## 2011-07-03 NOTE — Progress Notes (Signed)
  Subjective:    Patient ID: Kyle [name], male    DOB: 06-11-1950, 61 y.o.   MRN: [id number]  HPI patient enters because he is experiencing frequent episodes of falling. This does not appear to be associated with falls and his blood sugar. It appears that he has a lot of instability in his lower extremities and weakness as residual from his stroke. He is currently seeing a physical trainer and that seems to help he had a significant fall recently and presented to the emergency room for evaluation CT scan was obtained and did not reveal any evidence of a subdural he does have atrophy and what appears to be normal pressure hydrocephalus by CT scan to    Review of Systems patient states he still miserable at the care facility. He wants to get out of the care facility as soon as he can.     Objective:   Physical Exam patient looks alert and bright today. His chest is clear and his heart is regular rate without murmurs. There is a cantaloupe size hematoma over his right hip he has a significant limp when he walks. He is unstable with walking and needs the assistance of a walker   [facility] reading (PRIMARY) by  Dr.Drishti Pepperman.NAD no fracture      Assessment & Plan:  Patient is having significant problems with instability and frequent falls at care facility. Kidneys in the care of a personal trainer. They're requesting resuming his physical therapy to see if we can strengthen his lower extremities.

## 2011-07-07 ENCOUNTER — Emergency Department (HOSPITAL_COMMUNITY)
Admission: EM | Admit: 2011-07-07 | Discharge: 2011-07-07 | Disposition: A | Discharge status: Home / Self Care | Payer: Medicare Other | Attending: Emergency Medicine | Admitting: Emergency Medicine

## 2011-07-07 ENCOUNTER — Emergency Department (HOSPITAL_COMMUNITY): Payer: Medicare Other

## 2011-07-07 ENCOUNTER — Encounter (HOSPITAL_COMMUNITY): Payer: Self-pay | Admitting: Radiology

## 2011-07-07 DIAGNOSIS — F319 Bipolar disorder, unspecified: Secondary | ICD-10-CM | POA: Insufficient documentation

## 2011-07-07 DIAGNOSIS — IMO0002 Reserved for concepts with insufficient information to code with codable children: Secondary | ICD-10-CM | POA: Insufficient documentation

## 2011-07-07 DIAGNOSIS — Z8673 Personal history of transient ischemic attack (TIA), and cerebral infarction without residual deficits: Secondary | ICD-10-CM | POA: Insufficient documentation

## 2011-07-07 DIAGNOSIS — R61 Generalized hyperhidrosis: Secondary | ICD-10-CM | POA: Insufficient documentation

## 2011-07-07 DIAGNOSIS — F988 Other specified behavioral and emotional disorders with onset usually occurring in childhood and adolescence: Secondary | ICD-10-CM | POA: Insufficient documentation

## 2011-07-07 DIAGNOSIS — Z79899 Other long term (current) drug therapy: Secondary | ICD-10-CM | POA: Insufficient documentation

## 2011-07-07 DIAGNOSIS — R4182 Altered mental status, unspecified: Secondary | ICD-10-CM | POA: Insufficient documentation

## 2011-07-07 DIAGNOSIS — Z794 Long term (current) use of insulin: Secondary | ICD-10-CM | POA: Insufficient documentation

## 2011-07-07 DIAGNOSIS — S7010XA Contusion of unspecified thigh, initial encounter: Secondary | ICD-10-CM | POA: Insufficient documentation

## 2011-07-07 DIAGNOSIS — I2510 Atherosclerotic heart disease of native coronary artery without angina pectoris: Secondary | ICD-10-CM | POA: Insufficient documentation

## 2011-07-07 DIAGNOSIS — E785 Hyperlipidemia, unspecified: Secondary | ICD-10-CM | POA: Insufficient documentation

## 2011-07-07 DIAGNOSIS — R269 Unspecified abnormalities of gait and mobility: Secondary | ICD-10-CM | POA: Insufficient documentation

## 2011-07-07 DIAGNOSIS — S20229A Contusion of unspecified back wall of thorax, initial encounter: Secondary | ICD-10-CM | POA: Insufficient documentation

## 2011-07-07 DIAGNOSIS — E119 Type 2 diabetes mellitus without complications: Secondary | ICD-10-CM | POA: Insufficient documentation

## 2011-07-07 DIAGNOSIS — S40029A Contusion of unspecified upper arm, initial encounter: Secondary | ICD-10-CM | POA: Insufficient documentation

## 2011-07-07 DIAGNOSIS — W19XXXA Unspecified fall, initial encounter: Secondary | ICD-10-CM | POA: Insufficient documentation

## 2011-07-07 DIAGNOSIS — R42 Dizziness and giddiness: Secondary | ICD-10-CM | POA: Insufficient documentation

## 2011-07-07 LAB — RAPID URINE DRUG SCREEN, HOSP PERFORMED
Benzodiazepines: NOT DETECTED
Cocaine: NOT DETECTED
Opiates: NOT DETECTED
Tetrahydrocannabinol: NOT DETECTED

## 2011-07-07 LAB — POCT I-STAT TROPONIN I

## 2011-07-07 LAB — DIFFERENTIAL
Basophils Relative: 0 % (ref 0–1)
Lymphocytes Relative: 22 % (ref 12–46)
Lymphs Abs: 2.3 10*3/uL (ref 0.7–4.0)
Monocytes Absolute: 1 10*3/uL (ref 0.1–1.0)
Monocytes Relative: 9 % (ref 3–12)
Neutro Abs: 6.7 10*3/uL (ref 1.7–7.7)
Neutrophils Relative %: 65 % (ref 43–77)

## 2011-07-07 LAB — BASIC METABOLIC PANEL
BUN: 26 mg/dL — ABNORMAL HIGH (ref 6–23)
CO2: 25 mEq/L (ref 19–32)
Chloride: 103 mEq/L (ref 96–112)
GFR calc Af Amer: >90 mL/min (ref >90)
Glucose, Bld: 189 mg/dL — ABNORMAL HIGH (ref 70–99)
Potassium: 3.9 mEq/L (ref 3.5–5.1)

## 2011-07-07 LAB — URINALYSIS, ROUTINE W REFLEX MICROSCOPIC
Glucose, UA: 100 mg/dL — AB
Nitrite: NEGATIVE
Specific Gravity, Urine: 1.041 — ABNORMAL HIGH (ref 1.005–1.030)
pH: 5.5 (ref 5.0–8.0)

## 2011-07-07 LAB — CBC
HCT: 38 % — ABNORMAL LOW (ref 39.0–52.0)
Hemoglobin: 13 g/dL (ref 13.0–17.0)
RBC: 4.43 MIL/uL (ref 4.22–5.81)

## 2011-07-07 LAB — GLUCOSE, CAPILLARY
Glucose-Capillary: 185 mg/dL — ABNORMAL HIGH (ref 70–99)
Glucose-Capillary: 281 mg/dL — ABNORMAL HIGH (ref 70–99)

## 2011-07-07 MED ORDER — SODIUM CHLORIDE 0.9 % IV BOLUS (SEPSIS)
1000.0000 mL | Freq: Once | INTRAVENOUS | Status: AC
  Administered 2011-07-07: 1000 mL via INTRAVENOUS
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
## 2011-07-07 NOTE — ED Notes (Signed)
Patient transported to X-ray 

## 2011-07-07 NOTE — ED Provider Notes (Signed)
Medical screening examination/treatment/procedure(s) were conducted as a shared visit with non-physician practitioner(s) and myself.  I personally evaluated the patient during the encounter  Patient with increasing difficulty with walking and increased falls despite walker use at his assisted living facility.  Patient notably has multiple bruises on his head arms back and most notably on his right hip with significant hematoma and bruising.  Patient still needs to be ambulated to further assess if he has ataxia.  His episodes of falls do not appear to be syncope as patient denies that he loses consciousness.  At this point time I do question the patient's safety at assisted living since I cannot monitor him as much as he may need given the amount of falls this patient appears to be having.  Reassessment of this patient and the ex-wife coming to the ER.  She states that the patient is at his baseline ambulatory level.  And further evaluation of past medical records as well as discussion with her and the nursing facility there is no significant change in the patient's ability to ambulate from his baseline level and he has physical therapy already arranged at his assisted living facility and we feel as we have not found any acute medical cause for his symptoms that he can be safely discharged back to his facility.  Nat Christen, MD 07/07/11 [phone]

## 2011-07-07 NOTE — ED Notes (Signed)
Pt presents with altered mental status X 1 day. Per staff at [facility] pt has altered mental status, diaphoresis, unstablr gait.Pt has a Hx of hydrocephalus.

## 2011-07-07 NOTE — ED Notes (Signed)
Large purple/green bruise covering pts right hip/thigh, extending down to knee noted. Pt states it is from falling. 2 bruises noted to pts back 4 inches in length 1 inch wide. One bruise to right scapula, one on left flank. Several scratches noted on pts back. Green/yellow bruise noted to left posterior forearm from wrist to elbow. Red raised area to back of head right side. MD aware

## 2011-07-07 NOTE — Discharge Instructions (Signed)
It is VERY important to implement strict fall precautions and address gait and balance disorder with ongoing therapy but return to ER for emergent changing or worsening of symptoms.   Fall Prevention, Elderly Falls are the leading cause of injuries, accidents, and accidental deaths in people over the age of 63. Falling is a real threat to your ability to live on your own. CAUSES   Poor eyesight or poor hearing can make you more likely to fall.   Illnesses and physical conditions can affect your strength and balance.   Poor lighting, throw rugs and pets in your home can make you more likely to trip or slip.   The side effects of some medicines can upset your balance and lead to falling. These include medicines for depression, sleep problems, high blood pressure, diabetes, and heart conditions.  PREVENTION  Be sure your home is as safe as possible. Here are some tips:  Wear shoes with non-skid soles (not house slippers).   Be sure your home and outside area are well lit.   Use night lights throughout your house, including hallways and stairways.   Remove clutter and clean up spills on floors and walkways.   Remove throw rugs or fasten them to the floor with carpet tape. Tack down carpet edges.   Do not place electrical cords across pathways.   Install grab bars in your bathtub, shower, and toilet area. Towel bars should not be used as a grab bar.   Install handrails on both sides of stairways.   Do not climb on stools or stepladders. Get someone else to help with jobs that require climbing.   Do not wax your floors at all, or use a non-skid wax.   Repair uneven or unsafe sidewalks, walkways or stairs.   Keep frequently used items within reach.   Be aware of pets so you do not trip.  Get regular check-ups from your doctor, and take good care of yourself:  Have your eyes checked every year for vision changes, cataracts, glaucoma, and other eye problems. Wear eyeglasses as  directed.   Have your hearing checked every 2 years, or anytime you or others think that you cannot hear well. Use hearing aids as directed.   See your caregiver if you have foot pain or corns. Sore feet can contribute to falls.   Let your caregiver know if a medicine is making you feel dizzy or making you lose your balance.   Use a cane, walker, or wheelchair as directed. Use walker or wheelchair brakes when getting in and out.   When you get up from bed, sit on the side of the bed for 1 to 2 minutes before you stand up. This will give your blood pressure time to adjust, and you will feel less dizzy.   If you need to go to the bathroom often, consider using a bedside commode.  Keep your body in good shape:  Get regular exercise, especially walking.   Do exercises to strengthen the muscles you use for walking and lifting.   Do not smoke.   Minimize use of alcohol.  SEEK IMMEDIATE MEDICAL CARE IF:   You feel dizzy, weak, or unsteady on your feet.   You feel confused.   You fall.  Document Released: 05/11/2005 Document Revised: 01/21/2011 Document Reviewed: 11/05/2006 [facility] Patient Information 2012 [place], [place].

## 2011-07-07 NOTE — ED Provider Notes (Signed)
Medical screening examination/treatment/procedure(s) were conducted as a shared visit with non-physician practitioner(s) and myself.  I personally evaluated the patient during the encounter   Sladen Plancarte A Taylon Louison, MD 07/07/11 1707 

## 2011-07-08 ENCOUNTER — Encounter (HOSPITAL_COMMUNITY): Payer: Self-pay | Admitting: Emergency Medicine

## 2011-07-08 ENCOUNTER — Emergency Department (HOSPITAL_COMMUNITY)
Admission: EM | Admit: 2011-07-08 | Discharge: 2011-07-08 | Disposition: A | Discharge status: Home / Self Care | Payer: Medicare Other | Attending: Emergency Medicine | Admitting: Emergency Medicine

## 2011-07-08 ENCOUNTER — Emergency Department (HOSPITAL_COMMUNITY): Payer: Medicare Other

## 2011-07-08 DIAGNOSIS — Z7982 Long term (current) use of aspirin: Secondary | ICD-10-CM | POA: Insufficient documentation

## 2011-07-08 DIAGNOSIS — R4182 Altered mental status, unspecified: Secondary | ICD-10-CM | POA: Insufficient documentation

## 2011-07-08 DIAGNOSIS — Y9210 Unspecified residential institution as the place of occurrence of the external cause: Secondary | ICD-10-CM | POA: Insufficient documentation

## 2011-07-08 DIAGNOSIS — S8010XA Contusion of unspecified lower leg, initial encounter: Secondary | ICD-10-CM | POA: Insufficient documentation

## 2011-07-08 DIAGNOSIS — R42 Dizziness and giddiness: Secondary | ICD-10-CM | POA: Insufficient documentation

## 2011-07-08 DIAGNOSIS — Z79899 Other long term (current) drug therapy: Secondary | ICD-10-CM | POA: Insufficient documentation

## 2011-07-08 DIAGNOSIS — F319 Bipolar disorder, unspecified: Secondary | ICD-10-CM | POA: Insufficient documentation

## 2011-07-08 DIAGNOSIS — F988 Other specified behavioral and emotional disorders with onset usually occurring in childhood and adolescence: Secondary | ICD-10-CM | POA: Insufficient documentation

## 2011-07-08 DIAGNOSIS — I2510 Atherosclerotic heart disease of native coronary artery without angina pectoris: Secondary | ICD-10-CM | POA: Insufficient documentation

## 2011-07-08 DIAGNOSIS — R5381 Other malaise: Secondary | ICD-10-CM | POA: Insufficient documentation

## 2011-07-08 DIAGNOSIS — Z8673 Personal history of transient ischemic attack (TIA), and cerebral infarction without residual deficits: Secondary | ICD-10-CM | POA: Insufficient documentation

## 2011-07-08 DIAGNOSIS — Z794 Long term (current) use of insulin: Secondary | ICD-10-CM | POA: Insufficient documentation

## 2011-07-08 DIAGNOSIS — S7000XA Contusion of unspecified hip, initial encounter: Secondary | ICD-10-CM | POA: Insufficient documentation

## 2011-07-08 DIAGNOSIS — R198 Other specified symptoms and signs involving the digestive system and abdomen: Secondary | ICD-10-CM | POA: Insufficient documentation

## 2011-07-08 DIAGNOSIS — E782 Mixed hyperlipidemia: Secondary | ICD-10-CM | POA: Insufficient documentation

## 2011-07-08 DIAGNOSIS — W19XXXA Unspecified fall, initial encounter: Secondary | ICD-10-CM | POA: Insufficient documentation

## 2011-07-08 DIAGNOSIS — S40029A Contusion of unspecified upper arm, initial encounter: Secondary | ICD-10-CM | POA: Insufficient documentation

## 2011-07-08 DIAGNOSIS — IMO0002 Reserved for concepts with insufficient information to code with codable children: Secondary | ICD-10-CM | POA: Insufficient documentation

## 2011-07-08 DIAGNOSIS — R5383 Other fatigue: Secondary | ICD-10-CM | POA: Insufficient documentation

## 2011-07-08 DIAGNOSIS — E119 Type 2 diabetes mellitus without complications: Secondary | ICD-10-CM | POA: Insufficient documentation

## 2011-07-08 LAB — COMPREHENSIVE METABOLIC PANEL
ALT: 24 U/L (ref 0–53)
AST: 20 U/L (ref 0–37)
Albumin: 3.6 g/dL (ref 3.5–5.2)
Alkaline Phosphatase: 134 U/L — ABNORMAL HIGH (ref 39–117)
BUN: 30 mg/dL — ABNORMAL HIGH (ref 6–23)
CO2: 25 mEq/L (ref 19–32)
Calcium: 9.7 mg/dL (ref 8.4–10.5)
Chloride: 106 mEq/L (ref 96–112)
Creatinine, Ser: 0.89 mg/dL (ref 0.50–1.35)
GFR calc Af Amer: >90 mL/min (ref >90)
GFR calc non Af Amer: 90 mL/min — ABNORMAL LOW (ref >90)
Glucose, Bld: 221 mg/dL — ABNORMAL HIGH (ref 70–99)
Potassium: 4.4 mEq/L (ref 3.5–5.1)
Sodium: 140 mEq/L (ref 135–145)
Total Bilirubin: 0.5 mg/dL (ref 0.3–1.2)
Total Protein: 6.1 g/dL (ref 6.0–8.3)

## 2011-07-08 LAB — CBC
HCT: 35.2 % — ABNORMAL LOW (ref 39.0–52.0)
Hemoglobin: 12.3 g/dL — ABNORMAL LOW (ref 13.0–17.0)
MCH: 29.6 pg (ref 26.0–34.0)
MCHC: 34.9 g/dL (ref 30.0–36.0)
MCV: 84.8 fL (ref 78.0–100.0)
Platelets: 227 10*3/uL (ref 150–400)
RBC: 4.15 MIL/uL — ABNORMAL LOW (ref 4.22–5.81)
RDW: 13.5 % (ref 11.5–15.5)
WBC: 10.2 10*3/uL (ref 4.0–10.5)

## 2011-07-08 NOTE — ED Notes (Signed)
Pt states has been falling frequently over past week. States unsure why. Pt bruising to right hip and thigh, both knees, has abrasion to forehead and both hands.

## 2011-07-08 NOTE — ED Notes (Signed)
Returned to bed w/o incident.

## 2011-07-08 NOTE — ED Notes (Signed)
Sitting on edge of bed, NAD, calm, alert, interactive, needing to use urinal, helped back to bed with assist, assisted with use of urinal, updated, re-oriented, pending disposition/ re-eval, CBIR.

## 2011-07-08 NOTE — ED Notes (Signed)
Pt out with PTAR to independent living at [facility] & wife contacted, notified & updated. Wife contacting agency for full time sitter until they can get him upgraded to higher level of care/ assisted living or skilled care. Wife attempting to coordinate sitter for tonight. HG & PTAR aware. No changes with pt, to b/r prior to to d/c with steady and tech assist & NT sitter for safety.

## 2011-07-08 NOTE — Discharge Instructions (Signed)
Return here as needed. Follow up with your doctor for a recheck and further assessment.
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
## 2011-07-08 NOTE — ED Provider Notes (Signed)
Patient has had multiple falls over past several days. Seen here yesterday for fall. On exam patient is alert denies pain anywhere HEENT exam abrasions to for head neck nontender lungs clear breath sounds abdomen nondistended nontender pelvis stable right lower extremity there is a hematoma today hip there is no pain on internal rotation of the thigh. Doubt hip fracture clinically  Doug Sou, MD 07/08/11 2150

## 2011-07-09 ENCOUNTER — Other Ambulatory Visit: Payer: Self-pay | Admitting: Physician Assistant

## 2011-07-09 ENCOUNTER — Other Ambulatory Visit: Payer: Self-pay | Admitting: Emergency Medicine

## 2011-07-09 NOTE — ED Provider Notes (Signed)
Medical screening examination/treatment/procedure(s) were conducted as a shared visit with non-physician practitioner(s) and myself.  I personally evaluated the patient during the encounter  Doug Sou, MD 07/09/11 [id number]

## 2011-07-10 ENCOUNTER — Other Ambulatory Visit: Payer: Self-pay | Admitting: *Deleted

## 2011-07-14 ENCOUNTER — Other Ambulatory Visit: Payer: Self-pay

## 2011-07-14 ENCOUNTER — Emergency Department (HOSPITAL_COMMUNITY): Payer: Medicare Other

## 2011-07-14 ENCOUNTER — Inpatient Hospital Stay (HOSPITAL_COMMUNITY)
Admission: EM | Admit: 2011-07-14 | Discharge: 2011-07-16 | DRG: 057 | Disposition: A | Discharge status: Skilled Nursing Facility | Payer: Medicare Other | Attending: Family Medicine | Admitting: Family Medicine

## 2011-07-14 ENCOUNTER — Encounter (HOSPITAL_COMMUNITY): Payer: Self-pay | Admitting: Emergency Medicine

## 2011-07-14 DIAGNOSIS — Y998 Other external cause status: Secondary | ICD-10-CM

## 2011-07-14 DIAGNOSIS — F988 Other specified behavioral and emotional disorders with onset usually occurring in childhood and adolescence: Secondary | ICD-10-CM | POA: Diagnosis present

## 2011-07-14 DIAGNOSIS — R5381 Other malaise: Secondary | ICD-10-CM | POA: Diagnosis present

## 2011-07-14 DIAGNOSIS — E119 Type 2 diabetes mellitus without complications: Secondary | ICD-10-CM | POA: Diagnosis present

## 2011-07-14 DIAGNOSIS — R269 Unspecified abnormalities of gait and mobility: Secondary | ICD-10-CM

## 2011-07-14 DIAGNOSIS — G609 Hereditary and idiopathic neuropathy, unspecified: Secondary | ICD-10-CM | POA: Diagnosis present

## 2011-07-14 DIAGNOSIS — Z87891 Personal history of nicotine dependence: Secondary | ICD-10-CM

## 2011-07-14 DIAGNOSIS — G4700 Insomnia, unspecified: Secondary | ICD-10-CM | POA: Diagnosis present

## 2011-07-14 DIAGNOSIS — W19XXXA Unspecified fall, initial encounter: Secondary | ICD-10-CM | POA: Diagnosis present

## 2011-07-14 DIAGNOSIS — Z8673 Personal history of transient ischemic attack (TIA), and cerebral infarction without residual deficits: Secondary | ICD-10-CM

## 2011-07-14 DIAGNOSIS — E785 Hyperlipidemia, unspecified: Secondary | ICD-10-CM | POA: Diagnosis present

## 2011-07-14 DIAGNOSIS — G910 Communicating hydrocephalus: Principal | ICD-10-CM | POA: Diagnosis present

## 2011-07-14 DIAGNOSIS — R4182 Altered mental status, unspecified: Secondary | ICD-10-CM | POA: Diagnosis present

## 2011-07-14 DIAGNOSIS — F0390 Unspecified dementia without behavioral disturbance: Secondary | ICD-10-CM

## 2011-07-14 DIAGNOSIS — Z9181 History of falling: Secondary | ICD-10-CM

## 2011-07-14 DIAGNOSIS — I2510 Atherosclerotic heart disease of native coronary artery without angina pectoris: Secondary | ICD-10-CM | POA: Diagnosis present

## 2011-07-14 DIAGNOSIS — F319 Bipolar disorder, unspecified: Secondary | ICD-10-CM | POA: Diagnosis present

## 2011-07-14 DIAGNOSIS — I10 Essential (primary) hypertension: Secondary | ICD-10-CM | POA: Diagnosis present

## 2011-07-14 DIAGNOSIS — S0101XA Laceration without foreign body of scalp, initial encounter: Secondary | ICD-10-CM

## 2011-07-14 DIAGNOSIS — Y9210 Unspecified residential institution as the place of occurrence of the external cause: Secondary | ICD-10-CM | POA: Diagnosis present

## 2011-07-14 HISTORY — DX: Personal history of other diseases of the digestive system: Z87.19

## 2011-07-14 HISTORY — DX: Anxiety disorder, unspecified: F41.9

## 2011-07-14 LAB — URINALYSIS, ROUTINE W REFLEX MICROSCOPIC
Leukocytes, UA: NEGATIVE
Protein, ur: NEGATIVE mg/dL
Urobilinogen, UA: 4 mg/dL — ABNORMAL HIGH (ref 0.0–1.0)

## 2011-07-14 LAB — CARDIAC PANEL(CRET KIN+CKTOT+MB+TROPI)
CK, MB: 2.8 ng/mL (ref 0.3–4.0)
CK, MB: 2.9 ng/mL (ref 0.3–4.0)
Relative Index: INVALID (ref 0.0–2.5)
Relative Index: INVALID (ref 0.0–2.5)
Total CK: 77 U/L (ref 7–232)
Total CK: 76 U/L (ref 7–232)
Troponin I: <0.3 ng/mL (ref <0.30)

## 2011-07-14 LAB — CBC
HCT: 32.8 % — ABNORMAL LOW (ref 39.0–52.0)
HCT: 34.9 % — ABNORMAL LOW (ref 39.0–52.0)
Hemoglobin: 11.3 g/dL — ABNORMAL LOW (ref 13.0–17.0)
Hemoglobin: 11.7 g/dL — ABNORMAL LOW (ref 13.0–17.0)
MCH: 29.7 pg (ref 26.0–34.0)
MCHC: 34.5 g/dL (ref 30.0–36.0)
MCV: 86.1 fL (ref 78.0–100.0)
MCV: 85.7 fL (ref 78.0–100.0)
RBC: 4.07 MIL/uL — ABNORMAL LOW (ref 4.22–5.81)
WBC: 6.6 10*3/uL (ref 4.0–10.5)

## 2011-07-14 LAB — CREATININE, SERUM
GFR calc Af Amer: >90 mL/min (ref >90)
GFR calc non Af Amer: >90 mL/min (ref >90)

## 2011-07-14 LAB — DIFFERENTIAL
Basophils Absolute: 0 10*3/uL (ref 0.0–0.1)
Basophils Relative: 0 % (ref 0–1)
Eosinophils Absolute: 0.3 10*3/uL (ref 0.0–0.7)
Lymphs Abs: 2 10*3/uL (ref 0.7–4.0)
Neutrophils Relative %: 58 % (ref 43–77)

## 2011-07-14 LAB — URINE DRUGS OF ABUSE SCREEN W ALC, ROUTINE (REF LAB)
Amphetamine Screen, Ur: POSITIVE — AB
Barbiturate Quant, Ur: NEGATIVE
Cocaine Metabolites: NEGATIVE
Marijuana Metabolite: NEGATIVE
Methadone: NEGATIVE
Propoxyphene: NEGATIVE

## 2011-07-14 LAB — BASIC METABOLIC PANEL
GFR calc Af Amer: >90 mL/min (ref >90)
GFR calc non Af Amer: >90 mL/min (ref >90)
Potassium: 3.4 mEq/L — ABNORMAL LOW (ref 3.5–5.1)
Sodium: 139 mEq/L (ref 135–145)

## 2011-07-14 LAB — GLUCOSE, CAPILLARY
Glucose-Capillary: 237 mg/dL — ABNORMAL HIGH (ref 70–99)
Glucose-Capillary: 366 mg/dL — ABNORMAL HIGH (ref 70–99)
Glucose-Capillary: 58 mg/dL — ABNORMAL LOW (ref 70–99)

## 2011-07-14 LAB — TROPONIN I: Troponin I: <0.3 ng/mL (ref <0.30)

## 2011-07-14 MED ORDER — INSULIN ASPART 100 UNIT/ML ~~LOC~~ SOLN
0.0000 [IU] | Freq: Three times a day (TID) | SUBCUTANEOUS | Status: DC
  Administered 2011-07-14: 15 [IU] via SUBCUTANEOUS
  Administered 2011-07-15: 2 [IU] via SUBCUTANEOUS
  Administered 2011-07-15 (×2): 5 [IU] via SUBCUTANEOUS
  Administered 2011-07-16: 15 [IU] via SUBCUTANEOUS
  Filled 2011-07-14: qty 3

## 2011-07-14 MED ORDER — MORPHINE SULFATE 4 MG/ML IJ SOLN
6.0000 mg | Freq: Once | INTRAMUSCULAR | Status: DC
  Filled 2011-07-14: qty 1

## 2011-07-14 MED ORDER — SODIUM CHLORIDE 0.9 % IJ SOLN
3.0000 mL | Freq: Two times a day (BID) | INTRAMUSCULAR | Status: DC
  Administered 2011-07-14 – 2011-07-16 (×4): 3 mL via INTRAVENOUS

## 2011-07-14 MED ORDER — ACETAMINOPHEN 325 MG PO TABS: 650.0000 mg | ORAL_TABLET | Freq: Four times a day (QID) | ORAL | Status: DC | PRN

## 2011-07-14 MED ORDER — FERROUS GLUCONATE 324 (38 FE) MG PO TABS
324.0000 mg | ORAL_TABLET | Freq: Every day | ORAL | Status: DC
  Administered 2011-07-15 – 2011-07-16 (×2): 324 mg via ORAL
  Filled 2011-07-14 (×3): qty 1

## 2011-07-14 MED ORDER — METFORMIN HCL 500 MG PO TABS
500.0000 mg | ORAL_TABLET | Freq: Two times a day (BID) | ORAL | Status: DC
  Administered 2011-07-14: 500 mg via ORAL
  Filled 2011-07-14 (×2): qty 1

## 2011-07-14 MED ORDER — BUPROPION HCL ER (XL) 300 MG PO TB24
450.0000 mg | ORAL_TABLET | Freq: Every day | ORAL | Status: DC
  Administered 2011-07-15 – 2011-07-16 (×2): 450 mg via ORAL
  Filled 2011-07-14 (×2): qty 1

## 2011-07-14 MED ORDER — METOPROLOL SUCCINATE ER 25 MG PO TB24
25.0000 mg | ORAL_TABLET | Freq: Every day | ORAL | Status: DC
  Administered 2011-07-15 – 2011-07-16 (×2): 25 mg via ORAL
  Filled 2011-07-14 (×2): qty 1

## 2011-07-14 MED ORDER — ACETAMINOPHEN 650 MG RE SUPP: 650.0000 mg | Freq: Four times a day (QID) | RECTAL | Status: DC | PRN

## 2011-07-14 MED ORDER — ADULT MULTIVITAMIN W/MINERALS CH
1.0000 | ORAL_TABLET | Freq: Every day | ORAL | Status: DC
  Administered 2011-07-15 – 2011-07-16 (×2): 1 via ORAL
  Filled 2011-07-14 (×2): qty 1

## 2011-07-14 MED ORDER — INSULIN GLARGINE 100 UNIT/ML ~~LOC~~ SOLN
10.0000 [IU] | Freq: Every day | SUBCUTANEOUS | Status: DC
  Administered 2011-07-15: 10 [IU] via SUBCUTANEOUS
  Filled 2011-07-14: qty 3

## 2011-07-14 MED ORDER — SODIUM CHLORIDE 0.9 % IV SOLN: 250.0000 mL | INTRAVENOUS | Status: DC | PRN

## 2011-07-14 MED ORDER — SODIUM CHLORIDE 0.9 % IJ SOLN: 3.0000 mL | INTRAMUSCULAR | Status: DC | PRN

## 2011-07-14 MED ORDER — PANTOPRAZOLE SODIUM 40 MG PO TBEC
40.0000 mg | DELAYED_RELEASE_TABLET | Freq: Every day | ORAL | Status: DC
  Administered 2011-07-15 – 2011-07-16 (×2): 40 mg via ORAL
  Filled 2011-07-14 (×3): qty 1

## 2011-07-14 MED ORDER — LORAZEPAM 1 MG PO TABS
1.0000 mg | ORAL_TABLET | Freq: Once | ORAL | Status: AC
  Administered 2011-07-14: 1 mg via ORAL

## 2011-07-14 MED ORDER — AMPHETAMINE-DEXTROAMPHET ER 10 MG PO CP24
20.0000 mg | ORAL_CAPSULE | Freq: Every day | ORAL | Status: DC
  Administered 2011-07-15 – 2011-07-16 (×2): 20 mg via ORAL
  Filled 2011-07-14 (×2): qty 2

## 2011-07-14 MED ORDER — GLUCOSE 40 % PO GEL
ORAL | Status: AC
  Administered 2011-07-14: 37.5 g
  Filled 2011-07-14: qty 1

## 2011-07-14 MED ORDER — LORAZEPAM 0.5 MG PO TABS
0.5000 mg | ORAL_TABLET | Freq: Two times a day (BID) | ORAL | Status: DC
  Administered 2011-07-14: 0.5 mg via ORAL

## 2011-07-14 MED ORDER — INSULIN GLARGINE 100 UNIT/ML ~~LOC~~ SOLN
30.0000 [IU] | Freq: Every day | SUBCUTANEOUS | Status: DC
  Administered 2011-07-14: 30 [IU] via SUBCUTANEOUS
  Filled 2011-07-14: qty 3

## 2011-07-14 MED ORDER — LORAZEPAM 0.5 MG PO TABS
0.5000 mg | ORAL_TABLET | Freq: Every day | ORAL | Status: DC
  Administered 2011-07-15 – 2011-07-16 (×2): 0.5 mg via ORAL
  Filled 2011-07-14 (×3): qty 1

## 2011-07-14 MED ORDER — ENOXAPARIN SODIUM 40 MG/0.4ML ~~LOC~~ SOLN
40.0000 mg | SUBCUTANEOUS | Status: DC
  Administered 2011-07-14 – 2011-07-15 (×2): 40 mg via SUBCUTANEOUS
  Filled 2011-07-14 (×3): qty 0.4

## 2011-07-14 MED ORDER — INSULIN GLARGINE 100 UNIT/ML ~~LOC~~ SOLN: 20.0000 [IU] | Freq: Two times a day (BID) | SUBCUTANEOUS | Status: DC

## 2011-07-14 MED ORDER — OXCARBAZEPINE 300 MG PO TABS
300.0000 mg | ORAL_TABLET | Freq: Two times a day (BID) | ORAL | Status: DC
  Administered 2011-07-14 – 2011-07-16 (×4): 300 mg via ORAL
  Filled 2011-07-14 (×5): qty 1

## 2011-07-14 MED ORDER — FERROUS GLUCONATE 324 (38 FE) MG PO TABS: 325.0000 mg | ORAL_TABLET | Freq: Every day | ORAL | Status: DC

## 2011-07-14 MED ORDER — INSULIN ASPART 100 UNIT/ML ~~LOC~~ SOLN
0.0000 [IU] | Freq: Three times a day (TID) | SUBCUTANEOUS | Status: DC
  Filled 2011-07-14: qty 3

## 2011-07-14 MED ORDER — ASPIRIN EC 81 MG PO TBEC
81.0000 mg | DELAYED_RELEASE_TABLET | Freq: Every day | ORAL | Status: DC
  Administered 2011-07-14: 81 mg via ORAL

## 2011-07-14 MED ORDER — SIMVASTATIN 20 MG PO TABS
20.0000 mg | ORAL_TABLET | Freq: Every day | ORAL | Status: DC
  Administered 2011-07-14 – 2011-07-15 (×2): 20 mg via ORAL
  Filled 2011-07-14 (×3): qty 1

## 2011-07-14 MED ORDER — LORAZEPAM 0.5 MG PO TABS
3.0000 mg | ORAL_TABLET | Freq: Every day | ORAL | Status: DC
  Administered 2011-07-14 – 2011-07-15 (×2): 3 mg via ORAL
  Filled 2011-07-14 (×2): qty 6

## 2011-07-14 MED ORDER — HYDROXYZINE HCL 25 MG PO TABS
25.0000 mg | ORAL_TABLET | Freq: Every day | ORAL | Status: DC
  Administered 2011-07-14 – 2011-07-15 (×2): 25 mg via ORAL
  Filled 2011-07-14 (×3): qty 1

## 2011-07-14 MED ORDER — INSULIN GLARGINE 100 UNIT/ML ~~LOC~~ SOLN: 20.0000 [IU] | Freq: Every day | SUBCUTANEOUS | Status: DC

## 2011-07-14 MED ORDER — DULOXETINE HCL 30 MG PO CPEP
90.0000 mg | ORAL_CAPSULE | Freq: Every day | ORAL | Status: DC
  Administered 2011-07-15 – 2011-07-16 (×2): 90 mg via ORAL
  Filled 2011-07-14 (×2): qty 1

## 2011-07-14 MED ORDER — ASPIRIN EC 81 MG PO TBEC
81.0000 mg | DELAYED_RELEASE_TABLET | Freq: Every day | ORAL | Status: DC
  Administered 2011-07-15 – 2011-07-16 (×2): 81 mg via ORAL
  Filled 2011-07-14 (×2): qty 1

## 2011-07-14 MED ORDER — LORAZEPAM 1 MG PO TABS
ORAL_TABLET | ORAL | Status: AC
  Administered 2011-07-14: 1 mg via ORAL
  Filled 2011-07-14: qty 1

## 2011-07-14 NOTE — ED Notes (Signed)
Per admitting MD, pulse ox removed from pt.

## 2011-07-14 NOTE — ED Notes (Signed)
Consulting MD at bedside
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
## 2011-07-14 NOTE — ED Notes (Signed)
Bed ready, will wait for admit orders to call report

## 2011-07-14 NOTE — ED Notes (Signed)
Pt states he fell sometime yesterday staff at assist living  States ~3 am this am  Has lac to top of head pt confused as to event and time pt awake and lying in bed upon ems arrival

## 2011-07-14 NOTE — H&P (Signed)
Family Medicine Teaching Service Attending Note  I interviewed and examined patient Retana and reviewed their tests and x-rays.  I discussed with Dr. Tye Savoy and reviewed their note for today.  I agree with their assessment and plan.     Additionally  Currently resting easily in bed. He asks when he will have surgery and what is it for.   I explained that surgery is not planned and that he is here for falling.   Two minutes later he asks when is the surgery Orientation Jan-1903- [facility]  2/3 objects at 5 minutes Finger to nose - mild dysmetria on the left Heel to shin - mild dysmetria bilaterally Str 5/5 and equal throughout  Memory and Falling - need to find out what is his current diagnosis and previous work up including neurology assessment.    Competence - seems to have inability to process new information and his memory is intermittent.  Consult Psychiatry for assessment of competence and suggestions for his psychotropic medications
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
## 2011-07-14 NOTE — ED Notes (Signed)
Urine sent a Add ON

## 2011-07-14 NOTE — ED Notes (Signed)
Patient ambulated with minimal assist using a rolling blood pressure machine for assistance.  Patient stated he uses a walker at home.
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
## 2011-07-15 DIAGNOSIS — F988 Other specified behavioral and emotional disorders with onset usually occurring in childhood and adolescence: Secondary | ICD-10-CM

## 2011-07-15 DIAGNOSIS — F319 Bipolar disorder, unspecified: Secondary | ICD-10-CM

## 2011-07-15 LAB — COMPREHENSIVE METABOLIC PANEL
ALT: 30 U/L (ref 0–53)
AST: 28 U/L (ref 0–37)
Albumin: 3.2 g/dL — ABNORMAL LOW (ref 3.5–5.2)
CO2: 27 mEq/L (ref 19–32)
Chloride: 106 mEq/L (ref 96–112)
GFR calc non Af Amer: >90 mL/min (ref >90)
Sodium: 141 mEq/L (ref 135–145)
Total Bilirubin: 0.3 mg/dL (ref 0.3–1.2)

## 2011-07-15 LAB — CBC
MCH: 28.7 pg (ref 26.0–34.0)
Platelets: 239 10*3/uL (ref 150–400)
RBC: 4.01 MIL/uL — ABNORMAL LOW (ref 4.22–5.81)
RDW: 13.8 % (ref 11.5–15.5)
WBC: 7.9 10*3/uL (ref 4.0–10.5)

## 2011-07-15 LAB — HEMOGLOBIN A1C
Hgb A1c MFr Bld: 8 % — ABNORMAL HIGH (ref <5.7)
Mean Plasma Glucose: 183 mg/dL — ABNORMAL HIGH (ref <117)

## 2011-07-15 LAB — GLUCOSE, CAPILLARY
Glucose-Capillary: 149 mg/dL — ABNORMAL HIGH (ref 70–99)
Glucose-Capillary: 233 mg/dL — ABNORMAL HIGH (ref 70–99)

## 2011-07-15 LAB — URINE CULTURE

## 2011-07-15 MED ORDER — INSULIN GLARGINE 100 UNIT/ML ~~LOC~~ SOLN
5.0000 [IU] | Freq: Every morning | SUBCUTANEOUS | Status: DC
  Administered 2011-07-16: 5 [IU] via SUBCUTANEOUS
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
## 2011-07-15 NOTE — ED Provider Notes (Signed)
I saw and evaluated the patient, reviewed the resident's note and EKG reading and I agree with the findings and plan.  61yo M, seen in ED multiple times in the past week for falls, fell again sometime overnight last night.  +lac to head, multiple ecchymosis in various stages of healing over body and extremities.  Pt does not recall events PTA.  Hx confusion/poor historian per baseline.  EPIC chart review: pt living in independent living, wife was to obtain sitter for pt while trying to upgrade pt to higher level of care at same facility.  Pt's wife called, states pt "told the sitter to leave" and has been living alone, she concurs that she too is concerned regarding pt's safety.  Lac repaired.  Will admit due to multiple falls, unk if syncopal event overnight, as well as need to place pt in higher level of care for his safety.   Laray Anger, DO 07/15/11 [place]

## 2011-07-15 NOTE — Evaluation (Signed)
Quandre Polinski [place],PT Acute Rehabilitation [phone] (pager)  

## 2011-07-15 NOTE — Progress Notes (Signed)
PGY-1 Daily Progress Note Family Medicine Teaching Service D. Piloto Rolene Arbour, MD Service Pager: [phone]  Patient name: Kyle [name]  Medical record [id number] Date of birth:1951/03/25 Age: 61 y.o. Gender: male  LOS: 1 day   Subjective:  Does not want to stay. Last night we had to order a sitter due to agitation and pt no cooperative wanted to use the restroom. (pt is fall precaution) normal PO intake.  Objective:  Vitals: Temp:  [98.1 F (36.7 C)-98.4 F (36.9 C)] 98.4 F (36.9 C) (02/20 0550) Pulse Rate:  [66-136] 70  (02/20 0550) Resp:  [18-20] 20  (02/20 0550) BP: (140-156)/(69-98) 148/72 mmHg (02/20 0550) SpO2:  [99 %-100 %] 100 % (02/20 0550) Weight:  [179 lb 12.8 oz (81.557 kg)-181 lb (82.1 kg)] 181 lb (82.1 kg) (02/20 0550)  Intake/Output Summary (Last 24 hours) at 07/15/11 0924 Last data filed at 07/15/11 0825  Gross per 24 hour  Intake    840 ml  Output    300 ml  Net    540 ml   Physical Exam: Gen:  NAD HEENT: Moist mucous membranes CV: Regular rate and rhythm, no murmurs rubs or gallops PULM: Clear to auscultation bilaterally. No wheezes/rales/rhonchi ABD: Soft, non tender, non distended, normal bowel sounds EXT: No edema Neuro: Alert and oriented in person and place, agitated. No difference on weakness bilaterally but is 4/5 sthregh in lower extremities. His neurologic exam is limited due to pt uncooperative.  Labs and imaging:  CBC  Lab 07/15/11 0545 07/14/11 1502 07/14/11 0926  WBC 7.9 6.6 6.7  HGB 11.5* 11.7* 11.3*  HCT 34.4* 34.9* 32.8*  PLT 239 248 224   BMET  Lab 07/15/11 0545 07/14/11 1502 07/14/11 0926 07/08/11 1746  NA 141 -- 139 140  K 3.4* -- 3.4* 4.4  CL 106 -- 104 106  CO2 27 -- 28 25  BUN 20 -- 21 30*  CREATININE 0.88 0.80 0.82 --  LABGLOM -- -- -- --  GLUCOSE 143* -- -- --  CALCIUM 9.0 -- 9.1 9.7   URINALYSIS, ROUTINE W REFLEX MICROSCOPIC     Status: Abnormal   Collection Time   07/14/11 11:03 AM      Component  Value Range   Color, Urine YELLOW  YELLOW    APPearance CLEAR  CLEAR    Specific Gravity, Urine 1.020  1.005 - 1.030    pH 7.0  5.0 - 8.0    Glucose, UA 100 (*) NEGATIVE (mg/dL)   Hgb urine dipstick NEGATIVE  NEGATIVE    Bilirubin Urine SMALL (*) NEGATIVE    Ketones, ur 15 (*) NEGATIVE (mg/dL)   Protein, ur NEGATIVE  NEGATIVE (mg/dL)   Urobilinogen, UA 4.0 (*) 0.0 - 1.0 (mg/dL)   Nitrite NEGATIVE  NEGATIVE    Leukocytes, UA NEGATIVE  NEGATIVE   URINE CULTURE     Status: Normal   Collection Time   07/14/11 11:03 AM      Component Value Range   Colony Count NO GROWTH     Culture NO GROWTH     Report Status 07/15/2011 FINAL    DRUGS OF ABUSE SCREEN W ALC, ROUTINE URINE     Status: Abnormal   Collection Time   07/14/11 11:18 AM      Component Value Range   Amphetamine Screen, Ur POSITIVE (*) Negative    Troponin I WNL  CBG'S: Results for ASHRITH, SAGAN (MRN [id number]) as of 07/15/2011 14:55  07/14/2011 09:39 07/14/2011 16:42 07/14/2011 21:29  07/14/2011 21:33 07/14/2011 22:04 07/15/2011 06:40 07/15/2011 11:38  Glucose-Capillary 237 (H) 366 (H) 60 (L) 58 (L) 103 (H) 149 (H) 247 (H)    Medications: Medication Dose Route Frequency  . 0.9 %  sodium chloride infusion  250 mL Intravenous PRN  . acetaminophen (TYLENOL) tablet 650 mg  650 mg Oral Q6H PRN    . acetaminophen (TYLENOL) suppository 650 mg  650 mg Rectal Q6H PRN  . amphetamine-dextroamphetamine (ADDERALL XR) 24 hr capsule 20 mg  20 mg Oral Q breakfast  . aspirin EC tablet 81 mg  81 mg Oral Daily  . buPROPion (WELLBUTRIN XL) 450 mg  450 mg Oral Daily  . dextrose (GLUTOSE) 40 % oral gel      . DULoxetine (CYMBALTA) DR capsule 90 mg  90 mg Oral Daily  . enoxaparin (LOVENOX) injection 40 mg  40 mg Subcutaneous Q24H  . ferrous gluconate (FERGON) tablet 324 mg  324 mg Oral Q breakfast  . hydrOXYzine (ATARAX/VISTARIL) tablet 25 mg  25 mg Oral QHS  . insulin aspart (novoLOG) injection 0-15 Units  0-15 Units Subcutaneous TID WC  .  insulin glargine (LANTUS) injection 10 Units  10 Units Subcutaneous Daily  . LORazepam (ATIVAN) tablet 0.5 mg  0.5 mg Oral Daily  . LORazepam (ATIVAN) tablet 1 mg  1 mg Oral Once  . LORazepam (ATIVAN) tablet 3 mg  3 mg Oral QHS  . metoprolol succinate (TOPROL-XL) 24 hr tablet 25 mg  25 mg Oral Daily  . mulitivitamin with minerals tablet 1 tablet  1 tablet Oral Daily  . Oxcarbazepine (TRILEPTAL) tablet 300 mg  300 mg Oral BID  . pantoprazole (PROTONIX) EC tablet 40 mg  40 mg Oral Q1200  . simvastatin (ZOCOR) tablet 20 mg  20 mg Oral q1800  . sodium chloride 0.9 % injection 3 mL  3 mL Intravenous Q12H  . sodium chloride 0.9 % injection 3 mL  3 mL Intravenous PRN   Assessment and Plan:  1. Recurrent falls:Pt has multiple neurologic condition as possible cause of fall: he is followed  by Dr. Francesca Oman [phone]) neurology. 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 His next appointment with neurology  is confirmed to be on 3/13 at 11:45 pm. - No changes on EKG since previous and troponin I are negative times. Will consider d/c continuous cardiac monitoring. Hb anemic but stable. - Urine Cx with no growth. - UDS positive for amphetamines and this corresponds with his ambulatory meds. - Per ex-wife Erskine Squibb)  [id number] this is his baseline.  2. DM: on Lantus 10 units and SSI. We are concerned with compliance so we stopped his metformin and will monitor and adjust lantus per CBG's. Will add 5 unit of Lantus am.  3. Psych: diagnosis of Bipolar and ADD. Pt on Wellbutrin, Cymbalta, Adderall and Trileptal. We are concerned that pt may no be competent to make his own decisions. We await Phychiatric consult to help with baseline condition and evaluate for competency.  4. HTN: on toprol. BP stable.  5. Hyperlipidemia: continue on  statin  FEN/GI: CHO diet Prophylaxis:  lovenox Disposition:  Pending psychiatry consult and placement since PT recommended SNF.  D. Pil[place] Rolene Arbour, MD PGY1, [facility] Medicine Teaching Service Pager [phone] 07/15/2011

## 2011-07-15 NOTE — Progress Notes (Signed)
Clinical Child psychotherapist (CSW) completed psychosocial assessment which can be found in pt shadow chart. CSW spoke with pt who confirmed he was from [facility]. CSW informed pt that pt would need a higher level of care such as skilled placement once dc'ing the hospital. The pt appeared confused and constantly asked, "who makes these decisions, and how long will I be there.?" CSW informed the pt that PT has recommended skilled placement and that his progress would determine how long he would need to be there. The pt provided consent for CSW to speak to his ex wife Erskine Squibb [phone] however he would ultimately make the decision whether he would leave his home. CSW contacted Erskine Squibb who confirmed pt has been presenting with some confusion and that she has been assisting in getting pt into the ALF at [facility] for sometime now. CSW contacted [facility] who informed CSW that they are familiar with pt and the amount of care he needs and will be able to offer him placement. Facility confirmed several times they can meet pt needs and pt is okay to go to the ALF at dc. CSW informed Erskine Squibb who was appreciative of assistance. CSW will complete FL2 and facilitate dc when pt medically stable.  Theresia Bough, MSW, Theresia Majors [phone]

## 2011-07-15 NOTE — Progress Notes (Signed)
CBG: 59  Treatment: 15 GM gel  Symptoms: None  Follow-up CBG: Time 2200 CBG Result:103 Possible Reasons for Event: Inadequate meal intake  Comments/MD notified: no    Kyle [name], [employer]
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
## 2011-07-15 NOTE — Progress Notes (Signed)
Inpatient Diabetes Program Recommendations  AACE/ADA: New Consensus Statement on Inpatient Glycemic Control (2009)  Target Ranges:  Prepandial:   less than 140 mg/dL      Peak postprandial:   less than 180 mg/dL (1-2 hours)      Critically ill patients:  140 - 180 mg/dL   Reason for assessment:  Hypoglycemia CBG=58 last night.  Inpatient Diabetes Program Recommendations Insulin - Basal: Increase Lantus to at least 1/2 home dose. Home dose is a total of 50 units.  Note: Hypoglycemic event last night most likely related to the Novolog 15 units given for a CBG=366 and not the Lantus.  Will continue to follow. Thank you  Piedad Climes RN,BSN,CDE Inpatient Diabetes Coordinator

## 2011-07-16 LAB — GLUCOSE, CAPILLARY: Glucose-Capillary: 263 mg/dL — ABNORMAL HIGH (ref 70–99)

## 2011-07-16 MED ORDER — POTASSIUM CHLORIDE CRYS ER 20 MEQ PO TBCR
40.0000 meq | EXTENDED_RELEASE_TABLET | Freq: Once | ORAL | Status: AC
  Administered 2011-07-16: 40 meq via ORAL
  Filled 2011-07-16 (×2): qty 1

## 2011-07-16 MED ORDER — METFORMIN HCL 500 MG PO TABS
500.0000 mg | ORAL_TABLET | Freq: Two times a day (BID) | ORAL | Status: DC
Start: 2011-07-16 — End: 2011-07-16
  Administered 2011-07-16: 500 mg via ORAL
  Filled 2011-07-16 (×3): qty 1

## 2011-07-16 NOTE — Discharge Summary (Signed)
I have reviewed this discharge summary and agree.    

## 2011-07-16 NOTE — Progress Notes (Signed)
Family Medicine Teaching Service Attending Note  I interviewed and examined patient Mctigue and reviewed their tests and x-rays.  I discussed with Dr. Aviva Signs and reviewed their note for today.  I agree with their assessment and plan.     Additionally  I saw on 2/20 AM Await information on competency and placement

## 2011-07-16 NOTE — Discharge Summary (Signed)
Physician Discharge Summary  Patient ID: Kyle [name] [id number] 1951/04/28 61 y.o.  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 Bipolar 5. HTN 6. DM 7. HLP   Discharge Medications  [name], [name]  Home Medication Instructions [id number]   Printed on:07/16/11 1323  Medication Information                    amphetamine-dextroamphetamine (ADDERALL XR) 10 MG 24 hr capsule Take 20 mg by mouth every morning.             LORazepam (ATIVAN) 1 MG tablet Take 0.5-3 mg by mouth 2 (two) times daily. Take 0.5 tablet (0.5 MG) in the morning, and 3 tablets (3 MG) at bedtime.           buPROPion (WELLBUTRIN XL) 150 MG 24 hr tablet Take 450 mg by mouth every morning.             hydrOXYzine (ATARAX/VISTARIL) 25 MG tablet Take 25 mg by mouth at bedtime.             atorvastatin (LIPITOR) 80 MG tablet Take 80 mg by mouth daily.            Oxcarbazepine (TRILEPTAL) 300 MG tablet Take 300 mg by mouth 2 (two) times daily.             dexlansoprazole (DEXILANT) 60 MG capsule Take 60 mg by mouth daily.             Calcium Carbonate-Vitamin D (CALCIUM + D PO) Take 1 tablet by mouth at bedtime.            aspirin EC 81 MG tablet Take 81 mg by mouth daily.             DULoxetine (CYMBALTA) 30 MG capsule Take 90 mg by mouth daily.           Multiple Vitamin (MULITIVITAMIN WITH MINERALS) TABS Take 1 tablet by mouth daily.           metoprolol succinate (TOPROL-XL) 25 MG 24 hr tablet Take 25 mg by mouth daily.           metFORMIN (GLUCOPHAGE) 500 MG tablet Take 500 mg by mouth 2 (two) times daily with a meal.           insulin glargine (LANTUS) 100 UNIT/ML injection Inject 20-30 Units into the skin 2 (two) times daily. 30 units in the morning and 20 units with thew evening meal           ferrous gluconate (FERGON) 325 MG  tablet Take 325 mg by mouth daily with breakfast.             Consults: Psychiatry Dr. Ferol Luz  Labs: CBC  Lab 07/15/11 0545 07/14/11 1502 07/14/11 0926  WBC 7.9 6.6 6.7  HGB 11.5* 11.7* 11.3*  HCT 34.4* 34.9* 32.8*  PLT 239 248 224   BMET  Lab 07/15/11 0545 07/14/11 1502 07/14/11 0926  NA 141 -- 139  K 3.4* -- 3.4*  CL 106 -- 104  CO2 27 -- 28  BUN 20 -- 21  CREATININE 0.88 0.80 0.82  CALCIUM 9.0 -- 9.1  PROT 5.7* -- --  BILITOT 0.3 -- --  ALKPHOS 134* -- --  ALT 30 -- --  AST 28 -- --  GLUCOSE 143* -- 228*   TROPONIN I  Status: Normal   Collection Time   07/14/11  9:27 AM      Component Value Range Comment   Troponin I <0.30  <0.30 (ng/mL)   URINALYSIS, ROUTINE W REFLEX MICROSCOPIC     Status: Abnormal   Collection Time   07/14/11 11:03 AM      Component Value Range Comment   Color, Urine YELLOW  YELLOW     APPearance CLEAR  CLEAR     Specific Gravity, Urine 1.020  1.005 - 1.030     pH 7.0  5.0 - 8.0     Glucose, UA 100 (*) NEGATIVE (mg/dL)    Hgb urine dipstick NEGATIVE  NEGATIVE     Bilirubin Urine SMALL (*) NEGATIVE     Ketones, ur 15 (*) NEGATIVE (mg/dL)    Protein, ur NEGATIVE  NEGATIVE (mg/dL)    Urobilinogen, UA 4.0 (*) 0.0 - 1.0 (mg/dL)    Nitrite NEGATIVE  NEGATIVE     Leukocytes, UA NEGATIVE  NEGATIVE  MICROSCOPIC NOT DONE ON URINES WITH NEGATIVE PROTEIN, BLOOD, LEUKOCYTES, NITRITE, OR GLUCOSE <1000 mg/dL.  URINE CULTURE     Status: Normal   Collection Time   07/14/11 11:03 AM      Component Value Range Comment   Specimen Description URINE, RANDOM      Special Requests NONE      Culture  Setup Time [id number]      Colony Count NO GROWTH      Culture NO GROWTH      Report Status 07/15/2011 FINAL     DRUGS OF ABUSE SCREEN W ALC, ROUTINE URINE     Status: Abnormal   Collection Time   07/14/11 11:18 AM      Component Value Range Comment   Amphetamine Screen, Ur POSITIVE (*) Negative     Marijuana Metabolite NEGATIVE  Negative      Barbiturate Quant, Ur NEGATIVE  Negative     Methadone NEGATIVE  Negative     Propoxyphene NEGATIVE  Negative     Benzodiazepines. NEGATIVE  Negative     Phencyclidine (PCP) NEGATIVE  Negative     Cocaine Metabolites NEGATIVE  Negative     Opiate Screen, Urine NEGATIVE  Negative     Ethyl Alcohol <10  <10 (mg/dL)    Creatinine,U 366.4     CARDIAC PANEL(CRET KIN+CKTOT+MB+TROPI)     Status: Normal   Collection Time   07/14/11 12:25 PM      Component Value Range Comment   Total CK 77  7 - 232 (U/L)    CK, MB 2.8  0.3 - 4.0 (ng/mL)    Troponin I <0.30  <0.30 (ng/mL)    Relative Index RELATIVE INDEX IS INVALID  0.0 - 2.5    CARDIAC PANEL(CRET KIN+CKTOT+MB+TROPI)     Status: Normal   Collection Time   07/14/11  3:02 PM      Component Value Range Comment   Total CK 76  7 - 232 (U/L)    CK, MB 2.9  0.3 - 4.0 (ng/mL)    Troponin I <0.30  <0.30 (ng/mL)    Relative Index RELATIVE INDEX IS INVALID  0.0 - 2.5    MRSA PCR SCREENING     Status: Normal   Collection Time   07/14/11  3:28 PM      Component Value Range Comment   MRSA by PCR NEGATIVE  NEGATIVE    GLUCOSE, CAPILLARY     Status: Abnormal   Collection Time   07/14/11  4:42 PM      Component Value Range Comment   Glucose-Capillary 366 (*) 70 - 99 (mg/dL)    Comment 1 Notify RN     HEMOGLOBIN A1C     Status: Abnormal   Collection Time   07/14/11  5:02 PM      Component Value Range Comment   Hemoglobin A1C 8.0 (*) <5.7 (%)    Mean Plasma Glucose 183 (*) <117 (mg/dL)   CARDIAC PANEL(CRET KIN+CKTOT+MB+TROPI)     Status: Normal   Collection Time   07/14/11  8:57 PM      Component Value Range Comment   Total CK 77  7 - 232 (U/L)    CK, MB 2.5  0.3 - 4.0 (ng/mL)    Troponin I <0.30  <0.30 (ng/mL)    Relative Index RELATIVE INDEX IS INVALID  0.0 - 2.5      Procedures/Imaging:  Dg Chest 2 View  07/14/2011  *RADIOLOGY REPORT*  Clinical Data: Fall.  Dizzy.  CHEST - 2 VIEW  Comparison: Two-view chest 07/07/2011.  Findings: The heart size  is normal.  The lungs are clear.  Post- traumatic changes of the left ribs and right clavicle are stable. The visualized soft tissues and bony thorax are otherwise unremarkable.  IMPRESSION:  1.  No acute cardiopulmonary disease or significant interval change. 2.  Post-traumatic changes of the left posterior ribs and right clavicle.  Original Report Authenticated By: Jamesetta Orleans. MATTERN, M.D.   Ct Head Wo Contrast  07/14/2011  *RADIOLOGY REPORT*  Clinical Data: Fall.  Laceration to top of head.  Confusion.  CT HEAD WITHOUT CONTRAST  Technique:  Contiguous axial images were obtained from the base of the skull through the vertex without contrast.  Comparison: CT head without contrast 07/07/2011.  Findings: Remote lacunar infarcts of the basal ganglia are stable. White matter changes extend into the posterior left corona radiata. The mild periventricular white matter changes are stable as well. No acute cortical infarct, hemorrhage, or mass lesion is present. The ventricles are proportionate to the degree of atrophy and stable.  No significant extra-axial fluid collection is present.  The paranasal sinuses and mastoid air cells are clear.  The osseous skull is intact.  IMPRESSION:  1.  No acute intracranial abnormality or significant interval change. 2.  Stable atrophy and white matter disease. 3.  Stable remote lacunar infarcts of the basal ganglia bilaterally.  Original Report Authenticated By: Jamesetta Orleans. MATTERN, M.D.   Brief Hospital Course:  61 y/o white male admitted to our service  for recurrent falls.  Pt has multiple neurologic condition as possible cause of fall: He is followed by Kyle [name] [phone]) neurology. 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 His next appointment with neurology is confirmed to be on 3/13 at 11:45 pm.  Pt full cardiovascular work up negative as cause of falls at this time. Pt is medically stable to go to ALF on PT?OT and fall precautions. Psych: diagnosis of Bipolar and ADD. Pt on Wellbutrin, Cymbalta, Adderall and Trileptal. . Psychiatry evaluate pt and diagnosed competency. Pt agreeable to go to ALF  DM ,HTN , Hyperlipidemia: No changes on his current  home regimen. A1C elevated . Will differ to his PCP Dr. Cleta Alberts.   Patient condition at time of discharge/disposition:  Patient is discharge on stable medical condition to  Assisted Living Facility with Pt/OT.    Discharge follow up:  Discharge Orders    Future Appointments: Provider: Department: Dept Phone: Center:   10/06/2011 11:45 AM Kyle Edin, MD [facility] [phone] [facility]      D. Piloto Rolene Arbour, MD  [facility] 07/16/2011

## 2011-07-16 NOTE — Progress Notes (Signed)
Pt's novolog insulin not available at 7am , just received from pharmacy.  Dr. Windell Hummingbird informed CBG was 263mg /dl this am and off going shift nurse not able to cover.  MD instructed ok to hold 7am SSI.  Amanda Pea, Charity fundraiser.

## 2011-07-16 NOTE — Progress Notes (Signed)
PGY-1 Daily Progress Note Family Medicine Teaching Service D. Piloto Rolene Arbour, MD Service Pager: [phone]  Patient name: Kyle [name]  Medical record [id number] Date of birth:1950-11-22 Age: 61 y.o. Gender: male  LOS: 2 days   Subjective: feeling well, good appetite. Wants to go to [facility])  Objective:  Vitals: Temp:  97.5 F (36.4 C) (02/21 0635) Pulse Rate: 56  (02/21 0635) Resp: 16  (02/21 0635) BP: 118/64 mmHg (02/21 0635) SpO2:   98 % (02/21 0635) Weight:  181 lb 7 oz (82.3 kg) (02/21 5621)  Physical Exam: Gen: NAD  HEENT: Moist mucous membranes  CV: Regular rate and rhythm, no murmurs rubs or gallops  PULM: Clear to auscultation bilaterally. No wheezes/rales/rhonchi  ABD: Soft, non tender, non distended, normal bowel sounds  EXT: No edema  Neuro: Alert and oriented in person and place calmed today . No difference on weakness bilaterally but is 4/5 sthregh in lower extremities.   Medications: Medication Dose Route Frequency  . 0.9 %  sodium chloride infusion  250 mL Intravenous PRN  . acetaminophen (TYLENOL) tablet 650 mg  650 mg Oral Q6H PRN    . acetaminophen (TYLENOL) suppository 650 mg  650 mg Rectal Q6H PRN  . amphetamine-dextroamphetamine (ADDERALL XR) 24 hr capsule 20 mg  20 mg Oral Q breakfast  . aspirin EC tablet 81 mg  81 mg Oral Daily  . buPROPion (WELLBUTRIN XL) 450 mg  450 mg Oral Daily  . DULoxetine (CYMBALTA) DR capsule 90 mg  90 mg Oral Daily  . enoxaparin (LOVENOX) injection 40 mg  40 mg Subcutaneous Q24H  . ferrous gluconate (FERGON) tablet 324 mg  324 mg Oral Q breakfast  . hydrOXYzine (ATARAX/VISTARIL) tablet 25 mg  25 mg Oral QHS  . insulin aspart (novoLOG) injection 0-15 Units  0-15 Units Subcutaneous TID WC  . insulin glargine (LANTUS) injection 5 Units  5 Units Subcutaneous q morning - 10a  . LORazepam (ATIVAN) tablet 0.5 mg  0.5 mg Oral Daily  . LORazepam (ATIVAN) tablet 3 mg  3 mg Oral QHS  . metoprolol succinate  (TOPROL-XL) 24 hr tablet 25 mg  25 mg Oral Daily  . mulitivitamin with minerals tablet 1 tablet  1 tablet Oral Daily  . Oxcarbazepine (TRILEPTAL) tablet 300 mg  300 mg Oral BID  . pantoprazole (PROTONIX) EC tablet 40 mg  40 mg Oral Q1200  . simvastatin (ZOCOR) tablet 20 mg  20 mg Oral q1800  . sodium chloride 0.9 % injection 3 mL  3 mL Intravenous Q12H  . sodium chloride 0.9 % injection 3 mL  3 mL Intravenous PRN  . DISCONTD: insulin glargine (LANTUS) injection 10 Units  10 Units Subcutaneous Daily   Assessment and Plan: 1. Recurrent falls: Pt has multiple neurologic condition as possible cause of fall: He is followed by Dr. Francesca Oman [phone]) neurology. 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 His next appointment with neurology is confirmed to be on 3/13 at 11:45 pm. Pt is medically stable to go to ALF. Will dc cardiac monitoring.  2. DM: will restart home metformin. At discharge pt will restart home regimen.  3. Psych: diagnosis of Bipolar and ADD. Pt on Wellbutrin, Cymbalta, Adderall and Trileptal. . Psychiatry evaluate pt and diagnosed competency. Pt agreeable to go to ALF .  4. HTN: on toprol. BP  stable.   5. Hyperlipidemia: continue on statin   FEN/GI: CHO diet  Prophylaxis: lovenox  Disposition: discharge to ALF  D. Piloto Rolene Arbour, MD PGY1, [facility] Medicine Teaching Service Pager [phone] 07/16/2011

## 2011-07-16 NOTE — Progress Notes (Signed)
Family Medicine Teaching Service Attending Note  I interviewed and examined patient Kyle [name] and reviewed their tests and x-rays.  I discussed with Dr. Aviva Signs and reviewed their note for today.  I agree with their assessment and plan.     Additionally  Medically stable for discharge

## 2011-07-16 NOTE — Progress Notes (Signed)
Clinical Child psychotherapist (CSW) informed that pt ready for dc to [facility]F. CSW informed the facility who stated they unfortunately have not moved pt furniture from his Independent facility to the ALF however pt can dc home with a 24hr sitter. CSW confirmed this information with pt wife Erskine Squibb who has setup the 24hr sitter to ensure pt safety. CSW has provided a dc packet with an FL2 and dc summary as the plan is for pt to move into the ALF over the weekend. CSW informed pt who is agreeable. Pt will be wheeled down to short stay and picked up by his wife at 4:30pm who will transfer pt to the Independent Living. No further needs addressed, CSW is signing off.  Theresia Bough, MSW, Theresia Majors [phone]

## 2011-07-16 NOTE — Progress Notes (Signed)
Inpatient Diabetes Program Recommendations  AACE/ADA: New Consensus Statement on Inpatient Glycemic Control (2009)  Target Ranges:  Prepandial:   less than 140 mg/dL      Peak postprandial:   less than 180 mg/dL (1-2 hours)      Critically ill patients:  140 - 180 mg/dL   Results for ZYMIRE, TURNBO (MRN [id number]) as of 07/16/2011 10:16  Ref. Range 07/15/2011 11:38 07/15/2011 16:35 07/15/2011 21:26 07/16/2011 06:26  Glucose-Capillary Latest Range: 70-99 mg/dL 782 (H) 956 (H) 213 (H) 263 (H)    Inpatient Diabetes Program Recommendations Insulin - Basal: Increase Lantus to at least 1/2 home dose. Home dose is a total of 50 units.

## 2011-07-16 NOTE — Discharge Instructions (Signed)
Take medications as prescribed. There is no changes on your current regimen. Please follow up appointments as scheduled.
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
## 2011-07-19 ENCOUNTER — Other Ambulatory Visit: Payer: Self-pay | Admitting: Internal Medicine

## 2011-07-19 MED ORDER — AMPHETAMINE-DEXTROAMPHET ER 10 MG PO CP24: 20.0000 mg | ORAL_CAPSULE | ORAL | Status: DC

## 2011-07-20 ENCOUNTER — Telehealth: Payer: Self-pay | Admitting: Emergency Medicine

## 2011-07-20 NOTE — Telephone Encounter (Signed)
Question about Enderal Sr dosage.  She said this is an urgent question and she needs someone to call in the next few minutes.

## 2011-07-20 NOTE — Telephone Encounter (Signed)
Pt cargiver is requesting a letter stating the dosage that was given today, he missed his normal dosage due to being out of medication. And pt caregiver needs letter on file. Any questions please contact Angelique or the med tech that is on duty @ [phone]

## 2011-07-20 NOTE — Telephone Encounter (Signed)
At approx 1:30 today  Angelique called [facility]  stating  patient received lnderal 10 mg this AM. She states patient was suppose to receive 20 mg but they did not have this in stock and pharmacy has just sent over . Wants to  know if they can give the other half of prescribed dosage now. Paged Dr. Aviva Signs and she did discharge  patient on the In Patient team, however this med is not on med list. Advised will need to contact PCP. Caregiver notified to contact Dr. Cleta Alberts.

## 2011-07-22 LAB — AMPHETAMINES URINE CONFIRMATION
Amphetamines: 1075 NG/ML — ABNORMAL HIGH
Methylenedioxyamphetamine: NEGATIVE NG/ML
Methylenedioxyethylamphetamine: NEGATIVE NG/ML
Methylenedioxymethamphetamine: NEGATIVE NG/ML

## 2011-07-27 NOTE — Telephone Encounter (Signed)
Has this been addressed?

## 2011-07-27 NOTE — Telephone Encounter (Signed)
This was addressed on 07/20/2011. See previous note.

## 2011-08-04 ENCOUNTER — Other Ambulatory Visit: Payer: Self-pay | Admitting: Emergency Medicine

## 2011-08-04 ENCOUNTER — Other Ambulatory Visit: Payer: Self-pay | Admitting: Physician Assistant

## 2011-08-06 ENCOUNTER — Other Ambulatory Visit: Payer: Self-pay | Admitting: Physician Assistant

## 2011-08-06 MED ORDER — OXCARBAZEPINE 300 MG PO TABS: 300.0000 mg | ORAL_TABLET | Freq: Two times a day (BID) | ORAL | Status: DC

## 2011-08-07 ENCOUNTER — Other Ambulatory Visit: Payer: Self-pay

## 2011-08-07 MED ORDER — CALCIUM CARBONATE-VITAMIN D 600-400 MG-UNIT PO CHEW: 1.0000 | CHEWABLE_TABLET | Freq: Every day | ORAL | Status: DC

## 2011-08-07 MED ORDER — ASPIRIN EC 81 MG PO TBEC: 81.0000 mg | DELAYED_RELEASE_TABLET | Freq: Every day | ORAL | Status: DC

## 2011-08-11 ENCOUNTER — Emergency Department (HOSPITAL_BASED_OUTPATIENT_CLINIC_OR_DEPARTMENT_OTHER)
Admission: EM | Admit: 2011-08-11 | Discharge: 2011-08-11 | Disposition: A | Discharge status: Home / Self Care | Payer: Medicare Other | Attending: Emergency Medicine | Admitting: Emergency Medicine

## 2011-08-11 ENCOUNTER — Encounter (HOSPITAL_BASED_OUTPATIENT_CLINIC_OR_DEPARTMENT_OTHER): Payer: Self-pay | Admitting: *Deleted

## 2011-08-11 DIAGNOSIS — E119 Type 2 diabetes mellitus without complications: Secondary | ICD-10-CM | POA: Insufficient documentation

## 2011-08-11 DIAGNOSIS — W19XXXA Unspecified fall, initial encounter: Secondary | ICD-10-CM

## 2011-08-11 DIAGNOSIS — S0081XA Abrasion of other part of head, initial encounter: Secondary | ICD-10-CM

## 2011-08-11 DIAGNOSIS — F988 Other specified behavioral and emotional disorders with onset usually occurring in childhood and adolescence: Secondary | ICD-10-CM | POA: Insufficient documentation

## 2011-08-11 DIAGNOSIS — F319 Bipolar disorder, unspecified: Secondary | ICD-10-CM | POA: Insufficient documentation

## 2011-08-11 DIAGNOSIS — Z794 Long term (current) use of insulin: Secondary | ICD-10-CM | POA: Insufficient documentation

## 2011-08-11 DIAGNOSIS — S00419A Abrasion of unspecified ear, initial encounter: Secondary | ICD-10-CM

## 2011-08-11 DIAGNOSIS — W010XXA Fall on same level from slipping, tripping and stumbling without subsequent striking against object, initial encounter: Secondary | ICD-10-CM | POA: Insufficient documentation

## 2011-08-11 DIAGNOSIS — IMO0002 Reserved for concepts with insufficient information to code with codable children: Secondary | ICD-10-CM | POA: Insufficient documentation

## 2011-08-11 DIAGNOSIS — Z8679 Personal history of other diseases of the circulatory system: Secondary | ICD-10-CM | POA: Insufficient documentation

## 2011-08-11 DIAGNOSIS — E785 Hyperlipidemia, unspecified: Secondary | ICD-10-CM | POA: Insufficient documentation

## 2011-08-11 DIAGNOSIS — I2510 Atherosclerotic heart disease of native coronary artery without angina pectoris: Secondary | ICD-10-CM | POA: Insufficient documentation

## 2011-08-11 LAB — GLUCOSE, CAPILLARY

## 2011-08-11 MED ORDER — TETANUS-DIPHTHERIA TOXOIDS TD 5-2 LFU IM INJ
0.5000 mL | INJECTION | Freq: Once | INTRAMUSCULAR | Status: AC
  Administered 2011-08-11: 0.5 mL via INTRAMUSCULAR

## 2011-08-11 MED ORDER — TETANUS-DIPHTHERIA TOXOIDS TD 5-2 LFU IM INJ
INJECTION | INTRAMUSCULAR | Status: AC
  Filled 2011-08-11: qty 0.5
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
## 2011-08-11 NOTE — Discharge Instructions (Signed)
Abrasions Abrasions are skin scrapes. Their treatment depends on how large and deep the abrasion is. Abrasions do not extend through all layers of the skin. A cut or lesion through all skin layers is called a laceration. HOME CARE INSTRUCTIONS   If you were given a dressing, change it at least once a day or as instructed by your caregiver. If the bandage sticks, soak it off with a solution of water or hydrogen peroxide.   Twice a day, wash the area with soap and water to remove all the cream/ointment. You may do this in a sink, under a tub faucet, or in a shower. Rinse off the soap and pat dry with a clean towel. Look for signs of infection (see below).   Reapply cream/ointment according to your caregiver's instruction. This will help prevent infection and keep the bandage from sticking. Telfa or gauze over the wound and under the dressing or wrap will also help keep the bandage from sticking.   If the bandage becomes wet, dirty, or develops a foul smell, change it as soon as possible.   Only take over-the-counter or prescription medicines for pain, discomfort, or fever as directed by your caregiver.  SEEK IMMEDIATE MEDICAL CARE IF:   Increasing pain in the wound.   Signs of infection develop: redness, swelling, surrounding area is tender to touch, or pus coming from the wound.   You have a fever.   Any foul smell coming from the wound or dressing.  Most skin wounds heal within ten days. Facial wounds heal faster. However, an infection may occur despite proper treatment. You should have the wound checked for signs of infection within 24 to 48 hours or sooner if problems arise. If you were not given a wound-check appointment, look closely at the wound yourself on the second day for early signs of infection listed above. MAKE SURE YOU:   Understand these instructions.   Will watch your condition.   Will get help right away if you are not doing well or get worse.  Document Released:  02/18/2005 Document Revised: 04/30/2011 Document Reviewed: 04/14/2011 [employer] Patient Information 2012 [employer]. 

## 2011-08-11 NOTE — ED Notes (Signed)
From [facility]. He tripped and fell. Hit his head. Left ear has dried blood noted. Pt thinks he hit it on a wheelchair.

## 2011-08-18 ENCOUNTER — Telehealth: Payer: Self-pay

## 2011-08-18 NOTE — Telephone Encounter (Signed)
Jasmine December would like for Dr Cleta Alberts to refax alll ppwk that he signed pertaining to pt falls, their fax maching is having some issues please refax @ [phone]

## 2011-08-18 NOTE — Telephone Encounter (Signed)
Kyle [name] WOULD LIKE TO SPEAK WITH DR DAUB REGARDING HER HUSBAND. STATES HE IS FALLING A LOT SINCE TRYING TO USE HIS WALKER, THEY WANT TO PUT HIM IN AN ASSISTED LIVING PLACE, BUT SHE ISN'T SURE THAT WOULD BE A SMART MOVE. PLEASE CALL [phone]

## 2011-08-19 ENCOUNTER — Telehealth: Payer: Self-pay

## 2011-08-23 ENCOUNTER — Emergency Department (HOSPITAL_BASED_OUTPATIENT_CLINIC_OR_DEPARTMENT_OTHER)
Admission: EM | Admit: 2011-08-23 | Discharge: 2011-08-24 | Disposition: A | Discharge status: Home / Self Care | Payer: Medicare Other | Attending: Emergency Medicine | Admitting: Emergency Medicine

## 2011-08-23 ENCOUNTER — Emergency Department (INDEPENDENT_AMBULATORY_CARE_PROVIDER_SITE_OTHER): Payer: Medicare Other

## 2011-08-23 ENCOUNTER — Encounter (HOSPITAL_BASED_OUTPATIENT_CLINIC_OR_DEPARTMENT_OTHER): Payer: Self-pay | Admitting: *Deleted

## 2011-08-23 ENCOUNTER — Other Ambulatory Visit: Payer: Self-pay

## 2011-08-23 DIAGNOSIS — F411 Generalized anxiety disorder: Secondary | ICD-10-CM | POA: Insufficient documentation

## 2011-08-23 DIAGNOSIS — S51009A Unspecified open wound of unspecified elbow, initial encounter: Secondary | ICD-10-CM | POA: Insufficient documentation

## 2011-08-23 DIAGNOSIS — W19XXXA Unspecified fall, initial encounter: Secondary | ICD-10-CM

## 2011-08-23 DIAGNOSIS — S0003XA Contusion of scalp, initial encounter: Secondary | ICD-10-CM | POA: Insufficient documentation

## 2011-08-23 DIAGNOSIS — F0390 Unspecified dementia without behavioral disturbance: Secondary | ICD-10-CM | POA: Insufficient documentation

## 2011-08-23 DIAGNOSIS — F988 Other specified behavioral and emotional disorders with onset usually occurring in childhood and adolescence: Secondary | ICD-10-CM | POA: Insufficient documentation

## 2011-08-23 DIAGNOSIS — S0190XA Unspecified open wound of unspecified part of head, initial encounter: Secondary | ICD-10-CM

## 2011-08-23 DIAGNOSIS — Z79899 Other long term (current) drug therapy: Secondary | ICD-10-CM | POA: Insufficient documentation

## 2011-08-23 DIAGNOSIS — S41109A Unspecified open wound of unspecified upper arm, initial encounter: Secondary | ICD-10-CM

## 2011-08-23 DIAGNOSIS — Z7982 Long term (current) use of aspirin: Secondary | ICD-10-CM | POA: Insufficient documentation

## 2011-08-23 DIAGNOSIS — Z8673 Personal history of transient ischemic attack (TIA), and cerebral infarction without residual deficits: Secondary | ICD-10-CM | POA: Insufficient documentation

## 2011-08-23 DIAGNOSIS — S51019A Laceration without foreign body of unspecified elbow, initial encounter: Secondary | ICD-10-CM

## 2011-08-23 DIAGNOSIS — E119 Type 2 diabetes mellitus without complications: Secondary | ICD-10-CM | POA: Insufficient documentation

## 2011-08-23 DIAGNOSIS — F29 Unspecified psychosis not due to a substance or known physiological condition: Secondary | ICD-10-CM

## 2011-08-23 DIAGNOSIS — E782 Mixed hyperlipidemia: Secondary | ICD-10-CM | POA: Insufficient documentation

## 2011-08-23 DIAGNOSIS — S0180XA Unspecified open wound of other part of head, initial encounter: Secondary | ICD-10-CM

## 2011-08-23 DIAGNOSIS — Z794 Long term (current) use of insulin: Secondary | ICD-10-CM | POA: Insufficient documentation

## 2011-08-23 DIAGNOSIS — IMO0002 Reserved for concepts with insufficient information to code with codable children: Secondary | ICD-10-CM | POA: Insufficient documentation

## 2011-08-23 DIAGNOSIS — I2510 Atherosclerotic heart disease of native coronary artery without angina pectoris: Secondary | ICD-10-CM | POA: Insufficient documentation

## 2011-08-23 LAB — PROTIME-INR: INR: 1.07 (ref 0.00–1.49)

## 2011-08-23 LAB — BASIC METABOLIC PANEL
Calcium: 10 mg/dL (ref 8.4–10.5)
GFR calc Af Amer: >90 mL/min (ref >90)
GFR calc non Af Amer: 89 mL/min — ABNORMAL LOW (ref >90)
Potassium: 4.7 mEq/L (ref 3.5–5.1)
Sodium: 140 mEq/L (ref 135–145)

## 2011-08-23 LAB — DIFFERENTIAL
Basophils Absolute: 0 10*3/uL (ref 0.0–0.1)
Basophils Relative: 0 % (ref 0–1)
Lymphocytes Relative: 21 % (ref 12–46)
Neutro Abs: 6.9 10*3/uL (ref 1.7–7.7)
Neutrophils Relative %: 65 % (ref 43–77)

## 2011-08-23 LAB — CBC
HCT: 38.6 % — ABNORMAL LOW (ref 39.0–52.0)
Hemoglobin: 12.9 g/dL — ABNORMAL LOW (ref 13.0–17.0)
MCHC: 33.4 g/dL (ref 30.0–36.0)
RDW: 14.2 % (ref 11.5–15.5)
WBC: 10.5 10*3/uL (ref 4.0–10.5)

## 2011-08-23 MED ORDER — LIDOCAINE HCL 2 % IJ SOLN: 40.0000 mL | Freq: Once | INTRAMUSCULAR | Status: DC

## 2011-08-23 MED ORDER — LIDOCAINE-EPINEPHRINE 2 %-1:100000 IJ SOLN
20.0000 mL | Freq: Once | INTRAMUSCULAR | Status: AC
  Administered 2011-08-23: 20 mL via INTRADERMAL
  Filled 2011-08-23: qty 1

## 2011-08-23 MED ORDER — TETANUS-DIPHTH-ACELL PERTUSSIS 5-2.5-18.5 LF-MCG/0.5 IM SUSP: 0.5000 mL | Freq: Once | INTRAMUSCULAR | Status: DC

## 2011-08-23 MED ORDER — LIDOCAINE HCL 2 % IJ SOLN
INTRAMUSCULAR | Status: AC
  Filled 2011-08-23: qty 2
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
## 2011-08-23 NOTE — ED Notes (Signed)
Per EMS pt fell this PM at assisted living pt with several lacs to head and face from previous falls tonight injury is a lac to right elbow. Pt is confused per baseline and with hx of dementia.

## 2011-08-23 NOTE — Discharge Instructions (Signed)
Laceration Care, Adult °A laceration is a cut that goes through all layers of the skin. The cut goes into the tissue beneath the skin. °HOME CARE °For stitches (sutures) or staples: °· Keep the cut clean and dry.  °· If you have a bandage (dressing), change it at least once a day. Change the bandage if it gets wet or dirty, or as told by your doctor.  °· Wash the cut with soap and water 2 times a day. Rinse the cut with water. Pat it dry with a clean towel.  °· Put a thin layer of medicated cream on the cut as told by your doctor.  °· You may shower after the first 24 hours. Do not soak the cut in water until the stitches are removed.  °· Only take medicines as told by your doctor.  °· Have your stitches or staples removed as told by your doctor.  °For skin adhesive strips: °· Keep the cut clean and dry.  °· Do not get the strips wet. You may take a bath, but be careful to keep the cut dry.  °· If the cut gets wet, pat it dry with a clean towel.  °· The strips will fall off on their own. Do not remove the strips that are still stuck to the cut.  °For wound glue: °· You may shower or take baths. Do not soak or scrub the cut. Do not swim. Avoid heavy sweating until the glue falls off on its own. After a shower or bath, pat the cut dry with a clean towel.  °· Do not put medicine on your cut until the glue falls off.  °· If you have a bandage, do not put tape over the glue.  °· Avoid lots of sunlight or tanning lamps until the glue falls off. Put sunscreen on the cut for the first year to reduce your scar.  °· The glue will fall off on its own. Do not pick at the glue.  °You may need a tetanus shot if: °· You cannot remember when you had your last tetanus shot.  °· You have never had a tetanus shot.  °If you need a tetanus shot and you choose not to have one, you may get tetanus. Sickness from tetanus can be serious. °GET HELP RIGHT AWAY IF:  °· Your pain does not get better with medicine.  °· Your arm, hand, leg, or  foot loses feeling (numbness) or changes color.  °· Your cut is bleeding.  °· Your joint feels weak, or you cannot use your joint.  °· You have painful lumps on your body.  °· Your cut is red, puffy (swollen), or painful.  °· You have a red line on the skin near the cut.  °· You have yellowish-white fluid (pus) coming from the cut.  °· You have a fever.  °· You have a bad smell coming from the cut or bandage.  °· Your cut breaks open before or after stitches are removed.  °· You notice something coming out of the cut, such as wood or glass.  °· You cannot move a finger or toe.  °MAKE SURE YOU:  °· Understand these instructions.  °· Will watch your condition.  °· Will get help right away if you are not doing well or get worse.  °Document Released: 10/28/2007 Document Revised: 04/30/2011 Document Reviewed: 11/04/2010 °Exit[employer]® Patient Information ©2012 [employer]. °

## 2011-08-24 MED ORDER — LORAZEPAM 1 MG PO TABS
ORAL_TABLET | ORAL | Status: AC
  Administered 2011-08-24: 1 mg via ORAL
  Filled 2011-08-24: qty 1

## 2011-08-24 MED ORDER — LORAZEPAM 1 MG PO TABS
1.0000 mg | ORAL_TABLET | Freq: Once | ORAL | Status: AC
  Administered 2011-08-24: 1 mg via ORAL

## 2011-08-28 NOTE — Telephone Encounter (Signed)
Pt would like for Dr Cleta Alberts to contact him. Pt did not know where he was or the number he was calling from but it was on caller id [phone]

## 2011-09-04 ENCOUNTER — Ambulatory Visit (INDEPENDENT_AMBULATORY_CARE_PROVIDER_SITE_OTHER): Payer: Medicare Other | Admitting: Family Medicine

## 2011-09-04 VITALS — BP 156/72 | HR 62 | Temp 98.1°F | Resp 16 | Ht 70.0 in | Wt 184.6 lb

## 2011-09-04 DIAGNOSIS — M25529 Pain in unspecified elbow: Secondary | ICD-10-CM

## 2011-09-04 NOTE — Progress Notes (Signed)
  Subjective:    Patient ID: Kyle [name], male    DOB: 1950-09-17, 61 y.o.   MRN: [id number]  HPI  Larey Seat last Sunday and suffered laceration (R) elbow.  Sutures placed at [facility] and here for SR. No pain or reduction in ROM  Review of Systems     Objective:   Physical Exam  Constitutional: He appears well-developed.  Musculoskeletal:       Right elbow: He exhibits normal range of motion, no swelling and no deformity. Lacerations: healed.  Neurological: He is alert.  Skin: There is erythema.       Healed laceration to (R) elbow        Assessment & Plan:  Laceration (R) elbow  S/P SR Dressing placed Form completed for nursing facility

## 2011-09-15 ENCOUNTER — Telehealth: Payer: Self-pay

## 2011-09-15 DIAGNOSIS — E119 Type 2 diabetes mellitus without complications: Secondary | ICD-10-CM

## 2011-09-15 NOTE — Telephone Encounter (Signed)
Pt wife called to get all of the patients rx updated and filled.

## 2011-09-15 NOTE — Telephone Encounter (Signed)
Please call with current medication list with sig as well.  Taro Hidrogo

## 2011-09-15 NOTE — Telephone Encounter (Signed)
LMOM with Synetta Fail at nursing home to call us back with an updated medication list.

## 2011-09-15 NOTE — Telephone Encounter (Signed)
Spoke with patients assisted living home and she is going to fax [place] a list of his current medications.

## 2011-09-15 NOTE — Telephone Encounter (Signed)
Have patient call the pharmacy.   Kyle [name]

## 2011-09-15 NOTE — Telephone Encounter (Signed)
Fax from nursing home is at nurses station.

## 2011-09-15 NOTE — Telephone Encounter (Signed)
Patients wife said that the pharmacy is different than the one that is listed at the assisted living home.  The assisted living home is [phone] ext [phone].  The pharmacy that he will be using is [facility].  Can we send his rx to [facility]?

## 2011-09-16 ENCOUNTER — Other Ambulatory Visit: Payer: Self-pay | Admitting: Physician Assistant

## 2011-09-16 ENCOUNTER — Telehealth: Payer: Self-pay

## 2011-09-16 MED ORDER — DONEPEZIL HCL 5 MG PO TABS: 5.0000 mg | ORAL_TABLET | Freq: Every day | ORAL | Status: DC

## 2011-09-16 MED ORDER — HYDROXYZINE HCL 25 MG PO TABS: 25.0000 mg | ORAL_TABLET | Freq: Every day | ORAL | Status: DC

## 2011-09-16 MED ORDER — OXCARBAZEPINE 300 MG PO TABS: 600.0000 mg | ORAL_TABLET | Freq: Two times a day (BID) | ORAL | Status: DC

## 2011-09-16 MED ORDER — INSULIN GLARGINE 100 UNIT/ML ~~LOC~~ SOLN: 20.0000 [IU] | Freq: Two times a day (BID) | SUBCUTANEOUS | Status: DC

## 2011-09-16 MED ORDER — DULOXETINE HCL 30 MG PO CPEP: 90.0000 mg | ORAL_CAPSULE | Freq: Every day | ORAL | Status: DC

## 2011-09-16 MED ORDER — METOPROLOL SUCCINATE ER 25 MG PO TB24: 25.0000 mg | ORAL_TABLET | Freq: Every day | ORAL | Status: DC

## 2011-09-16 MED ORDER — HYDROCODONE-ACETAMINOPHEN 5-325 MG PO TABS: 1.0000 | ORAL_TABLET | Freq: Four times a day (QID) | ORAL | Status: DC | PRN

## 2011-09-16 MED ORDER — DEXLANSOPRAZOLE 60 MG PO CPDR: 60.0000 mg | DELAYED_RELEASE_CAPSULE | Freq: Every day | ORAL | Status: DC

## 2011-09-16 MED ORDER — LORAZEPAM 1 MG PO TABS: 0.5000 mg | ORAL_TABLET | Freq: Two times a day (BID) | ORAL | Status: DC

## 2011-09-16 MED ORDER — FERROUS GLUCONATE 325 MG PO TABS: 325.0000 mg | ORAL_TABLET | Freq: Every day | ORAL | Status: DC

## 2011-09-16 MED ORDER — METFORMIN HCL 500 MG PO TABS: 500.0000 mg | ORAL_TABLET | Freq: Two times a day (BID) | ORAL | Status: DC

## 2011-09-16 MED ORDER — ATORVASTATIN CALCIUM 80 MG PO TABS: 80.0000 mg | ORAL_TABLET | Freq: Every day | ORAL | Status: DC

## 2011-09-16 MED ORDER — BUPROPION HCL ER (XL) 150 MG PO TB24: 450.0000 mg | ORAL_TABLET | ORAL | Status: DC

## 2011-09-16 MED ORDER — INSULIN GLARGINE 100 UNIT/ML ~~LOC~~ SOLN
20.0000 [IU] | Freq: Two times a day (BID) | SUBCUTANEOUS | Status: DC
Start: 2011-09-16 — End: 2011-09-16

## 2011-09-16 MED ORDER — AMPHETAMINE-DEXTROAMPHET ER 10 MG PO CP24: 20.0000 mg | ORAL_CAPSULE | ORAL | Status: DC

## 2011-09-16 NOTE — Telephone Encounter (Signed)
[facility] ([facility]) has sold his entity to [facility] on [address]. The scripts we sent over did not transfer to [facility]  Please resend all of Valetta Close scripts to [facility] at [address] [employer] phone [phone]

## 2011-09-16 NOTE — Telephone Encounter (Signed)
Spoke with patient and let him know that all prescriptions were sent to [facility].

## 2011-09-16 NOTE — Telephone Encounter (Signed)
All non controlled meds have been sent in. Controlled meds are at desk

## 2011-09-17 ENCOUNTER — Other Ambulatory Visit: Payer: Self-pay | Admitting: *Deleted

## 2011-09-17 MED ORDER — FERROUS GLUCONATE 325 MG PO TABS: 325.0000 mg | ORAL_TABLET | Freq: Every day | ORAL | Status: DC

## 2011-09-17 MED ORDER — BUPROPION HCL ER (XL) 150 MG PO TB24: 450.0000 mg | ORAL_TABLET | ORAL | Status: DC

## 2011-09-17 MED ORDER — METOPROLOL SUCCINATE ER 25 MG PO TB24: 25.0000 mg | ORAL_TABLET | Freq: Every day | ORAL | Status: DC

## 2011-09-17 MED ORDER — DULOXETINE HCL 30 MG PO CPEP: 90.0000 mg | ORAL_CAPSULE | Freq: Every day | ORAL | Status: DC

## 2011-09-17 MED ORDER — DEXLANSOPRAZOLE 60 MG PO CPDR: 60.0000 mg | DELAYED_RELEASE_CAPSULE | Freq: Every day | ORAL | Status: DC

## 2011-09-17 MED ORDER — DONEPEZIL HCL 5 MG PO TABS: 5.0000 mg | ORAL_TABLET | Freq: Every day | ORAL | Status: DC

## 2011-09-17 MED ORDER — HYDROXYZINE HCL 25 MG PO TABS: 25.0000 mg | ORAL_TABLET | Freq: Every day | ORAL | Status: DC

## 2011-09-17 MED ORDER — OXCARBAZEPINE 300 MG PO TABS: 600.0000 mg | ORAL_TABLET | Freq: Two times a day (BID) | ORAL | Status: DC

## 2011-09-17 MED ORDER — ATORVASTATIN CALCIUM 80 MG PO TABS: 80.0000 mg | ORAL_TABLET | Freq: Every day | ORAL | Status: DC

## 2011-09-17 MED ORDER — METFORMIN HCL 500 MG PO TABS: 500.0000 mg | ORAL_TABLET | Freq: Two times a day (BID) | ORAL | Status: DC

## 2011-09-17 MED ORDER — AMPHETAMINE-DEXTROAMPHET ER 10 MG PO CP24: 20.0000 mg | ORAL_CAPSULE | ORAL | Status: DC

## 2011-09-17 MED ORDER — INSULIN GLARGINE 100 UNIT/ML ~~LOC~~ SOLN: 20.0000 [IU] | Freq: Two times a day (BID) | SUBCUTANEOUS | Status: DC

## 2011-09-17 MED ORDER — INSULIN GLARGINE 100 UNIT/ML ~~LOC~~ SOLN: SUBCUTANEOUS | Status: DC

## 2011-09-17 NOTE — Telephone Encounter (Signed)
Patient would like to change from needles to pins. He is used to the pins.

## 2011-09-17 NOTE — Telephone Encounter (Signed)
Done and sent in.  [name] 

## 2011-09-17 NOTE — Telephone Encounter (Signed)
Can we change this prescription from needles to pens?

## 2011-09-18 NOTE — Telephone Encounter (Signed)
Per the previous phone encounter, this patient's "controlled meds" were printed and put up front for the patient to pick up.  Adderall is schedule II and cannot be faxed.  Please sort out this issue. If the patient picked up the prescription (check the log that it was signed for), then please notify him/family that it needs to be carried to the pharmacy.

## 2011-09-18 NOTE — Telephone Encounter (Signed)
SYLVIA AT [facility] [address] CALLED; RECEIVED FAX RX FOR PT FOR ADDERALL; CANNOT FILL IT BECAUSE IT WAS FAXED

## 2011-09-20 ENCOUNTER — Emergency Department (HOSPITAL_COMMUNITY): Payer: Medicare Other

## 2011-09-20 ENCOUNTER — Emergency Department (HOSPITAL_COMMUNITY)
Admission: EM | Admit: 2011-09-20 | Discharge: 2011-09-20 | Disposition: A | Discharge status: Home / Self Care | Payer: Medicare Other | Attending: Emergency Medicine | Admitting: Emergency Medicine

## 2011-09-20 ENCOUNTER — Encounter (HOSPITAL_COMMUNITY): Payer: Self-pay | Admitting: *Deleted

## 2011-09-20 DIAGNOSIS — E119 Type 2 diabetes mellitus without complications: Secondary | ICD-10-CM | POA: Insufficient documentation

## 2011-09-20 DIAGNOSIS — F319 Bipolar disorder, unspecified: Secondary | ICD-10-CM | POA: Insufficient documentation

## 2011-09-20 DIAGNOSIS — Z79899 Other long term (current) drug therapy: Secondary | ICD-10-CM | POA: Insufficient documentation

## 2011-09-20 DIAGNOSIS — R079 Chest pain, unspecified: Secondary | ICD-10-CM | POA: Insufficient documentation

## 2011-09-20 DIAGNOSIS — F988 Other specified behavioral and emotional disorders with onset usually occurring in childhood and adolescence: Secondary | ICD-10-CM | POA: Insufficient documentation

## 2011-09-20 DIAGNOSIS — I2510 Atherosclerotic heart disease of native coronary artery without angina pectoris: Secondary | ICD-10-CM | POA: Insufficient documentation

## 2011-09-20 DIAGNOSIS — Z8673 Personal history of transient ischemic attack (TIA), and cerebral infarction without residual deficits: Secondary | ICD-10-CM | POA: Insufficient documentation

## 2011-09-20 DIAGNOSIS — R269 Unspecified abnormalities of gait and mobility: Secondary | ICD-10-CM | POA: Insufficient documentation

## 2011-09-20 LAB — COMPREHENSIVE METABOLIC PANEL
AST: 17 U/L (ref 0–37)
Albumin: 3.7 g/dL (ref 3.5–5.2)
BUN: 29 mg/dL — ABNORMAL HIGH (ref 6–23)
Calcium: 9.3 mg/dL (ref 8.4–10.5)
Creatinine, Ser: 0.92 mg/dL (ref 0.50–1.35)
Total Protein: 6 g/dL (ref 6.0–8.3)

## 2011-09-20 LAB — PROTIME-INR
INR: 1.06 (ref 0.00–1.49)
Prothrombin Time: 14 seconds (ref 11.6–15.2)

## 2011-09-20 LAB — D-DIMER, QUANTITATIVE: D-Dimer, Quant: 0.45 ug/mL-FEU (ref 0.00–0.48)

## 2011-09-20 LAB — CBC
MCH: 27.8 pg (ref 26.0–34.0)
MCHC: 33.8 g/dL (ref 30.0–36.0)
MCV: 82.3 fL (ref 78.0–100.0)
Platelets: 197 10*3/uL (ref 150–400)
RBC: 4.35 MIL/uL (ref 4.22–5.81)
RDW: 13.9 % (ref 11.5–15.5)

## 2011-09-20 LAB — POCT I-STAT TROPONIN I

## 2011-09-20 MED ORDER — SODIUM CHLORIDE 0.9 % IV SOLN
1000.0000 mL | INTRAVENOUS | Status: DC
  Administered 2011-09-20: 1000 mL via INTRAVENOUS

## 2011-09-20 NOTE — Discharge Instructions (Signed)
Chest Pain (Nonspecific) It is often hard to give a specific diagnosis for the cause of chest pain. There is always a chance that your pain could be related to something serious, such as a heart attack or a blood clot in the lungs. You need to follow up with your caregiver for further evaluation. CAUSES   Heartburn.   Pneumonia or bronchitis.   Anxiety or stress.   Inflammation around your heart (pericarditis) or lung (pleuritis or pleurisy).   A blood clot in the lung.   A collapsed lung (pneumothorax). It can develop suddenly on its own (spontaneous pneumothorax) or from injury (trauma) to the chest.   Shingles infection (herpes zoster virus).  The chest wall is composed of bones, muscles, and cartilage. Any of these can be the source of the pain.  The bones can be bruised by injury.   The muscles or cartilage can be strained by coughing or overwork.   The cartilage can be affected by inflammation and become sore (costochondritis).  DIAGNOSIS  Lab tests or other studies, such as X-rays, electrocardiography, stress testing, or cardiac imaging, may be needed to find the cause of your pain.  TREATMENT   Treatment depends on what may be causing your chest pain. Treatment may include:   Acid blockers for heartburn.   Anti-inflammatory medicine.   Pain medicine for inflammatory conditions.   Antibiotics if an infection is present.   You may be advised to change lifestyle habits. This includes stopping smoking and avoiding alcohol, caffeine, and chocolate.   You may be advised to keep your head raised (elevated) when sleeping. This reduces the chance of acid going backward from your stomach into your esophagus.   Most of the time, nonspecific chest pain will improve within 2 to 3 days with rest and mild pain medicine.  HOME CARE INSTRUCTIONS   If antibiotics were prescribed, take your antibiotics as directed. Finish them even if you start to feel better.   For the next few  days, avoid physical activities that bring on chest pain. Continue physical activities as directed.   Do not smoke.   Avoid drinking alcohol.   Only take over-the-counter or prescription medicine for pain, discomfort, or fever as directed by your caregiver.   Follow your caregiver's suggestions for further testing if your chest pain does not go away.   Keep any follow-up appointments you made. If you do not go to an appointment, you could develop lasting (chronic) problems with pain. If there is any problem keeping an appointment, you must call to reschedule.  SEEK MEDICAL CARE IF:   You think you are having problems from the medicine you are taking. Read your medicine instructions carefully.   Your chest pain does not go away, even after treatment.   You develop a rash with blisters on your chest.  SEEK IMMEDIATE MEDICAL CARE IF:   You have increased chest pain or pain that spreads to your arm, neck, jaw, back, or abdomen.   You develop shortness of breath, an increasing cough, or you are coughing up blood.   You have severe back or abdominal pain, feel nauseous, or vomit.   You develop severe weakness, fainting, or chills.   You have a fever.  THIS IS AN EMERGENCY. Do not wait to see if the pain will go away. Get medical help at once. Call your local emergency services (911 in [place] Do not drive yourself to the hospital. MAKE SURE YOU:   Understand these instructions.     Will watch your condition.   Will get help right away if you are not doing well or get worse.  Document Released: 02/18/2005 Document Revised: 04/30/2011 Document Reviewed: 12/15/2007 ExitCare Patient Information 2012 [employer]. 

## 2011-09-20 NOTE — Telephone Encounter (Signed)
LMOM at [facility] letting them know to disregard any fax they received for patients Adderall.  Hardcopy was picked up by patients wife, and will be taken to local pharmacy.Marland Kitchen. ? [facility]?

## 2011-09-20 NOTE — ED Notes (Signed)
Patient is AOx4 and comfortable with his discharge instructions.  Patient is being driven back to his home by [facility].

## 2011-09-20 NOTE — ED Notes (Signed)
Per EMS: pt seen by Home health RN on Friday, then today told RN that he was having CP off an on for the past two days. Pt states the pain moves all around, no SOB, N/V associated with the pain. Pt home RN also states that he was having an unsteady gait. Pt has previous history of CVA with right sided deficits. Pt normally ambulates with walker but was unable to use walker today to get to EMS stretcher. Pt unable to tell home health RN when his gait became unsteady. Pt received 324mg  of aspirin with EMS and 1 SL nitro which relieved his pain.
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
## 2011-09-23 ENCOUNTER — Encounter: Payer: Self-pay | Admitting: Emergency Medicine

## 2011-10-05 MED ORDER — GLUCOSE BLOOD VI STRP: ORAL_STRIP | Status: DC

## 2011-10-05 NOTE — Telephone Encounter (Signed)
Test strips for [employer] glucometer ordered.

## 2011-10-05 NOTE — Telephone Encounter (Signed)
PATIENT NEEDS TEST STRIPS FOR BAYER CONTOUR MACHINE. PHARMACY SENT STRIPS FOR THE BAYER CONTOUR NEXT- THEY DID NOT WORK. THE WIFE TRIED TO TELL ME THE NAME OF THE NEW MACHINE, BUT IT WAS VERY CONFUSING. THEY WILL USE THE BAYER CONTOUR MACHINE, WHICH IS FINE WITH THEM.

## 2011-10-05 NOTE — Telephone Encounter (Signed)
PT HAS A NEW MACHINE TO CHECK HIS BLOOD SUGAR, PT'S WIFE STATES THAT TEST STRIPS THAT WE CALLED INTO THE PHARMACY WERE NOT THE CORRECT TEST STRIPS FOR THIS NEW MACHINE (SHE IS UNSURE OF THE NAME OF THE MACHINE AT THIS TIME). SHE WOULD LIKE FOR SOMEONE TO CALL HER REGARDING THIS ISSUE AS SOON AS POSSIBLE BECAUSE PT IS UNABLE TO CHECK HIS BLOOD SUGAR. PHARMACY: [facility] [address]

## 2011-10-05 NOTE — Telephone Encounter (Signed)
Called patient and let them know the test strips were called in.

## 2011-10-06 ENCOUNTER — Ambulatory Visit (INDEPENDENT_AMBULATORY_CARE_PROVIDER_SITE_OTHER): Payer: Medicare Other | Admitting: Emergency Medicine

## 2011-10-06 ENCOUNTER — Encounter: Payer: Self-pay | Admitting: Emergency Medicine

## 2011-10-06 DIAGNOSIS — I2510 Atherosclerotic heart disease of native coronary artery without angina pectoris: Secondary | ICD-10-CM

## 2011-10-06 DIAGNOSIS — I6990 Unspecified sequelae of unspecified cerebrovascular disease: Secondary | ICD-10-CM

## 2011-10-06 DIAGNOSIS — F3189 Other bipolar disorder: Secondary | ICD-10-CM

## 2011-10-06 LAB — GLUCOSE, POCT (MANUAL RESULT ENTRY): POC Glucose: 295

## 2011-10-06 NOTE — Progress Notes (Signed)
  Subjective:    Patient ID: Kyle Kyle, male    DOB: 16-Oct-1950, 61 y.o.   MRN: [id number]  HPI patient enters for followup of his diabetes high blood pressure coronary disease and stroke. Since his last visit here he has changed the facilities he is staying at. He now is at [facility] and has 24 7 nursing care. Since he has been transferred to a different facility he has had no falling episodes. He is receiving therapy 3 times a week. He uses his walker when he gets up.    Review of Systems patient is much happier at his new facility. He denies having chest pain or worsening of his neurological symptoms.     Objective:   Physical Exam  Eyes: Pupils are equal, round, and reactive to light.  Neck: No thyromegaly present.  Cardiovascular: Normal rate and regular rhythm.   Pulmonary/Chest: No respiratory distress. He has no rales.  Neurological:       Patient has a spastic hemapheresis of the right side. He walks with the assistance of a walker. 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     Results for orders placed in visit on 10/06/11  GLUCOSE, POCT (MANUAL RESULT ENTRY)      Component Value Range   POC Glucose 295    POCT GLYCOSYLATED HEMOGLOBIN (HGB A1C)      Component Value Range   Hemoglobin A1C 7.8         Assessment & Plan:  Even though the patient's sugars up he looks great. Not willing to make any changes in his treatment at the present time. He has only been at the new facility for 2 weeks has not had a single fall since he's been there. He is  to record her sugar and when he has his followup appointment in 2 months we'll make adjustments at that time.

## 2011-10-16 ENCOUNTER — Other Ambulatory Visit: Payer: Self-pay

## 2011-10-16 MED ORDER — ASPIRIN EC 81 MG PO TBEC: 81.0000 mg | DELAYED_RELEASE_TABLET | Freq: Every day | ORAL | Status: DC

## 2011-12-08 ENCOUNTER — Encounter: Payer: Self-pay | Admitting: Emergency Medicine

## 2011-12-08 ENCOUNTER — Ambulatory Visit: Payer: Medicare Other

## 2011-12-08 ENCOUNTER — Ambulatory Visit (INDEPENDENT_AMBULATORY_CARE_PROVIDER_SITE_OTHER): Payer: Medicare Other | Admitting: Emergency Medicine

## 2011-12-08 VITALS — BP 131/90 | HR 80 | Temp 98.3°F | Resp 16

## 2011-12-08 DIAGNOSIS — R61 Generalized hyperhidrosis: Secondary | ICD-10-CM

## 2011-12-08 DIAGNOSIS — J40 Bronchitis, not specified as acute or chronic: Secondary | ICD-10-CM

## 2011-12-08 DIAGNOSIS — E119 Type 2 diabetes mellitus without complications: Secondary | ICD-10-CM

## 2011-12-08 DIAGNOSIS — D72829 Elevated white blood cell count, unspecified: Secondary | ICD-10-CM

## 2011-12-08 DIAGNOSIS — I6789 Other cerebrovascular disease: Secondary | ICD-10-CM

## 2011-12-08 DIAGNOSIS — B308 Other viral conjunctivitis: Secondary | ICD-10-CM

## 2011-12-08 DIAGNOSIS — E785 Hyperlipidemia, unspecified: Secondary | ICD-10-CM

## 2011-12-08 DIAGNOSIS — H109 Unspecified conjunctivitis: Secondary | ICD-10-CM

## 2011-12-08 LAB — POCT CBC
Lymph, poc: 2.4 (ref 0.6–3.4)
MCH, POC: 27.6 pg (ref 27–31.2)
MCHC: 31.4 g/dL — AB (ref 31.8–35.4)
MCV: 87.9 fL (ref 80–97)
MID (cbc): 0.9 (ref 0–0.9)
MPV: 8.7 fL (ref 0–99.8)
POC LYMPH PERCENT: 18.7 %L (ref 10–50)
POC MID %: 6.6 %M (ref 0–12)
Platelet Count, POC: 283 10*3/uL (ref 142–424)
RDW, POC: 15.9 %
WBC: 13.1 10*3/uL — AB (ref 4.6–10.2)

## 2011-12-08 LAB — COMPREHENSIVE METABOLIC PANEL
ALT: 31 U/L (ref 0–53)
AST: 19 U/L (ref 0–37)
BUN: 24 mg/dL — ABNORMAL HIGH (ref 6–23)
Creat: 1.13 mg/dL (ref 0.50–1.35)
Total Bilirubin: 0.5 mg/dL (ref 0.3–1.2)

## 2011-12-08 MED ORDER — CEFDINIR 300 MG PO CAPS: 600.0000 mg | ORAL_CAPSULE | Freq: Two times a day (BID) | ORAL | Status: DC

## 2011-12-08 MED ORDER — OFLOXACIN 0.3 % OP SOLN: 2.0000 [drp] | OPHTHALMIC | Status: AC

## 2011-12-08 MED ORDER — CEFDINIR 300 MG PO CAPS: 600.0000 mg | ORAL_CAPSULE | Freq: Every day | ORAL | Status: AC

## 2011-12-08 NOTE — Progress Notes (Signed)
Subjective:    Patient ID: Kyle [name], male    DOB: [birth year], 61 y.o.   MRN: [id number]  HPI patient presents for his routine diabetes checkup. He presented to 102 instead of [facility] and he was instructed to come down the street with his nurse's aide to be seen at 104. On the way down one of the nurses from 104 spotted him on the street and when out with a wheelchair to assist him into the office. Stat sugar was checked and was 164. He was not complaining of anything on arrival inside the office except for a cold he has had for the past few days associated with some drainage from his left eye and a nonproductive cough. He currently receives 24 7 care at the nursing facility. Since that time he is infrequently fallen and overall has been markedly improved. He currently is on 30 of Lantus in the morning and 20 of Lantus at 8 PM   Review of Systems     Objective:   Physical Exam  Constitutional:       Initial exam revealed a diaphoretic male who was breathing somewhat rapidly but did not appear in any distress.  Eyes: Pupils are equal, round, and reactive to light.       There is some crusting around the left eye with purulent drainage.  Neck: No thyromegaly present.  Cardiovascular: Normal rate and regular rhythm.  Exam reveals no gallop and no friction rub.   No murmur heard. Pulmonary/Chest: Effort normal and breath sounds normal.       Initially he was breathing rapidly but he calmed down his respiratory rate returned to 16. His lungs were clear to auscultation  Abdominal: Soft. He exhibits no distension. There is no rebound.   [facility] reading (PRIMARY) by  Dr.Sanai Frick old rib fracture on the left. No pneumonia seen to  Results for orders placed in visit on 12/08/11  GLUCOSE, POCT (MANUAL RESULT ENTRY)      Component Value Range   POC Glucose 164 (*) 70 - 99 mg/dl  POCT CBC      Component Value Range   WBC 13.1 (*) 4.6 - 10.2 K/uL   Lymph, poc 2.4  0.6 - 3.4   POC LYMPH PERCENT 18.7   10 - 50 %L   MID (cbc) 0.9  0 - 0.9   POC MID % 6.6  0 - 12 %M   POC Granulocyte 9.8 (*) 2 - 6.9   Granulocyte percent 74.7  37 - 80 %G   RBC 5.19  4.69 - 6.13 M/uL   Hemoglobin 14.3  14.1 - 18.1 g/dL   HCT, POC 78.4  69.6 - 53.7 %   MCV 87.9  80 - 97 fL   MCH, POC 27.6  27 - 31.2 pg   MCHC 31.4 (*) 31.8 - 35.4 g/dL   RDW, POC 29.5     Platelet Count, POC 283  142 - 424 K/uL   MPV 8.7  0 - 99.8 fL  POCT GLYCOSYLATED HEMOGLOBIN (HGB A1C)      Component Value Range   Hemoglobin A1C 8.6     EKG ST-T changes present in V4 to V6 almost identical to previous EKG in the chart from 08/16/2010      Assessment & Plan:  I suspect the diaphoresis is secondary to being out in the heat and walking down the street and physical exertion he had to go through. His white count is elevated at 13,000 and he  does have a purulent drainage from his left eye  certainly raising the question of a bronchitis type picture. I placed him on Omnicef twice a day and Ocuflox drops for his left eye.

## 2011-12-08 NOTE — Patient Instructions (Addendum)
Be sure to present to the emergency room or return to our clinic if you have any difficulty with increasing shortness of breath or cough

## 2011-12-11 ENCOUNTER — Telehealth: Payer: Self-pay

## 2011-12-11 NOTE — Telephone Encounter (Signed)
Patient of dr Cleta Alberts lives at [facility].  Toy [name] called and she would like to know if Dr Cleta Alberts would write a prescription for a wheelchair for patient.  Toy [name], (ex wife) helps him a lot and comes to Dr visits with patient.   Her cell phone:  [phone]  Patient:  [phone]  Home  [phone]  Cell phone.  Patient is living at [facility].

## 2011-12-12 NOTE — Telephone Encounter (Signed)
Order for wheel chair written and left at TL desk

## 2011-12-12 NOTE — Telephone Encounter (Signed)
Please rx

## 2011-12-13 NOTE — Telephone Encounter (Signed)
[name] notified and rx in pickup drawer.

## 2011-12-18 ENCOUNTER — Encounter: Payer: Self-pay | Admitting: Emergency Medicine

## 2012-01-10 ENCOUNTER — Ambulatory Visit: Payer: Medicare Other

## 2012-01-10 ENCOUNTER — Ambulatory Visit (INDEPENDENT_AMBULATORY_CARE_PROVIDER_SITE_OTHER): Payer: Medicare Other | Admitting: Emergency Medicine

## 2012-01-10 VITALS — BP 151/81 | HR 71 | Temp 98.6°F | Resp 20 | Ht 68.0 in | Wt 193.0 lb

## 2012-01-10 DIAGNOSIS — I6789 Other cerebrovascular disease: Secondary | ICD-10-CM

## 2012-01-10 DIAGNOSIS — E109 Type 1 diabetes mellitus without complications: Secondary | ICD-10-CM

## 2012-01-10 DIAGNOSIS — E119 Type 2 diabetes mellitus without complications: Secondary | ICD-10-CM

## 2012-01-10 DIAGNOSIS — S20219A Contusion of unspecified front wall of thorax, initial encounter: Secondary | ICD-10-CM

## 2012-01-10 DIAGNOSIS — S2239XA Fracture of one rib, unspecified side, initial encounter for closed fracture: Secondary | ICD-10-CM

## 2012-01-10 DIAGNOSIS — R0781 Pleurodynia: Secondary | ICD-10-CM

## 2012-01-10 LAB — GLUCOSE, POCT (MANUAL RESULT ENTRY): POC Glucose: 426 mg/dl — AB (ref 70–99)

## 2012-01-10 MED ORDER — HYDROCODONE-ACETAMINOPHEN 5-325 MG PO TABS: 1.0000 | ORAL_TABLET | Freq: Four times a day (QID) | ORAL | Status: DC | PRN

## 2012-01-10 NOTE — Progress Notes (Signed)
  Subjective:    Patient ID: Kyle [name], male    DOB: [birth year], 61 y.o.   MRN: [id number]  HPI patient was in his usual state of health until yesterday when he got up to the bathroom and take a shower and fell striking the right lateral ribs. He has a history of multiple falls related to his in unstable gait. His p.m. sugars are running 3-400 .    Review of Systems     Objective:   Physical Exam is a large bruise over the right lateral chest breath sounds are symmetrical there is no tenderness in the right upper abdomen. He is somewhat diaphoretic  [facility] reading (PRIMARY) by  Dr. Cleta Alberts THERE are fractures of the right eighth and ninth ribs. No pneumothorax is seen.  Results for orders placed in visit on 01/10/12  GLUCOSE, POCT (MANUAL RESULT ENTRY)      Component Value Range   POC Glucose 426 (*) 70 - 99 mg/dl        Assessment & Plan:  We'll recheck 10 days. He was given a prescription for hydrocodone since this will be locked up and will only be given on schedule . His p.m. sugars are running 3-400. Will increase his p.m. Lantus from 30 units to 40 units every morning keep his followup appointment in 6 weeks.

## 2012-01-10 NOTE — Progress Notes (Signed)
  Subjective:    Patient ID: Kyle [name], male    DOB: 1950-08-29, 61 y.o.   MRN: [id number]  HPI    Review of Systems     Objective:   Physical Exam  [facility] reading (PRIMARY) by  Dr.Natalya Domzalski fractures of the right eighth and ninth ribs with a large gas shadow in the left upper abdomen but no pneumothorax is seen        Assessment & Plan:

## 2012-01-10 NOTE — Patient Instructions (Addendum)
Please return to see me in 10 days. Please call the office and get her scheduled for that day.Rib Fracture Your caregiver has diagnosed you as having a rib fracture (a break). This can occur by a blow to the chest, by a fall against a hard object, or by violent coughing or sneezing. There may be one or many breaks. Rib fractures may heal on their own within 3 to 8 weeks. The longer healing period is usually associated with a continued cough or other aggravating activities. HOME CARE INSTRUCTIONS   Avoid strenuous activity. Be careful during activities and avoid bumping the injured rib. Activities that cause pain pull on the fracture site(s) and are best avoided if possible.   Eat a normal, well-balanced diet. Drink plenty of fluids to avoid constipation.   Take deep breaths several times a day to keep lungs free of infection. Try to cough several times a day, splinting the injured area with a pillow. This will help prevent pneumonia.   Do not wear a rib belt or binder. These restrict breathing which can lead to pneumonia.   Only take over-the-counter or prescription medicines for pain, discomfort, or fever as directed by your caregiver.  SEEK MEDICAL CARE IF:  You develop a continual cough, associated with thick or bloody sputum. SEEK IMMEDIATE MEDICAL CARE IF:   You have a fever.   You have difficulty breathing.   You have nausea (feeling sick to your stomach), vomiting, or abdominal (belly) pain.   You have worsening pain, not controlled with medications.  Document Released: 05/11/2005 Document Revised: 04/30/2011 Document Reviewed: 10/13/2006 [facility] Patient Information 2012 [place], [place].

## 2012-01-12 ENCOUNTER — Telehealth: Payer: Self-pay
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
## 2012-01-12 NOTE — Telephone Encounter (Signed)
Spoke with other care taker, Luna Kitchens, and advised below.  He is apparently waking up during the night in pain.  Will try using 2 tablets prn pain, and see if this helps.

## 2012-01-12 NOTE — Telephone Encounter (Signed)
Can patient take 2 Vicodin 5/325 every 4-6 hours?

## 2012-01-12 NOTE — Telephone Encounter (Signed)
Ok to take 2 tabs every 4-6 hours as needed.

## 2012-01-15 ENCOUNTER — Telehealth: Payer: Self-pay

## 2012-01-19 ENCOUNTER — Other Ambulatory Visit: Payer: Self-pay

## 2012-01-19 ENCOUNTER — Ambulatory Visit: Payer: Medicare Other | Admitting: Emergency Medicine

## 2012-01-19 ENCOUNTER — Telehealth: Payer: Self-pay

## 2012-01-19 DIAGNOSIS — S2239XA Fracture of one rib, unspecified side, initial encounter for closed fracture: Secondary | ICD-10-CM

## 2012-01-19 MED ORDER — HYDROCODONE-ACETAMINOPHEN 5-325 MG PO TABS: 1.0000 | ORAL_TABLET | Freq: Four times a day (QID) | ORAL | Status: DC | PRN

## 2012-01-19 NOTE — Telephone Encounter (Signed)
Kyle Serve CNA who sits with patient during the day called.  The patient is out of pain medicine and they called Friday and also called the pharmacy.  Pt of Dr Cleta Alberts.   He is on hydrocodone 5/325 mg every 4-6 hours.    pharmacy [facility] [address].  Best phone number [phone]

## 2012-01-19 NOTE — Telephone Encounter (Signed)
[name] had printed Rx. Called it into [facility] and notified pt.

## 2012-02-09 ENCOUNTER — Ambulatory Visit (INDEPENDENT_AMBULATORY_CARE_PROVIDER_SITE_OTHER): Payer: Medicare Other | Admitting: Emergency Medicine

## 2012-02-09 ENCOUNTER — Encounter: Payer: Self-pay | Admitting: Emergency Medicine

## 2012-02-09 VITALS — BP 130/84 | HR 68 | Temp 98.2°F | Resp 16 | Wt 183.0 lb

## 2012-02-09 DIAGNOSIS — E1165 Type 2 diabetes mellitus with hyperglycemia: Secondary | ICD-10-CM

## 2012-02-09 DIAGNOSIS — I2510 Atherosclerotic heart disease of native coronary artery without angina pectoris: Secondary | ICD-10-CM

## 2012-02-09 DIAGNOSIS — I6350 Cerebral infarction due to unspecified occlusion or stenosis of unspecified cerebral artery: Secondary | ICD-10-CM

## 2012-02-09 DIAGNOSIS — Z23 Encounter for immunization: Secondary | ICD-10-CM

## 2012-02-09 DIAGNOSIS — E119 Type 2 diabetes mellitus without complications: Secondary | ICD-10-CM

## 2012-02-09 DIAGNOSIS — I639 Cerebral infarction, unspecified: Secondary | ICD-10-CM

## 2012-02-09 NOTE — Patient Instructions (Addendum)
Patient given prescription for a shower stool as well as supports to help him get off the commode. We'll increase his morning Lantus to 50 units and continue his evening Lantus at 20 units.

## 2012-02-09 NOTE — Progress Notes (Signed)
  Subjective:    Patient ID: Kyle [name], male    DOB: 03/12/1951, 61 y.o.   MRN: [id number]  HPI patient here to followup his diabetes which is dependent on Lantus insulin. He also has a gait disorder following a stroke. He has been evaluated by the neurologist and there is no further treatment indicated regarding [name].. He has 24 7 care with nurses. His sugars in the evening her high but patient likes to get cappuccinos at [facility]. He recently had his bipolar medication doubled and since then he has been very somnolent during the day and very sedated at night.    Review of Systems     Objective:   Physical Exam physical exam reveals an elderly male who is alert and cooperative. His gait is extremely ataxic with flexion deformity of the right arm. His chest was clear his cardiac exam is unremarkable his abdomen was soft. His feet were normal. Hemoglobin A1c done just 2 months ago was 8.6        Assessment & Plan:  I have increased his Lantus in the morning to 50 units from 40 units. He tends to run sugars 3-400 around 7 to 8 at night. I did not add a short-acting insulin for his evening meal because I thought it would make his care more confusing. I will check with Dr. Donell Beers. Regarding his current dose of Trileptal which is at 600 mg twice a day.

## 2012-02-10 ENCOUNTER — Telehealth: Payer: Self-pay

## 2012-02-10 NOTE — Telephone Encounter (Signed)
JASON FROM [facility] STATES PT HAD BROUGHT IN A SCRIPT FOR A WHEELCHAIR BUT THEY DON'T HAVE A DIAGNOSIS OR ANYTHING ELSE FOR THEM TO GO BY PLEASE CALL [phone] EXT [phone]

## 2012-02-10 NOTE — Telephone Encounter (Signed)
Physical Exam physical exam reveals an elderly male who is alert and cooperative. His gait is extremely ataxic with flexion deformity of the right arm. Called [place] with [facility] he is advised on voice mail.

## 2012-02-11 ENCOUNTER — Telehealth: Payer: Self-pay

## 2012-02-11 MED ORDER — INSULIN GLARGINE 100 UNIT/ML ~~LOC~~ SOLN: 50.0000 [IU] | Freq: Every day | SUBCUTANEOUS | Status: DC

## 2012-02-11 NOTE — Telephone Encounter (Signed)
PTS INSULIN WAS INCREASED FROM 40 TO 50 MG IN THE MORNING.  THE PHARMACY SAYS THIS WILL REQUIRE A NEW PRESCRIPTION.  USES [facility] [address].  CALL HIS NURSE AT [phone]

## 2012-02-11 NOTE — Telephone Encounter (Signed)
I have sent in a new prescription for his Lantus with a new sig that includes his new dosage of 50 units.

## 2012-02-11 NOTE — Telephone Encounter (Signed)
Nurse was advised

## 2012-02-17 ENCOUNTER — Other Ambulatory Visit: Payer: Self-pay | Admitting: Physician Assistant

## 2012-03-15 ENCOUNTER — Other Ambulatory Visit: Payer: Self-pay | Admitting: Physician Assistant

## 2012-03-20 ENCOUNTER — Emergency Department (HOSPITAL_COMMUNITY): Payer: Medicare Other

## 2012-03-20 ENCOUNTER — Emergency Department (HOSPITAL_COMMUNITY)
Admission: EM | Admit: 2012-03-20 | Discharge: 2012-03-20 | Disposition: A | Discharge status: Home / Self Care | Payer: Medicare Other | Attending: Emergency Medicine | Admitting: Emergency Medicine

## 2012-03-20 ENCOUNTER — Encounter (HOSPITAL_COMMUNITY): Payer: Self-pay | Admitting: Emergency Medicine

## 2012-03-20 ENCOUNTER — Telehealth: Payer: Self-pay

## 2012-03-20 DIAGNOSIS — F411 Generalized anxiety disorder: Secondary | ICD-10-CM | POA: Insufficient documentation

## 2012-03-20 DIAGNOSIS — IMO0002 Reserved for concepts with insufficient information to code with codable children: Secondary | ICD-10-CM

## 2012-03-20 DIAGNOSIS — E119 Type 2 diabetes mellitus without complications: Secondary | ICD-10-CM | POA: Insufficient documentation

## 2012-03-20 DIAGNOSIS — W19XXXA Unspecified fall, initial encounter: Secondary | ICD-10-CM

## 2012-03-20 DIAGNOSIS — Z8719 Personal history of other diseases of the digestive system: Secondary | ICD-10-CM | POA: Insufficient documentation

## 2012-03-20 DIAGNOSIS — Z87828 Personal history of other (healed) physical injury and trauma: Secondary | ICD-10-CM | POA: Insufficient documentation

## 2012-03-20 DIAGNOSIS — Z23 Encounter for immunization: Secondary | ICD-10-CM | POA: Insufficient documentation

## 2012-03-20 DIAGNOSIS — E782 Mixed hyperlipidemia: Secondary | ICD-10-CM | POA: Insufficient documentation

## 2012-03-20 DIAGNOSIS — S0180XA Unspecified open wound of other part of head, initial encounter: Secondary | ICD-10-CM | POA: Insufficient documentation

## 2012-03-20 DIAGNOSIS — Y939 Activity, unspecified: Secondary | ICD-10-CM | POA: Insufficient documentation

## 2012-03-20 DIAGNOSIS — Z8673 Personal history of transient ischemic attack (TIA), and cerebral infarction without residual deficits: Secondary | ICD-10-CM | POA: Insufficient documentation

## 2012-03-20 DIAGNOSIS — Z87891 Personal history of nicotine dependence: Secondary | ICD-10-CM | POA: Insufficient documentation

## 2012-03-20 DIAGNOSIS — F988 Other specified behavioral and emotional disorders with onset usually occurring in childhood and adolescence: Secondary | ICD-10-CM | POA: Insufficient documentation

## 2012-03-20 DIAGNOSIS — F3289 Other specified depressive episodes: Secondary | ICD-10-CM | POA: Insufficient documentation

## 2012-03-20 DIAGNOSIS — J45909 Unspecified asthma, uncomplicated: Secondary | ICD-10-CM | POA: Insufficient documentation

## 2012-03-20 DIAGNOSIS — Z7982 Long term (current) use of aspirin: Secondary | ICD-10-CM | POA: Insufficient documentation

## 2012-03-20 DIAGNOSIS — Y929 Unspecified place or not applicable: Secondary | ICD-10-CM | POA: Insufficient documentation

## 2012-03-20 DIAGNOSIS — I2510 Atherosclerotic heart disease of native coronary artery without angina pectoris: Secondary | ICD-10-CM | POA: Insufficient documentation

## 2012-03-20 DIAGNOSIS — Z794 Long term (current) use of insulin: Secondary | ICD-10-CM | POA: Insufficient documentation

## 2012-03-20 DIAGNOSIS — W1809XA Striking against other object with subsequent fall, initial encounter: Secondary | ICD-10-CM | POA: Insufficient documentation

## 2012-03-20 DIAGNOSIS — F329 Major depressive disorder, single episode, unspecified: Secondary | ICD-10-CM | POA: Insufficient documentation

## 2012-03-20 DIAGNOSIS — Z79899 Other long term (current) drug therapy: Secondary | ICD-10-CM | POA: Insufficient documentation

## 2012-03-20 HISTORY — DX: Major depressive disorder, single episode, unspecified: F32.9

## 2012-03-20 HISTORY — DX: Unspecified asthma, uncomplicated: J45.909

## 2012-03-20 HISTORY — DX: Depression, unspecified: F32.A

## 2012-03-20 MED ORDER — CEPHALEXIN 500 MG PO CAPS: 500.0000 mg | ORAL_CAPSULE | Freq: Four times a day (QID) | ORAL | Status: DC

## 2012-03-20 MED ORDER — TETANUS-DIPHTH-ACELL PERTUSSIS 5-2.5-18.5 LF-MCG/0.5 IM SUSP
0.5000 mL | Freq: Once | INTRAMUSCULAR | Status: AC
  Administered 2012-03-20: 0.5 mL via INTRAMUSCULAR
  Filled 2012-03-20: qty 0.5

## 2012-03-20 MED ORDER — HYDROCODONE-ACETAMINOPHEN 5-325 MG PO TABS
1.0000 | ORAL_TABLET | Freq: Once | ORAL | Status: AC
  Administered 2012-03-20: 1 via ORAL
  Filled 2012-03-20: qty 1
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
## 2012-03-20 NOTE — ED Notes (Signed)
MD at bedside suturing patient.
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
## 2012-03-21 ENCOUNTER — Telehealth: Payer: Self-pay

## 2012-03-21 NOTE — Telephone Encounter (Signed)
pts caregiver called stating pt was seen in ED this weekend due to a fall and received 10+ sutures. States they did not rx the pt a pain medication because they were looking at an old medication list that stated he is currently on hydrocodone and he is not. I spoke with Rhoderick Moody, PA about this and she stated the pt needs to contact the ED again to have pain meds rx'd because that's where the pt received care.  The caregiver understood.   The pts caregiver called back a few minutes later and stated she was told by the ED the pt needed to receive pain meds from his primary physician (Dr Cleta Alberts) and they could not take care of this request.   Please call pt or pts caregiver as listed below.  bf

## 2012-03-21 NOTE — Telephone Encounter (Signed)
I have concerns, please see me. I called patient this morning. Kyle [name]

## 2012-03-21 NOTE — Telephone Encounter (Signed)
I called patient, he fell off a curb. He was seen in ER. If he needs anything, he will call.

## 2012-03-21 NOTE — Telephone Encounter (Signed)
pts aide called stating that the hospital did not give him pain meds when they put stitches in on his head -two layers of stitches and she would like for [place] to call something in  [facility] [address]   Her number is [phone] Or Joseguadalupe's number of [phone]

## 2012-03-21 NOTE — Telephone Encounter (Signed)
Per Dr. Cleta Alberts pt cannot have any strong pain meds due to his current health conditions and him falling so much.  The only thing that he recommends that he can have 1 extra strength Tylenol every 6 hours.

## 2012-03-30 ENCOUNTER — Ambulatory Visit (INDEPENDENT_AMBULATORY_CARE_PROVIDER_SITE_OTHER): Payer: Medicare Other | Admitting: Family Medicine

## 2012-03-30 VITALS — BP 154/92 | HR 69 | Temp 98.0°F | Resp 16

## 2012-03-30 DIAGNOSIS — S0180XA Unspecified open wound of other part of head, initial encounter: Secondary | ICD-10-CM

## 2012-03-30 DIAGNOSIS — Z4802 Encounter for removal of sutures: Secondary | ICD-10-CM

## 2012-03-30 NOTE — Patient Instructions (Addendum)
Suture Removal Your caregiver has removed your sutures today. If skin adhesive strips were applied at the time of suturing, or applied following removal of the sutures today, they will begin to peel off in a couple more days. If skin adhesive strips remain after 14 days, they may be removed. HOME CARE INSTRUCTIONS    Change any bandages (dressings) at least once a day or as directed by your caregiver. If the bandage sticks, soak it off with warm, soapy water.   Wash the area with soap and water to remove all the cream or ointment (if you were instructed to use any) 2 times a day. Rinse off the soap and pat the area dry with a clean towel.   Reapply cream or ointment as directed by your caregiver. This will help prevent infection and keep the bandage from sticking.   Keep the wound area dry and clean. If the bandage becomes wet, dirty, or develops a bad smell, change it as soon as possible.   Only take over-the-counter or prescription medicines for pain, discomfort, or fever as directed by your caregiver.   Use sunscreen when out in the sun. New scars become sunburned easily.   Return to your caregivers office in in 7 days or as directed to have your sutures removed.  You may need a tetanus shot if:  You cannot remember when you had your last tetanus shot.   You have never had a tetanus shot.   The injury broke your skin.  If you got a tetanus shot, your arm may swell, get red, and feel warm to the touch. This is common and not a problem. If you need a tetanus shot and you choose not to have one, there is a rare chance of getting tetanus. Sickness from tetanus can be serious. SEEK IMMEDIATE MEDICAL CARE IF:    There is redness, swelling, or increasing pain in the wound.   Pus is coming from the wound.   An unexplained oral temperature above 102 F (38.9 C) develops.   You notice a bad smell coming from the wound or dressing.   The wound breaks open (edges not staying together)  after sutures have been removed.  Document Released: 02/03/2001 Document Revised: 08/03/2011 Document Reviewed: 04/04/2007 [employer] Patient Information 2013 [employer].    

## 2012-03-30 NOTE — Progress Notes (Signed)
Is a 61 year old man who is accompanied by his assistant and comes in after having lacerated his left forehead on November 27 when he tripped over a parking cement block. He had no loss of consciousness and currently has no headache or other focal signs of weakness. His hearing is normal and his vision is normal.  Objective: Well-healed arking scar over the left eyebrow which is well healed without signs of erythema or bony abnormality  Neurologically patient is moving all 4 extremities equally, his neck is supple, and his cranial nerves are  All 8 sutures are removed  Assessment: Well-healed laceration due for suture removal

## 2012-04-12 ENCOUNTER — Encounter: Payer: Self-pay | Admitting: Emergency Medicine

## 2012-04-12 ENCOUNTER — Ambulatory Visit (INDEPENDENT_AMBULATORY_CARE_PROVIDER_SITE_OTHER): Payer: Medicare Other | Admitting: Emergency Medicine

## 2012-04-12 VITALS — BP 120/86 | HR 66 | Temp 98.6°F | Resp 16 | Wt 198.2 lb

## 2012-04-12 DIAGNOSIS — R7309 Other abnormal glucose: Secondary | ICD-10-CM

## 2012-04-12 DIAGNOSIS — R269 Unspecified abnormalities of gait and mobility: Secondary | ICD-10-CM

## 2012-04-12 DIAGNOSIS — I6789 Other cerebrovascular disease: Secondary | ICD-10-CM

## 2012-04-12 MED ORDER — INSULIN NPH ISOPHANE & REGULAR (70-30) 100 UNIT/ML ~~LOC~~ SUSP: SUBCUTANEOUS | Status: DC

## 2012-04-12 MED ORDER — INSULIN GLARGINE 100 UNIT/ML ~~LOC~~ SOLN: SUBCUTANEOUS | Status: DC

## 2012-04-12 NOTE — Progress Notes (Signed)
  Subjective:    Patient ID: Kyle [name], male    DOB: [birth year], 61 y.o.   MRN: [id number]  HPI patient has had one fall since he was last here. His sugars run anywhere from 26D462 around 8 in the evening. Morning sugars are ranging anywhere from 70-160. According to the caregiver he drinks 2-3 frapacinos around 5 in the evening. According to the caregiver he has been taking 50 units of Lantus in the morning and 20 units at night.    Review of Systems     Objective:   Physical Exam HEENT reveals a well-healing scar over the left thigh. His neck is supple. Chest is clear cardiac regular rate no murmurs examination of the feet is within normal limits.        Assessment & Plan:  I decided to keep him on Lantus 50 units in the morning I have changed his insulin at night to be a 70/30  novolin mix and we will start with 20 units at night. His Lantus at night has been discontinued

## 2012-04-14 ENCOUNTER — Telehealth: Payer: Self-pay

## 2012-04-14 NOTE — Telephone Encounter (Signed)
Done in error.

## 2012-04-15 ENCOUNTER — Telehealth: Payer: Self-pay

## 2012-04-15 MED ORDER — INSULIN NPH ISOPHANE & REGULAR (70-30) 100 UNIT/ML ~~LOC~~ SUSP: SUBCUTANEOUS | Status: DC

## 2012-04-15 NOTE — Telephone Encounter (Signed)
Called pharmacist per Dr Ellis Parents instr's to order needles and syringes for pt's insulin. Pharmacist stated that there is no way that pt/wife can correctly draw up insulin from vial and pt had been getting the Flex pen. Pharmacist asked permission to change to Flex pens for Novolin 70/30 w/same sig. Gave permission for change and called in Rx for pen needles to use w/pen. Checked w/Dr Cleta Alberts who stated change to Flex pens is fine, but nurse is now giving pt his shots.

## 2012-05-10 ENCOUNTER — Ambulatory Visit: Payer: Medicare Other

## 2012-05-10 ENCOUNTER — Ambulatory Visit (INDEPENDENT_AMBULATORY_CARE_PROVIDER_SITE_OTHER): Payer: Medicare Other | Admitting: Family Medicine

## 2012-05-10 VITALS — BP 130/88 | HR 59 | Temp 98.5°F | Resp 16 | Ht 70.0 in | Wt 195.0 lb

## 2012-05-10 DIAGNOSIS — E119 Type 2 diabetes mellitus without complications: Secondary | ICD-10-CM

## 2012-05-10 DIAGNOSIS — M25579 Pain in unspecified ankle and joints of unspecified foot: Secondary | ICD-10-CM

## 2012-05-10 DIAGNOSIS — M25571 Pain in right ankle and joints of right foot: Secondary | ICD-10-CM

## 2012-05-10 DIAGNOSIS — M25531 Pain in right wrist: Secondary | ICD-10-CM

## 2012-05-10 DIAGNOSIS — M25539 Pain in unspecified wrist: Secondary | ICD-10-CM

## 2012-05-10 LAB — GLUCOSE, POCT (MANUAL RESULT ENTRY): POC Glucose: 124 mg/dl — AB (ref 70–99)

## 2012-05-10 MED ORDER — ACETAMINOPHEN 500 MG PO TABS: 1000.0000 mg | ORAL_TABLET | Freq: Three times a day (TID) | ORAL | Status: DC | PRN
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
## 2012-05-10 NOTE — Patient Instructions (Addendum)
No fractures seen on your x-rays today.  Use your wrist splint as needed for comfort.  For pain you may take your tylenol, up to 2 pills every 8 hours.  If you are not better in the next few days please let us know

## 2012-05-13 ENCOUNTER — Telehealth: Payer: Self-pay

## 2012-05-13 ENCOUNTER — Other Ambulatory Visit: Payer: Self-pay | Admitting: Physician Assistant

## 2012-05-13 NOTE — Telephone Encounter (Signed)
Metformin 500 (write for 56 pills), Metrpoilol 25mg  (write for 28 pills).  [facility] on [address] [phone]

## 2012-05-13 NOTE — Telephone Encounter (Signed)
Called and spoke with him- the ankle is a bit sore.  Asked him to be sure to come in for a recheck if not feeling well in the next couple of days.  He is aware that the fracture seen on his ankle is probably old- however if pain persists we will recheck.

## 2012-05-17 ENCOUNTER — Other Ambulatory Visit: Payer: Self-pay | Admitting: Physician Assistant

## 2012-05-19 ENCOUNTER — Ambulatory Visit (INDEPENDENT_AMBULATORY_CARE_PROVIDER_SITE_OTHER): Payer: Medicare Other | Admitting: Emergency Medicine

## 2012-05-19 VITALS — BP 154/84 | HR 75 | Temp 98.4°F | Resp 16

## 2012-05-19 DIAGNOSIS — S93401A Sprain of unspecified ligament of right ankle, initial encounter: Secondary | ICD-10-CM

## 2012-05-19 DIAGNOSIS — S63509A Unspecified sprain of unspecified wrist, initial encounter: Secondary | ICD-10-CM

## 2012-05-19 DIAGNOSIS — I639 Cerebral infarction, unspecified: Secondary | ICD-10-CM

## 2012-05-19 DIAGNOSIS — S93409A Sprain of unspecified ligament of unspecified ankle, initial encounter: Secondary | ICD-10-CM

## 2012-05-19 DIAGNOSIS — M79609 Pain in unspecified limb: Secondary | ICD-10-CM

## 2012-05-19 DIAGNOSIS — S63501A Unspecified sprain of right wrist, initial encounter: Secondary | ICD-10-CM

## 2012-05-19 DIAGNOSIS — I6350 Cerebral infarction due to unspecified occlusion or stenosis of unspecified cerebral artery: Secondary | ICD-10-CM

## 2012-05-19 DIAGNOSIS — E1165 Type 2 diabetes mellitus with hyperglycemia: Secondary | ICD-10-CM

## 2012-05-19 NOTE — Progress Notes (Signed)
  Subjective:    Patient ID: Kyle [name], male    DOB: 11/18/1950, 61 y.o.   MRN: [id number]  HPI  61 year old male her for hospital follow up.  Pt has an old sprain on right wrist, pt did see a specialist for it, specialist did not see any new fractures. Patient has cut down to two frappachinos per night.  His sugars have been running 90 to 100 in the am.  And it is running 400 plus in the evening.  Patient complained of left ankle pain.  Patients caregiver noticed a bruise on his left side. Patient continues to have frappacinos in the afternoon. His sugars have run up into the 400s in the late afternoon after he drinks these. He continues to get up on his own without assistance. He is a large bruise on his left lateral flank area that he wouldn't need check     Review of Systems     Objective:   Physical Exam patient is seated and in no distress. His chest was clear to auscultation and percussion. Cardiac was regular rate without murmur. Examination of the right wrist reveals a 1 x 2 cm raised reddened area in the area of his known scaffo lunate dislocation. His abdomen is soft and nontender. There is a proximally 8 x 10" bruise-like area of left lateral abdominal wall extending towards the back. There is tenderness over the lateral and medial malleolar of the right ankle  Results for orders placed in visit on 05/19/12  GLUCOSE, POCT (MANUAL RESULT ENTRY)      Component Value Range   POC Glucose 242 (*) 70 - 99 mg/dl        Assessment & Plan:  Patient is stable at present. Encouraged him to not add sugar to use drinks he has at night. He is not due for another hemoglobin A1c for 6 weeks. The nurse forgot to bring his glucose readings over the small I was hesitant to make changes because I did not have the information. Recheck 6 weeks. Also gave him a sweedo for his right ankle.

## 2012-06-05 ENCOUNTER — Emergency Department (HOSPITAL_COMMUNITY): Payer: Medicare Other

## 2012-06-05 ENCOUNTER — Emergency Department (HOSPITAL_COMMUNITY)
Admission: EM | Admit: 2012-06-05 | Discharge: 2012-06-06 | Disposition: A | Discharge status: Home / Self Care | Payer: Medicare Other | Attending: Emergency Medicine | Admitting: Emergency Medicine

## 2012-06-05 DIAGNOSIS — IMO0001 Reserved for inherently not codable concepts without codable children: Secondary | ICD-10-CM | POA: Insufficient documentation

## 2012-06-05 DIAGNOSIS — I2510 Atherosclerotic heart disease of native coronary artery without angina pectoris: Secondary | ICD-10-CM | POA: Insufficient documentation

## 2012-06-05 DIAGNOSIS — F319 Bipolar disorder, unspecified: Secondary | ICD-10-CM | POA: Insufficient documentation

## 2012-06-05 DIAGNOSIS — Z8719 Personal history of other diseases of the digestive system: Secondary | ICD-10-CM | POA: Insufficient documentation

## 2012-06-05 DIAGNOSIS — Z8709 Personal history of other diseases of the respiratory system: Secondary | ICD-10-CM | POA: Insufficient documentation

## 2012-06-05 DIAGNOSIS — R531 Weakness: Secondary | ICD-10-CM

## 2012-06-05 DIAGNOSIS — R0989 Other specified symptoms and signs involving the circulatory and respiratory systems: Secondary | ICD-10-CM | POA: Insufficient documentation

## 2012-06-05 DIAGNOSIS — Z79899 Other long term (current) drug therapy: Secondary | ICD-10-CM | POA: Insufficient documentation

## 2012-06-05 DIAGNOSIS — Z8659 Personal history of other mental and behavioral disorders: Secondary | ICD-10-CM | POA: Insufficient documentation

## 2012-06-05 DIAGNOSIS — Z7982 Long term (current) use of aspirin: Secondary | ICD-10-CM | POA: Insufficient documentation

## 2012-06-05 DIAGNOSIS — E782 Mixed hyperlipidemia: Secondary | ICD-10-CM | POA: Insufficient documentation

## 2012-06-05 DIAGNOSIS — R5383 Other fatigue: Secondary | ICD-10-CM | POA: Insufficient documentation

## 2012-06-05 DIAGNOSIS — Z794 Long term (current) use of insulin: Secondary | ICD-10-CM | POA: Insufficient documentation

## 2012-06-05 DIAGNOSIS — R5381 Other malaise: Secondary | ICD-10-CM | POA: Insufficient documentation

## 2012-06-05 DIAGNOSIS — R238 Other skin changes: Secondary | ICD-10-CM | POA: Insufficient documentation

## 2012-06-05 DIAGNOSIS — R059 Cough, unspecified: Secondary | ICD-10-CM | POA: Insufficient documentation

## 2012-06-05 DIAGNOSIS — R0609 Other forms of dyspnea: Secondary | ICD-10-CM | POA: Insufficient documentation

## 2012-06-05 DIAGNOSIS — Z8673 Personal history of transient ischemic attack (TIA), and cerebral infarction without residual deficits: Secondary | ICD-10-CM | POA: Insufficient documentation

## 2012-06-05 DIAGNOSIS — R05 Cough: Secondary | ICD-10-CM | POA: Insufficient documentation

## 2012-06-05 DIAGNOSIS — Z87891 Personal history of nicotine dependence: Secondary | ICD-10-CM | POA: Insufficient documentation

## 2012-06-05 DIAGNOSIS — R350 Frequency of micturition: Secondary | ICD-10-CM | POA: Insufficient documentation

## 2012-06-05 DIAGNOSIS — F988 Other specified behavioral and emotional disorders with onset usually occurring in childhood and adolescence: Secondary | ICD-10-CM | POA: Insufficient documentation

## 2012-06-05 NOTE — ED Notes (Signed)
As per EMS staff sts weakness and chest congestion with low grade fever.VSS

## 2012-06-05 NOTE — ED Notes (Signed)
Bed:WA25<BR> Expected date:<BR> Expected time:<BR> Means of arrival:<BR> Comments:<BR>

## 2012-06-06 LAB — CBC WITH DIFFERENTIAL/PLATELET
Hemoglobin: 12.9 g/dL — ABNORMAL LOW (ref 13.0–17.0)
Lymphocytes Relative: 22 % (ref 12–46)
Lymphs Abs: 2 10*3/uL (ref 0.7–4.0)
MCH: 29.7 pg (ref 26.0–34.0)
Monocytes Relative: 7 % (ref 3–12)
Neutro Abs: 6.5 10*3/uL (ref 1.7–7.7)
Neutrophils Relative %: 69 % (ref 43–77)
RBC: 4.34 MIL/uL (ref 4.22–5.81)
WBC: 9.4 10*3/uL (ref 4.0–10.5)

## 2012-06-06 LAB — URINALYSIS, ROUTINE W REFLEX MICROSCOPIC
Hgb urine dipstick: NEGATIVE
Specific Gravity, Urine: 1.046 — ABNORMAL HIGH (ref 1.005–1.030)
pH: 5.5 (ref 5.0–8.0)

## 2012-06-06 LAB — URINE MICROSCOPIC-ADD ON: Urine-Other: NONE SEEN

## 2012-06-06 LAB — BASIC METABOLIC PANEL
BUN: 17 mg/dL (ref 6–23)
CO2: 26 mEq/L (ref 19–32)
Chloride: 97 mEq/L (ref 96–112)
Glucose, Bld: 229 mg/dL — ABNORMAL HIGH (ref 70–99)
Potassium: 3.6 mEq/L (ref 3.5–5.1)

## 2012-06-06 LAB — POCT I-STAT TROPONIN I: Troponin i, poc: 0 ng/mL (ref 0.00–0.08)
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
## 2012-06-07 ENCOUNTER — Encounter: Payer: Self-pay | Admitting: Emergency Medicine

## 2012-06-07 ENCOUNTER — Ambulatory Visit (INDEPENDENT_AMBULATORY_CARE_PROVIDER_SITE_OTHER): Payer: Medicare Other | Admitting: Emergency Medicine

## 2012-06-07 VITALS — BP 120/90 | HR 92 | Temp 100.1°F | Resp 16 | Wt 193.0 lb

## 2012-06-07 DIAGNOSIS — R5383 Other fatigue: Secondary | ICD-10-CM

## 2012-06-07 DIAGNOSIS — I6990 Unspecified sequelae of unspecified cerebrovascular disease: Secondary | ICD-10-CM

## 2012-06-07 DIAGNOSIS — R509 Fever, unspecified: Secondary | ICD-10-CM

## 2012-06-07 DIAGNOSIS — J111 Influenza due to unidentified influenza virus with other respiratory manifestations: Secondary | ICD-10-CM

## 2012-06-07 LAB — POCT INFLUENZA A/B: Influenza B, POC: NEGATIVE

## 2012-06-07 LAB — GLUCOSE, POCT (MANUAL RESULT ENTRY): POC Glucose: 284 mg/dl — AB (ref 70–99)

## 2012-06-07 MED ORDER — OSELTAMIVIR PHOSPHATE 75 MG PO CAPS: 75.0000 mg | ORAL_CAPSULE | Freq: Two times a day (BID) | ORAL | Status: DC

## 2012-06-07 NOTE — ED Provider Notes (Signed)
Medical screening examination/treatment/procedure(s) were performed by non-physician practitioner and as supervising physician I was immediately available for consultation/collaboration.   Gerhard Munch, MD 06/07/12 [phone]

## 2012-06-07 NOTE — Progress Notes (Signed)
  Subjective:    Patient ID: Kyle [name]., male    DOB: 01-Feb-1951, 62 y.o.   MRN: [id number]  HPI patient enters after being seen in the emergency room 36 hours ago. He and CBC chest x-ray urine troponins. He continues to have fever or productive cough he has had a flu shot. His sugars continue to be out of control. He drinks about 4 sugar drinks  a day.    Review of Systems     Objective:   Physical Exam patient is alert cooperative in no respiratory distress. His neck is supple. His chest was clear. His cardiac exam is unremarkable. Abdomen is soft nontender   Results for orders placed in visit on 06/07/12  POCT INFLUENZA A/B      Component Value Range   Influenza A, POC Positive     Influenza B, POC Negative    GLUCOSE, POCT (MANUAL RESULT ENTRY)      Component Value Range   POC Glucose 284 (*) 70 - 99 mg/dl       Assessment & Plan:  His sugar is 284. His flu test was positive after 1 minute. He is still in the window to start on Tamiflu 75 twice a day. I gave his caregiver a prescription for Tamiflu 75 one a day for 10 days. He is to stop drinking his Depakene dose during the afternoon which tend to raise his sugar. Once he improves we can talk again about restarting physical therapy through personal trainer. He still tends to get up without assistance and without a walker and has frequent falling spells and visits to the emergency room. He is in independent living but has a 24/ 7 CMA with him to help him and make sure he takes his medications appropriately.

## 2012-06-08 ENCOUNTER — Other Ambulatory Visit: Payer: Self-pay | Admitting: *Deleted

## 2012-06-08 MED ORDER — METOPROLOL TARTRATE 25 MG PO TABS: 25.0000 mg | ORAL_TABLET | Freq: Two times a day (BID) | ORAL | Status: DC

## 2012-06-14 ENCOUNTER — Telehealth: Payer: Self-pay

## 2012-06-14 NOTE — Telephone Encounter (Signed)
Called patient, he is feeling better and is not running a fever any longer. I advised him it is okay for him to resume his regular activities now. He agrees.

## 2012-06-14 NOTE — Telephone Encounter (Signed)
LINDA WHO IS A CNA FOR PT WOULD LIKE TO KNOW WHEN CAN HE GO BACK OUT IN PUBLIC. WAS DIAGNOSED WITH THE FLU A WHILE BACK AND IS GETTING A LITTLE ANXIOUS TO GO OUTSIDE. PLEASE CALL [phone]

## 2012-06-24 ENCOUNTER — Telehealth: Payer: Self-pay | Admitting: Radiology

## 2012-06-24 NOTE — Telephone Encounter (Signed)
[phone]. Patient returned Dr Cleta Alberts call he states he is feeling better. Upon further investigation, we were calling to remind him of his appt on 2/4 with Dr Cleta Alberts.

## 2012-06-28 ENCOUNTER — Encounter: Payer: Self-pay | Admitting: Emergency Medicine

## 2012-06-28 ENCOUNTER — Other Ambulatory Visit: Payer: Self-pay | Admitting: Emergency Medicine

## 2012-06-28 ENCOUNTER — Ambulatory Visit (INDEPENDENT_AMBULATORY_CARE_PROVIDER_SITE_OTHER): Payer: Medicare Other | Admitting: Emergency Medicine

## 2012-06-28 VITALS — BP 135/78 | HR 64 | Temp 98.5°F | Resp 16 | Wt 202.0 lb

## 2012-06-28 DIAGNOSIS — R21 Rash and other nonspecific skin eruption: Secondary | ICD-10-CM

## 2012-06-28 DIAGNOSIS — I639 Cerebral infarction, unspecified: Secondary | ICD-10-CM

## 2012-06-28 DIAGNOSIS — E119 Type 2 diabetes mellitus without complications: Secondary | ICD-10-CM

## 2012-06-28 DIAGNOSIS — I6350 Cerebral infarction due to unspecified occlusion or stenosis of unspecified cerebral artery: Secondary | ICD-10-CM

## 2012-06-28 LAB — GLUCOSE, POCT (MANUAL RESULT ENTRY): POC Glucose: 239 mg/dl — AB (ref 70–99)

## 2012-06-28 MED ORDER — CLOTRIMAZOLE-BETAMETHASONE 1-0.05 % EX CREA: TOPICAL_CREAM | Freq: Two times a day (BID) | CUTANEOUS | Status: DC

## 2012-06-28 MED ORDER — INSULIN ASPART PROT & ASPART (70-30 MIX) 100 UNIT/ML ~~LOC~~ SUSP: SUBCUTANEOUS | Status: DC

## 2012-06-28 MED ORDER — INSULIN NPH ISOPHANE & REGULAR (70-30) 100 UNIT/ML ~~LOC~~ SUSP: SUBCUTANEOUS | Status: DC

## 2012-06-28 MED ORDER — INSULIN GLARGINE 100 UNIT/ML ~~LOC~~ SOLN
50.0000 [IU] | Freq: Once | SUBCUTANEOUS | Status: AC
  Administered 2012-06-28: 50 [IU] via SUBCUTANEOUS

## 2012-06-28 NOTE — Progress Notes (Signed)
  Subjective:    Patient ID: Kyle [name]., male    DOB: 16-Oct-1950, 62 y.o.   MRN: [id number]  HPI problem #1 diabetes. She is still running in the 400s in the afternoon it is unclear whether he's been to his insulin every day as instructed . He did not have any insulin this morning and there is no insulin in the refrigerator today. He denies chest pain or shortness of breath. He has decrease in number of falls since he has had 24 7 care. Problem #2 ataxic gait. He continues to get up at night uses walker. Problem #3 stroke syndrome. He still continues to be unsteady with his gait but no worsening of his symptoms to problem #4 itching at of his scrotum. He's tried different medications without improvement. Mostly over-the-counter and D. ointment.   Review of Systems     Objective:   Physical Exam patient is alert and cooperative today he is in no distress sugar checked this morning was over 250 unclear whether he got insulin last night . Chest is clear heart regular rate no murmurs abdomen soft nontender extremity exam reveals decreased pulses in the lower extremities decreased sensation in the feet there are no areas of skin breakdown. Neurologically he is in a wheelchair needs assistance on and off the exam table. Examination the scrotum reveals redness but no skin breakdown to Results for orders placed in visit on 06/28/12  GLUCOSE, POCT (MANUAL RESULT ENTRY)      Component Value Range   POC Glucose 239 (*) 70 - 99 mg/dl        Assessment & Plan:  He is given 50 units of Lantus in the office. They're going to start making notation of him receiving his insulin in the morning and evening. We'll change him to Novolin 70/30 to take 20 units in the morning 20 units at 5 PM and stop his Lantus. Will recheck in one month to see how this is doing

## 2012-06-28 NOTE — Patient Instructions (Signed)
You are to stop your Lantus insulin he will be on Novolin 70/30 mix. He will take 20 units in the morning 20 units at 5 PM. Recheck here in 6 weeks.

## 2012-06-28 NOTE — Addendum Note (Signed)
Addended by: Lesle Chris A on: 06/28/2012 03:39 PM   Modules accepted: Orders

## 2012-06-30 ENCOUNTER — Telehealth: Payer: Self-pay

## 2012-06-30 NOTE — Telephone Encounter (Signed)
TINA FROM [facility] STATES THEY NEED AN UPDATED MEDICINE LIST FOR PT PLEASE FAX TO [phone] AND YOU MAY REACH HER AT [phone] NEED ASAP IF POSSIBLE

## 2012-06-30 NOTE — Telephone Encounter (Signed)
Faxed

## 2012-07-05 ENCOUNTER — Other Ambulatory Visit: Payer: Self-pay | Admitting: Physician Assistant

## 2012-07-05 ENCOUNTER — Telehealth: Payer: Self-pay

## 2012-07-05 NOTE — Telephone Encounter (Signed)
Received a call to check status of new orders for checking pt's BS. Took PPW to Dr Cleta Alberts and faxed completed order back to [facility] w/confirmation. Placed order to be scanned.

## 2012-07-13 ENCOUNTER — Telehealth: Payer: Self-pay

## 2012-07-13 NOTE — Telephone Encounter (Signed)
Patient has not taken this in quite some time. Why is she now calling for this? Left message for Kyle [name] to call back.

## 2012-07-13 NOTE — Telephone Encounter (Signed)
Kyle [name] from [facility] states that pt needs a rx for adderall. Best# [phone]

## 2012-07-15 NOTE — Telephone Encounter (Signed)
Called again. Left message for her to call me back if she needs anything else.

## 2012-07-21 ENCOUNTER — Other Ambulatory Visit: Payer: Self-pay | Admitting: Emergency Medicine

## 2012-08-01 ENCOUNTER — Telehealth: Payer: Self-pay

## 2012-08-01 NOTE — Telephone Encounter (Signed)
Have written letter, please sign it is in my box.

## 2012-08-01 NOTE — Telephone Encounter (Signed)
PT OF DAUB - PT HAS BEEN CALLED TO [facility].  HE NEEDS A NOTE EXPLAINING IN DETAIL HIS MENTAL/PHYSICAL DISABILITIES AS TO WHY HE CANT SERVE.  THIS IS NEEDED ASAP.  PLEASE CALL [name] WHEN THIS IS READY AT [phone]

## 2012-08-04 ENCOUNTER — Other Ambulatory Visit: Payer: Self-pay | Admitting: Physician Assistant

## 2012-08-09 ENCOUNTER — Ambulatory Visit: Payer: Medicare Other | Admitting: Emergency Medicine

## 2012-08-23 ENCOUNTER — Encounter: Payer: Self-pay | Admitting: Emergency Medicine

## 2012-08-30 ENCOUNTER — Other Ambulatory Visit: Payer: Self-pay | Admitting: Physician Assistant

## 2012-09-06 ENCOUNTER — Ambulatory Visit (INDEPENDENT_AMBULATORY_CARE_PROVIDER_SITE_OTHER): Payer: Medicare Other | Admitting: Emergency Medicine

## 2012-09-06 VITALS — BP 129/79 | HR 58 | Temp 97.6°F | Resp 16

## 2012-09-06 DIAGNOSIS — I639 Cerebral infarction, unspecified: Secondary | ICD-10-CM

## 2012-09-06 DIAGNOSIS — E119 Type 2 diabetes mellitus without complications: Secondary | ICD-10-CM

## 2012-09-06 DIAGNOSIS — Z111 Encounter for screening for respiratory tuberculosis: Secondary | ICD-10-CM

## 2012-09-06 DIAGNOSIS — I10 Essential (primary) hypertension: Secondary | ICD-10-CM

## 2012-09-06 DIAGNOSIS — F319 Bipolar disorder, unspecified: Secondary | ICD-10-CM

## 2012-09-06 DIAGNOSIS — I6350 Cerebral infarction due to unspecified occlusion or stenosis of unspecified cerebral artery: Secondary | ICD-10-CM

## 2012-09-06 LAB — POCT GLYCOSYLATED HEMOGLOBIN (HGB A1C): Hemoglobin A1C: 10.5

## 2012-09-06 NOTE — Progress Notes (Signed)
  Tuberculosis Risk Questionnaire  1. Were you born outside the [place] in one of the following parts of the world:    [place], [place], [place], [place] or [place]?  No  2. Have you traveled outside the [place] and lived for more than one month in one of the following parts of the world:  [place], [place], [place], [place] or [place]?  Yes    3. Do you have a compromised immune system such as from any of the following conditions:  HIV/AIDS, organ or bone marrow transplantation, diabetes, immunosuppressive   medicines (e.g. Prednisone, Remicaide), leukemia, lymphoma, cancer of the   head or neck, gastrectomy or jejunal bypass, end-stage renal disease (on   dialysis), or silicosis?  No    4. Have you ever done one of the following:    Used crack cocaine, injected illegal drugs, worked or resided in jail or prison,   worked or resided at a homeless shelter, or worked as a Research scientist (physical sciences) in   direct contact with patients?  No  5. Have you ever been exposed to anyone with infectious tuberculosis?  No   Tuberculosis Symptom Questionnaire  Do you currently have any of the following symptoms?  1. Unexplained cough lasting more than 3 weeks? No  Unexplained fever lasting more than 3 weeks. No   3. Night Sweats (sweating that leaves the bedclothes and sheets wet)   No  4. Shortness of Breath Yes   5. Chest Pain No  6. Unintentional weight loss  No  7. Unexplained fatigue (very tired for no reason) No

## 2012-09-06 NOTE — Progress Notes (Signed)
  Subjective:    Patient ID: Kyle [name]., male    DOB: Dec 13, 1950, 62 y.o.   MRN: [id number]  HPI patient enters for followup of his diabetes. He is on 20 units of 70/twice a day. His sugars still all run in the 300s. He's had no low spells. He has been watching his diet better. He has 24 7 care so he is not been eating inappropriately. He denies chest pain shortness of breath or any specific problems. He is going to be transferred to [facility] assisted living.    Review of Systems     Objective:   Physical Exam patient is alert and cooperative in no S. Neck is supple. Chest is clear. Heart regular rate no murmurs. Abdomen is soft nontender examination of feet revealed no tissue breakdown to  Results for orders placed in visit on 09/06/12  POCT GLYCOSYLATED HEMOGLOBIN (HGB A1C)      Result Value Range   Hemoglobin A1C 10.5          Assessment & Plan:  Increase insulin to 25 units twice a day and FL2  form will be filled out later.

## 2012-09-06 NOTE — Patient Instructions (Addendum)
Increase her NovoLog 7030-25 units twice a day. Return in 48-72 hours for reading of a PPD to

## 2012-09-07 ENCOUNTER — Telehealth: Payer: Self-pay

## 2012-09-07 NOTE — Telephone Encounter (Signed)
PT WOULD LIKE TO SPEAK WITH DR DAUB REGARDING HIS MEDS. HE DIDN'T WANT TO EXPLAIN TO ME AND HE REALIZES DR DAUB COME IN TOMORROW PLEASE CALL [phone]

## 2012-09-07 NOTE — Telephone Encounter (Signed)
Spoke with patient and he stated that another doctor was the one changing his adderall.  He said he got the answer he needed from the other doctor.

## 2012-09-08 ENCOUNTER — Ambulatory Visit: Payer: Medicare Other | Admitting: Family Medicine

## 2012-09-08 DIAGNOSIS — Z111 Encounter for screening for respiratory tuberculosis: Secondary | ICD-10-CM

## 2012-09-08 LAB — TB SKIN TEST
Induration: 0 mm
TB Skin Test: NEGATIVE

## 2012-09-08 NOTE — Progress Notes (Signed)
  Subjective:    Patient ID: Kyle [name]., male    DOB: Aug 21, 1950, 62 y.o.   MRN: [id number]  HPI pt presents for reading of ppd. Neg. Noted in chart    Review of Systems     Objective:   Physical Exam        Assessment & Plan:

## 2012-09-15 ENCOUNTER — Other Ambulatory Visit: Payer: Self-pay | Admitting: Emergency Medicine

## 2012-09-15 ENCOUNTER — Telehealth: Payer: Self-pay

## 2012-09-15 DIAGNOSIS — G4700 Insomnia, unspecified: Secondary | ICD-10-CM

## 2012-09-15 DIAGNOSIS — F411 Generalized anxiety disorder: Secondary | ICD-10-CM

## 2012-09-15 MED ORDER — CLONAZEPAM 1 MG PO TABS: ORAL_TABLET | ORAL | Status: DC

## 2012-09-15 MED ORDER — ESZOPICLONE 1 MG PO TABS: 1.0000 mg | ORAL_TABLET | Freq: Every day | ORAL | Status: DC

## 2012-09-15 NOTE — Telephone Encounter (Signed)
I have printed the prescriptions. Please verify the doses. One prescription says to take Klonopin  1 mg in the morning and 2 mg at night. The Lunesta prescription is for a 1 mg tablet to take one at night.

## 2012-09-15 NOTE — Telephone Encounter (Signed)
Nurse from [facility] called and reported that pt is going to be admitted to their facility on 09/21/12 and they need a hard copy of Rxs for two of pt's controlled subst Rxs: Lunesta 3 mg, 1 tab po Qhs at bedtime, and clonazepam 1 mg, 1 tab BID. She would like for [place] to fax a copy to her at [phone], but then she needs the actual hard copy either mailed to them, or pt's family can p/up. Address is [address] [place], [place], [place] [address]. Dr Cleta Alberts can you print and sign these Rxs w/how ever many RFs you want to give him?

## 2012-09-16 MED ORDER — ESZOPICLONE 3 MG PO TABS: 3.0000 mg | ORAL_TABLET | Freq: Every day | ORAL | Status: DC

## 2012-09-16 MED ORDER — CLONAZEPAM 1 MG PO TABS: ORAL_TABLET | ORAL | Status: DC

## 2012-09-16 NOTE — Telephone Encounter (Signed)
Thank you for your help get those to me and I will sign them

## 2012-09-16 NOTE — Telephone Encounter (Signed)
Called and spoke w/pharmacist at [facility] who verified that the strengths/doses that the nurse from [facility] reported are correct: Lunesta 3 mg (not 1 mg) 1 tab at bedtime, and clonazepam 1 mg BID. Dr Cleta Alberts, I have rewritten these Rxs and pended them w/5 RFs each for your signature if you approve.

## 2012-09-16 NOTE — Telephone Encounter (Signed)
Printed in box for signature

## 2012-09-19 NOTE — Telephone Encounter (Signed)
RN at [facility] checking on status of Rxs. I advised her they have been printed and are waiting for signature. Dr Cleta Alberts, Rxs are in box at TL desk waiting for your signature. After you sign we need to fax to [phone] and then mail to address at beginning of this phone message.

## 2012-09-20 NOTE — Telephone Encounter (Signed)
Dr Cleta Alberts signed Rxs and I faxed them and also mailed original to [facility]. Spoke w/Crystal the nurse who verified receipt.

## 2012-09-27 ENCOUNTER — Telehealth: Payer: Self-pay

## 2012-09-27 DIAGNOSIS — E119 Type 2 diabetes mellitus without complications: Secondary | ICD-10-CM

## 2012-09-27 DIAGNOSIS — R269 Unspecified abnormalities of gait and mobility: Secondary | ICD-10-CM

## 2012-09-27 NOTE — Telephone Encounter (Signed)
Printed

## 2012-09-27 NOTE — Telephone Encounter (Signed)
Pended orders, sign if you agree

## 2012-09-27 NOTE — Telephone Encounter (Signed)
[name] STATES HER DAD NEED AN ORDER FOR PT AND OT. THEY THINK IT WAS FORGOTTEN THAT DAY. ALSO THEY NEED AN ORDER FOR PT TO HAVE HIS SUGAR TESTED TWICE A DAY, OTHERWISE THEY WON'T DO IT IN THE ASSISTED LIVING . PLEASE CALL [phone]

## 2012-09-27 NOTE — Telephone Encounter (Signed)
It is fine to put this as an order to check [name]'s sugars twice a day.

## 2012-09-29 NOTE — Telephone Encounter (Signed)
Have faxed plan of care for PT and his order for bid glucose checks to [facility].

## 2012-10-06 ENCOUNTER — Telehealth: Payer: Self-pay

## 2012-10-06 NOTE — Telephone Encounter (Signed)
[name] from [facility] is following up on a request for Kyle [name]. [phone]

## 2012-10-06 NOTE — Telephone Encounter (Signed)
Spoke to [place]- She will refax it because it was not done (since it was faxed on  9th of May). Gave her the fax number downstairs so we can put it in Dr.Daub's box.

## 2012-10-10 NOTE — Telephone Encounter (Signed)
Form completed again and faxed to [facility] w/confirmation. Flagged for scanning.

## 2012-10-18 ENCOUNTER — Encounter: Payer: Self-pay | Admitting: Emergency Medicine

## 2012-10-18 ENCOUNTER — Ambulatory Visit (INDEPENDENT_AMBULATORY_CARE_PROVIDER_SITE_OTHER): Payer: Medicare Other | Admitting: Emergency Medicine

## 2012-10-18 ENCOUNTER — Telehealth: Payer: Self-pay

## 2012-10-18 VITALS — BP 146/90 | HR 56 | Temp 98.9°F | Resp 16 | Wt 182.2 lb

## 2012-10-18 DIAGNOSIS — K219 Gastro-esophageal reflux disease without esophagitis: Secondary | ICD-10-CM

## 2012-10-18 DIAGNOSIS — E785 Hyperlipidemia, unspecified: Secondary | ICD-10-CM

## 2012-10-18 DIAGNOSIS — I6350 Cerebral infarction due to unspecified occlusion or stenosis of unspecified cerebral artery: Secondary | ICD-10-CM

## 2012-10-18 DIAGNOSIS — E119 Type 2 diabetes mellitus without complications: Secondary | ICD-10-CM

## 2012-10-18 LAB — POCT GLYCOSYLATED HEMOGLOBIN (HGB A1C): Hemoglobin A1C: 8.9

## 2012-10-18 MED ORDER — INSULIN ASPART PROT & ASPART (70-30 MIX) 100 UNIT/ML ~~LOC~~ SUSP: 30.0000 [IU] | Freq: Two times a day (BID) | SUBCUTANEOUS | Status: DC

## 2012-10-18 MED ORDER — RANITIDINE HCL 150 MG PO TABS: 150.0000 mg | ORAL_TABLET | Freq: Two times a day (BID) | ORAL | Status: DC

## 2012-10-18 NOTE — Telephone Encounter (Signed)
Kyle [name] garden received conflicting instructions on the insulin.  Please clarify by fax order to [phone]

## 2012-10-18 NOTE — Telephone Encounter (Signed)
Called and talked with med tech at [facility]. He is to be on Novolin 70/30 to take 30 units in the morning and 30 units at 5 PM before the nurse leaves

## 2012-10-18 NOTE — Progress Notes (Signed)
  Subjective:    Patient ID: Kyle [name]., male    DOB: 1950/12/14, 62 y.o.   MRN: [id number]  HPI patient is currently in the assisted-living area at [facility]. He had one fall last week but overall is doing well. He is on his insulin twice a day reading still run between 2 and 300. He denies chest pain or shortness of breath or other new symptoms. His only complaint today is increasing heartburn. His GI doctors Dr. Adriana Simas in [place]    Review of Systems     Objective:   Physical Exam HEENT exam is unremarkable. Chest clear heart regular rate no murmurs abdomen soft non-tender  Results for orders placed in visit on 10/18/12  GLUCOSE, POCT (MANUAL RESULT ENTRY)      Result Value Range   POC Glucose 331 (*) 70 - 99 mg/dl  POCT GLYCOSYLATED HEMOGLOBIN (HGB A1C)      Result Value Range   Hemoglobin A1C 8.9          Assessment & Plan:  Hemoglobin A1c is better. He has lost weight at the new facility. We'll increase his NovoLog 70/30 to take 30 units twice a day. Have added Zantac 150 twice a day. He will followup with Dr. Adriana Simas. He will see me in 2 months to

## 2012-10-18 NOTE — Telephone Encounter (Signed)
Please see insulin dosing, this needs correction Amy

## 2012-10-19 ENCOUNTER — Encounter: Payer: Self-pay | Admitting: Family Medicine

## 2012-10-20 ENCOUNTER — Other Ambulatory Visit: Payer: Self-pay | Admitting: Radiology

## 2012-10-20 MED ORDER — HYDROXYZINE HCL 25 MG PO TABS: 25.0000 mg | ORAL_TABLET | Freq: Every day | ORAL | Status: DC

## 2012-10-20 MED ORDER — DONEPEZIL HCL 10 MG PO TABS: 10.0000 mg | ORAL_TABLET | Freq: Every day | ORAL | Status: AC

## 2012-10-20 NOTE — Telephone Encounter (Signed)
Have faxed to [facility] [phone]

## 2012-10-21 ENCOUNTER — Telehealth: Payer: Self-pay

## 2012-10-21 MED ORDER — ATORVASTATIN CALCIUM 80 MG PO TABS: 80.0000 mg | ORAL_TABLET | Freq: Every day | ORAL | Status: DC

## 2012-10-21 MED ORDER — METFORMIN HCL 500 MG PO TABS: 500.0000 mg | ORAL_TABLET | Freq: Two times a day (BID) | ORAL | Status: DC

## 2012-10-21 MED ORDER — METOPROLOL TARTRATE 25 MG PO TABS: ORAL_TABLET | ORAL | Status: DC

## 2012-10-21 NOTE — Telephone Encounter (Signed)
[facility] sends a request for RFs of pt's Metformin 500 BID, metoprolol 25 mg BID and atorvastatin 80 mg QD. They need to be faxed to [phone].  Printed and faxed.

## 2012-10-25 ENCOUNTER — Telehealth: Payer: Self-pay

## 2012-10-25 MED ORDER — DEXLANSOPRAZOLE 60 MG PO CPDR: 60.0000 mg | DELAYED_RELEASE_CAPSULE | Freq: Every day | ORAL | Status: DC

## 2012-10-25 NOTE — Telephone Encounter (Signed)
[facility] faxed request for RFs of Dexilant 60 mg. Printed and faxed RFs.

## 2012-10-27 ENCOUNTER — Other Ambulatory Visit: Payer: Self-pay | Admitting: Radiology

## 2012-10-27 MED ORDER — OXCARBAZEPINE 300 MG PO TABS: ORAL_TABLET | ORAL | Status: DC

## 2012-10-27 NOTE — Telephone Encounter (Signed)
Please advise on the request for Oxcarbazepine 300 mg pended

## 2012-10-27 NOTE — Telephone Encounter (Signed)
Printed rx.  Follow up with Dr. Cleta Alberts as planned in July 2014

## 2012-10-31 ENCOUNTER — Encounter (HOSPITAL_BASED_OUTPATIENT_CLINIC_OR_DEPARTMENT_OTHER): Payer: Self-pay | Admitting: *Deleted

## 2012-10-31 ENCOUNTER — Emergency Department (HOSPITAL_BASED_OUTPATIENT_CLINIC_OR_DEPARTMENT_OTHER): Payer: Medicare Other

## 2012-10-31 ENCOUNTER — Emergency Department (HOSPITAL_BASED_OUTPATIENT_CLINIC_OR_DEPARTMENT_OTHER)
Admission: EM | Admit: 2012-10-31 | Discharge: 2012-10-31 | Disposition: A | Discharge status: Skilled Nursing Facility | Payer: Medicare Other | Attending: Emergency Medicine | Admitting: Emergency Medicine

## 2012-10-31 DIAGNOSIS — J9801 Acute bronchospasm: Secondary | ICD-10-CM

## 2012-10-31 DIAGNOSIS — E119 Type 2 diabetes mellitus without complications: Secondary | ICD-10-CM | POA: Insufficient documentation

## 2012-10-31 DIAGNOSIS — J45901 Unspecified asthma with (acute) exacerbation: Secondary | ICD-10-CM | POA: Insufficient documentation

## 2012-10-31 DIAGNOSIS — R4182 Altered mental status, unspecified: Secondary | ICD-10-CM | POA: Insufficient documentation

## 2012-10-31 DIAGNOSIS — R269 Unspecified abnormalities of gait and mobility: Secondary | ICD-10-CM | POA: Insufficient documentation

## 2012-10-31 DIAGNOSIS — R059 Cough, unspecified: Secondary | ICD-10-CM | POA: Insufficient documentation

## 2012-10-31 DIAGNOSIS — Z87891 Personal history of nicotine dependence: Secondary | ICD-10-CM | POA: Insufficient documentation

## 2012-10-31 DIAGNOSIS — F988 Other specified behavioral and emotional disorders with onset usually occurring in childhood and adolescence: Secondary | ICD-10-CM | POA: Insufficient documentation

## 2012-10-31 DIAGNOSIS — Z79899 Other long term (current) drug therapy: Secondary | ICD-10-CM | POA: Insufficient documentation

## 2012-10-31 DIAGNOSIS — Z7982 Long term (current) use of aspirin: Secondary | ICD-10-CM | POA: Insufficient documentation

## 2012-10-31 DIAGNOSIS — Z87828 Personal history of other (healed) physical injury and trauma: Secondary | ICD-10-CM | POA: Insufficient documentation

## 2012-10-31 DIAGNOSIS — G911 Obstructive hydrocephalus: Secondary | ICD-10-CM | POA: Insufficient documentation

## 2012-10-31 DIAGNOSIS — F0390 Unspecified dementia without behavioral disturbance: Secondary | ICD-10-CM | POA: Insufficient documentation

## 2012-10-31 DIAGNOSIS — Z794 Long term (current) use of insulin: Secondary | ICD-10-CM | POA: Insufficient documentation

## 2012-10-31 DIAGNOSIS — Z9181 History of falling: Secondary | ICD-10-CM | POA: Insufficient documentation

## 2012-10-31 DIAGNOSIS — R05 Cough: Secondary | ICD-10-CM | POA: Insufficient documentation

## 2012-10-31 DIAGNOSIS — J690 Pneumonitis due to inhalation of food and vomit: Secondary | ICD-10-CM

## 2012-10-31 DIAGNOSIS — Z8719 Personal history of other diseases of the digestive system: Secondary | ICD-10-CM | POA: Insufficient documentation

## 2012-10-31 DIAGNOSIS — R0989 Other specified symptoms and signs involving the circulatory and respiratory systems: Secondary | ICD-10-CM | POA: Insufficient documentation

## 2012-10-31 DIAGNOSIS — Z8673 Personal history of transient ischemic attack (TIA), and cerebral infarction without residual deficits: Secondary | ICD-10-CM | POA: Insufficient documentation

## 2012-10-31 DIAGNOSIS — R5381 Other malaise: Secondary | ICD-10-CM | POA: Insufficient documentation

## 2012-10-31 DIAGNOSIS — I2510 Atherosclerotic heart disease of native coronary artery without angina pectoris: Secondary | ICD-10-CM | POA: Insufficient documentation

## 2012-10-31 DIAGNOSIS — E782 Mixed hyperlipidemia: Secondary | ICD-10-CM | POA: Insufficient documentation

## 2012-10-31 DIAGNOSIS — F319 Bipolar disorder, unspecified: Secondary | ICD-10-CM | POA: Insufficient documentation

## 2012-10-31 DIAGNOSIS — R413 Other amnesia: Secondary | ICD-10-CM | POA: Insufficient documentation

## 2012-10-31 DIAGNOSIS — F411 Generalized anxiety disorder: Secondary | ICD-10-CM | POA: Insufficient documentation

## 2012-10-31 MED ORDER — ALBUTEROL SULFATE HFA 108 (90 BASE) MCG/ACT IN AERS: 2.0000 | INHALATION_SPRAY | RESPIRATORY_TRACT | Status: DC | PRN

## 2012-10-31 MED ORDER — IPRATROPIUM BROMIDE 0.02 % IN SOLN
0.5000 mg | Freq: Once | RESPIRATORY_TRACT | Status: AC
  Administered 2012-10-31: 0.5 mg via RESPIRATORY_TRACT
  Filled 2012-10-31: qty 2.5

## 2012-10-31 MED ORDER — PREDNISONE 50 MG PO TABS
60.0000 mg | ORAL_TABLET | Freq: Once | ORAL | Status: AC
  Administered 2012-10-31: 60 mg via ORAL
  Filled 2012-10-31: qty 1

## 2012-10-31 MED ORDER — PREDNISONE 20 MG PO TABS: ORAL_TABLET | ORAL | Status: DC

## 2012-10-31 MED ORDER — ALBUTEROL SULFATE (5 MG/ML) 0.5% IN NEBU
5.0000 mg | INHALATION_SOLUTION | Freq: Once | RESPIRATORY_TRACT | Status: AC
  Administered 2012-10-31: 5 mg via RESPIRATORY_TRACT
  Filled 2012-10-31: qty 1

## 2012-10-31 MED ORDER — AMOXICILLIN-POT CLAVULANATE 875-125 MG PO TABS: 1.0000 | ORAL_TABLET | Freq: Two times a day (BID) | ORAL | Status: DC

## 2012-10-31 NOTE — ED Notes (Signed)
Patient transported to X-ray via stretcher 

## 2012-10-31 NOTE — ED Notes (Signed)
Staff from [facility] [employer] notified of patient's return to facility via Ex-Wife, Jerzy Roepke. Staff member, Sam informed of care and prescription medications sent back with patient.
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
## 2012-10-31 NOTE — ED Notes (Signed)
Pt to room 5 by ems, resp easy and reg, pt is awake and alert to basline. Per ems pt was eating dinner at assisted living facility when he choked on a piece of meat, per staff. Per report pt is more lethargic and sleepy than usual. Pt does not recall why he is here, denies any pain or other c/o.

## 2012-10-31 NOTE — ED Notes (Signed)
MD at bedside. 

## 2012-10-31 NOTE — ED Notes (Signed)
Patient's peripheral IV previously placed PTA by [facility] removed by this RN from Rt forearm. Area clean, dry, intact & catheter intact upon removal.

## 2012-11-03 ENCOUNTER — Ambulatory Visit: Payer: Medicare Other

## 2012-11-03 ENCOUNTER — Ambulatory Visit (INDEPENDENT_AMBULATORY_CARE_PROVIDER_SITE_OTHER): Payer: Medicare Other | Admitting: Emergency Medicine

## 2012-11-03 VITALS — BP 138/87 | HR 59 | Temp 98.2°F | Resp 17

## 2012-11-03 DIAGNOSIS — E1110 Type 2 diabetes mellitus with ketoacidosis without coma: Secondary | ICD-10-CM

## 2012-11-03 DIAGNOSIS — E1310 Other specified diabetes mellitus with ketoacidosis without coma: Secondary | ICD-10-CM

## 2012-11-03 DIAGNOSIS — J690 Pneumonitis due to inhalation of food and vomit: Secondary | ICD-10-CM

## 2012-11-03 NOTE — Patient Instructions (Addendum)
Follow up for a repeat chest xray in 3-4 weeks; recheck pneumonia.

## 2012-11-03 NOTE — Progress Notes (Signed)
  Subjective:    Patient ID: Kyle [name]., male    DOB: Oct 25, 1950, 62 y.o.   MRN: [id number]  HPI 62 year old male here after 10/31/12 ER visit. Choked on food: pork, felt fatigued, congestion, ER diagnosed him with pneumonia.  Patient taking Augmentin, prednisone, and albuterol Cough continues   Review of Systems     Objective:   Physical Exam patient is alert and cooperative. There is a hematoma the right frontal area and abrasion over the bridge of the nose. His chest is clear. Cardiac reveals a regular rate without murmurs. He is sitting in a wheelchair.  [facility] reading (PRIMARY) by  Dr. Cleta Alberts there is a faint infiltrate in the right base. It appears less prominent on this film than the previous chest x-ray 3 days ago         Assessment & Plan:  Patient doing well on current treatment program. He will need a followup film in 3-4 weeks to document clearing of the right lower lobe infiltrate. I do feel he should continue physical therapy even though he is primarily wheelchair restricted at present.

## 2012-11-06 IMAGING — CT CT HEAD W/O CM
1 series · 16 of 30 positions shown, 20 images · non-contrast
Comparison: 06/22/2011 and earlier.

CLINICAL DATA: 60-year-old male with falls.  Altered mental status.

CT HEAD WITHOUT CONTRAST
TECHNIQUE: Contiguous axial images were obtained from the base of
the skull through the vertex without contrast.

[Series 2: head routine 4.8 h37s · axial · 0.47mm/px · z∈[+1324,+1479]mm · 16 of 36 slices shown, 20 images]
[im 2/36  brain]
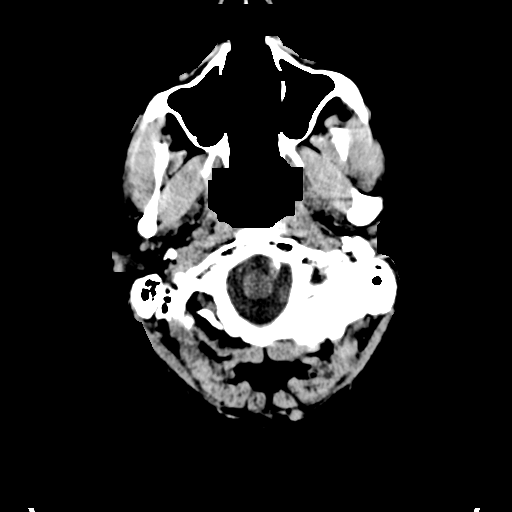
[im 2/36  bone]
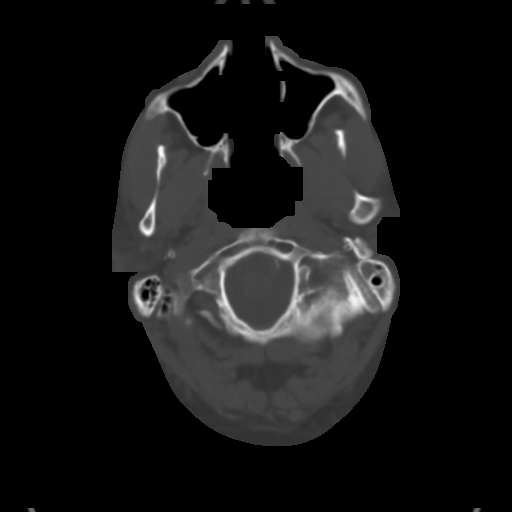
[im 4/36  brain]
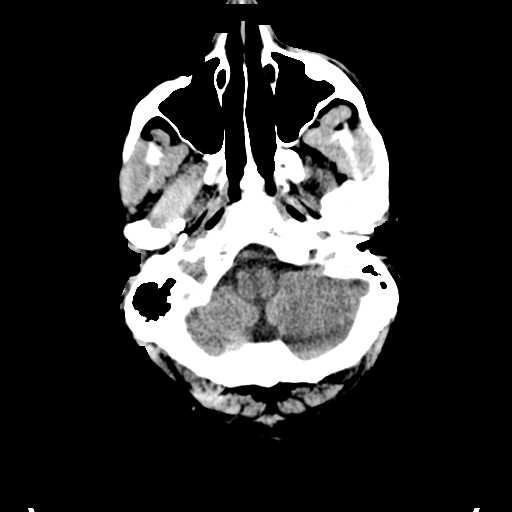
[im 7/36  brain]
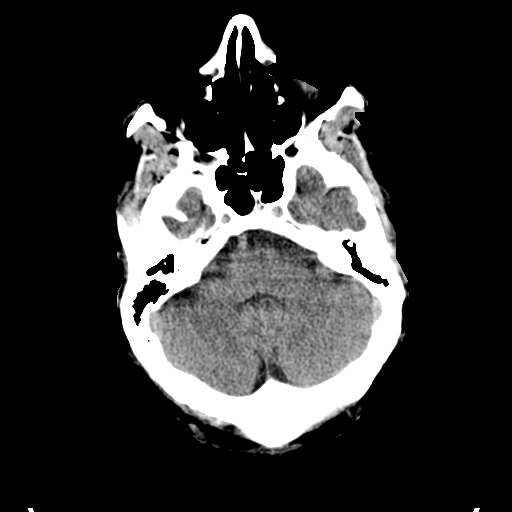
[im 9/36  brain]
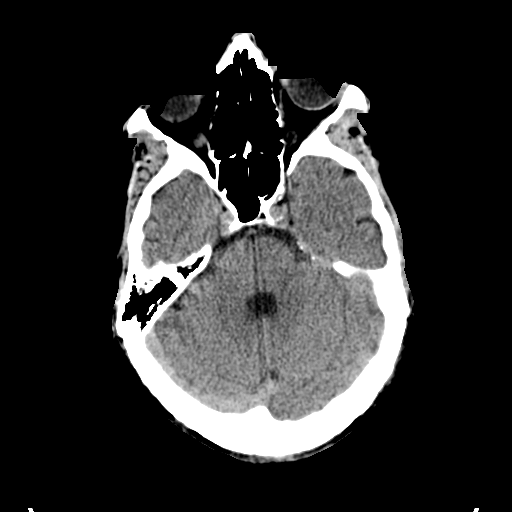
[im 10/36  brain]
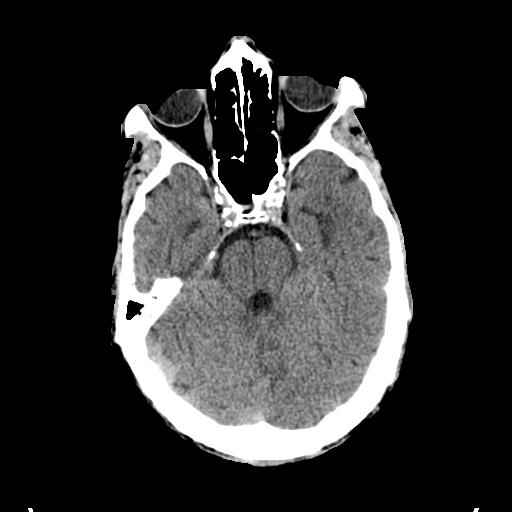
[im 10/36  bone]
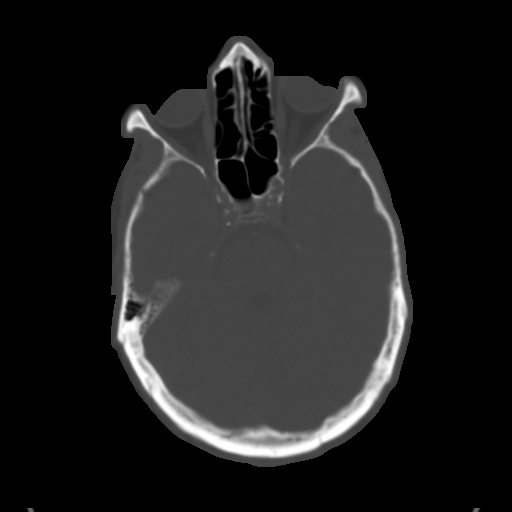
[im 13/36  brain]
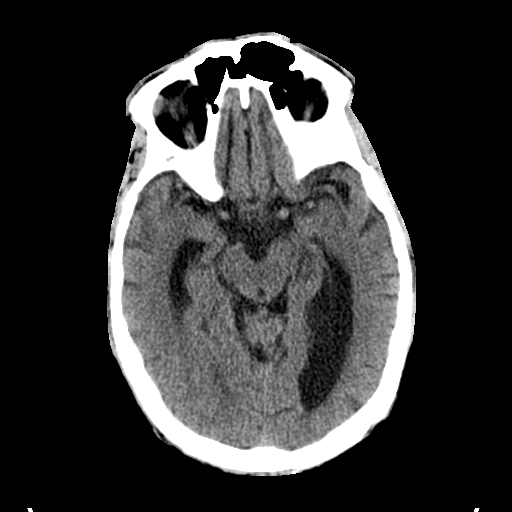
[im 15/36  brain]
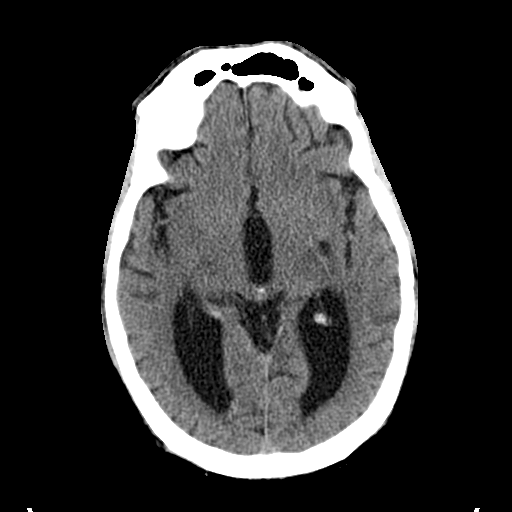
[im 17/36  brain]
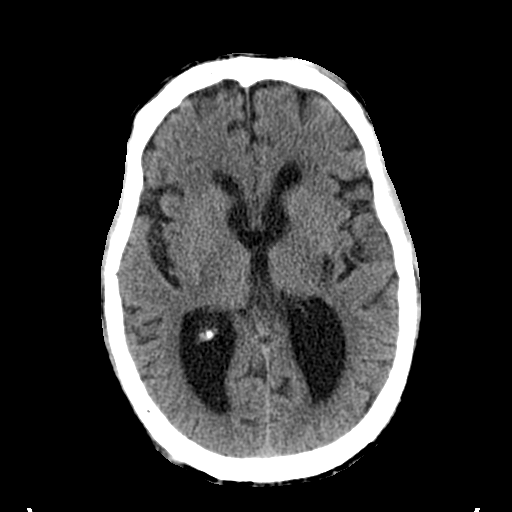
[im 19/36  brain]
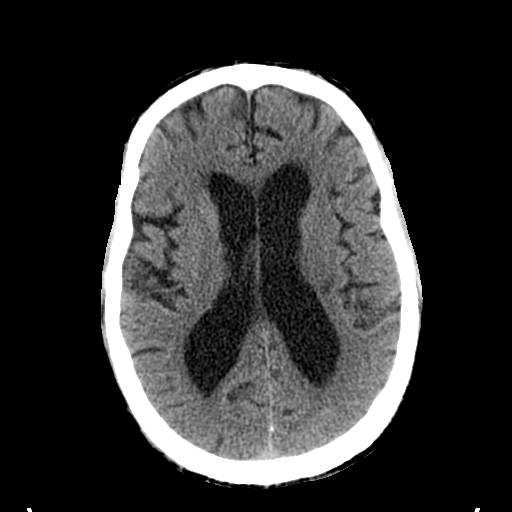
[im 19/36  bone]
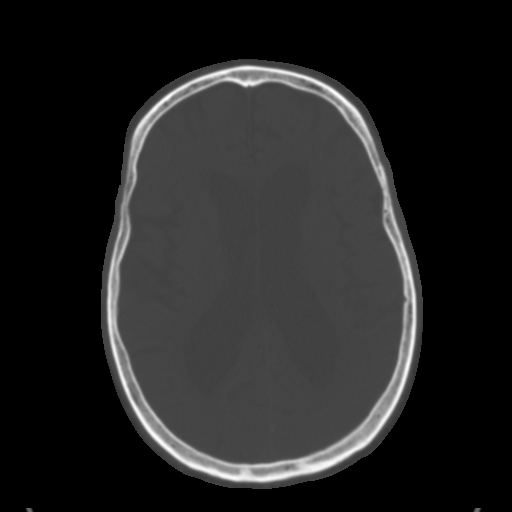
[im 21/36  brain]
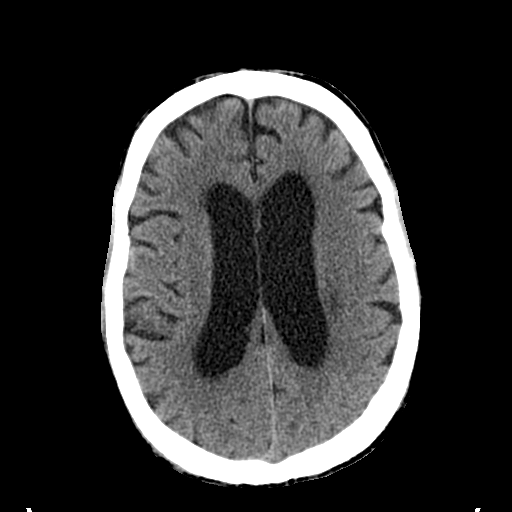
[im 23/36  brain]
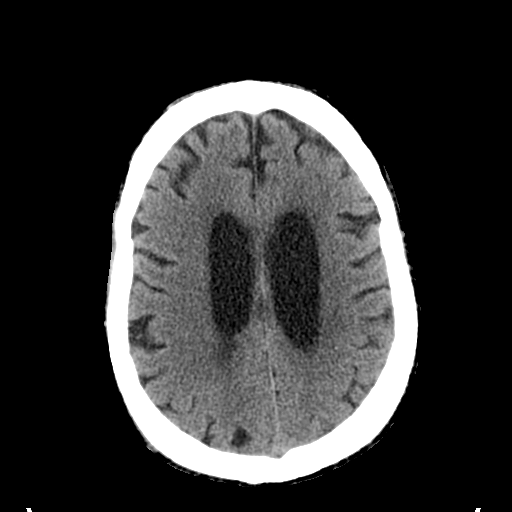
[im 26/36  brain]
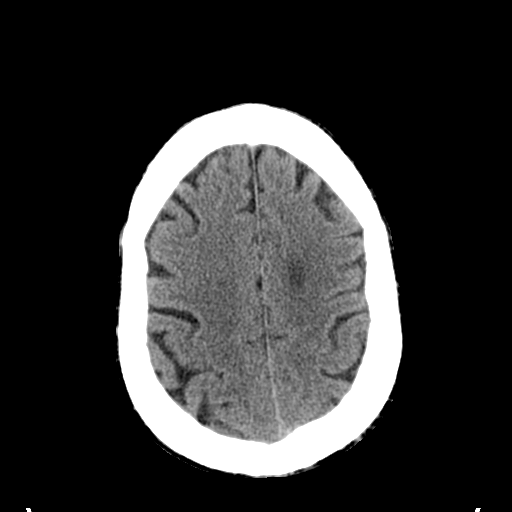
[im 27/36  brain]
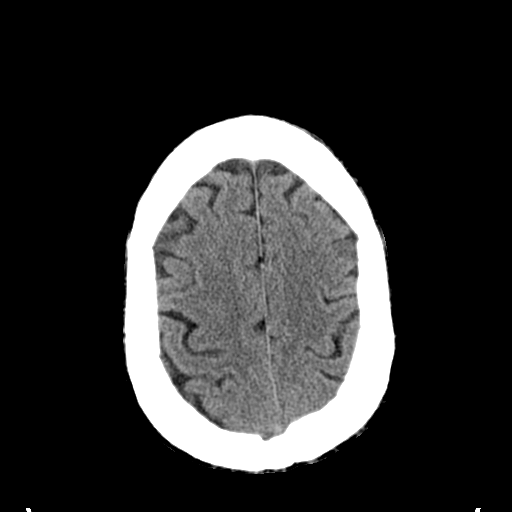
[im 27/36  bone]
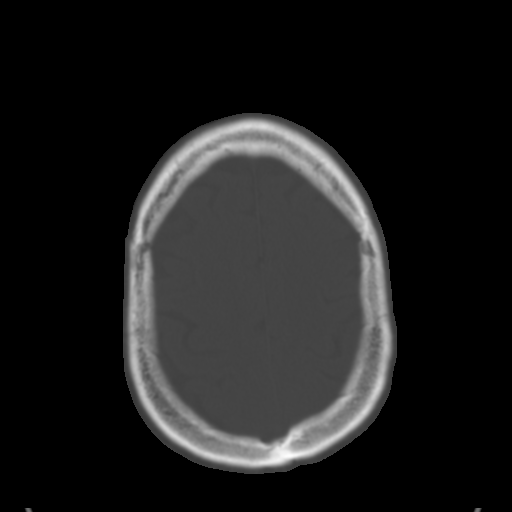
[im 29/36  brain]
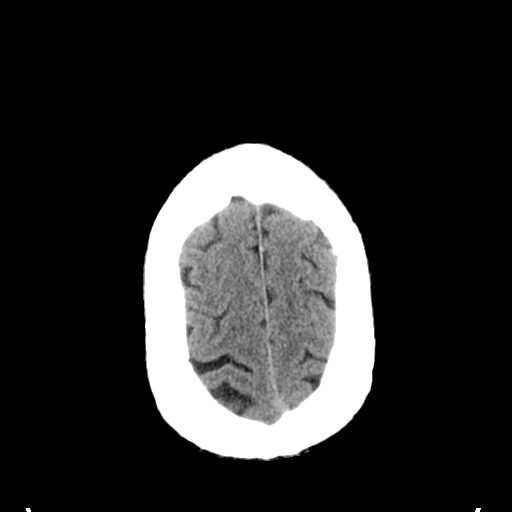
[im 32/36  brain]
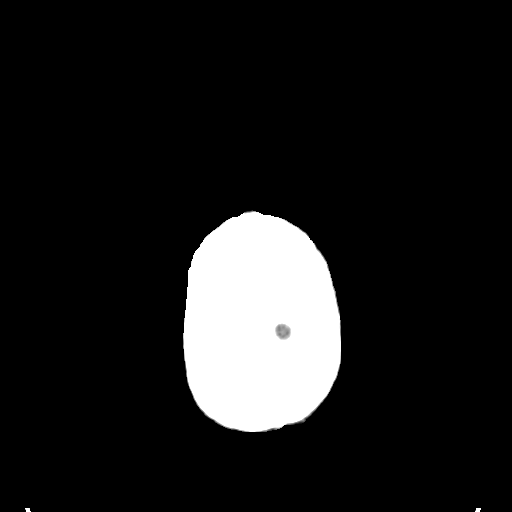
[im 34/36  brain]
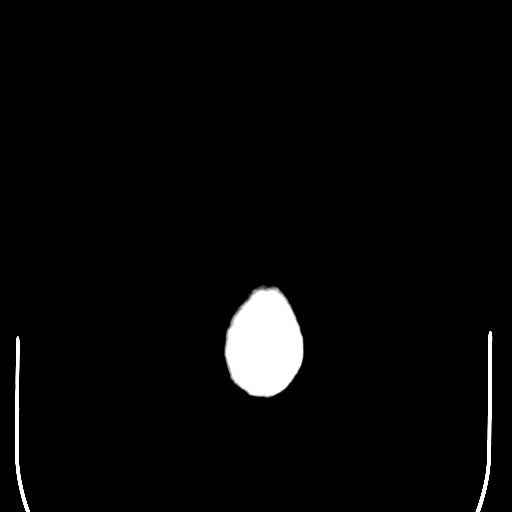

[16 of 30 positions shown; findings below may reference images not displayed]

FINDINGS: Visualized paranasal sinuses and mastoids are clear.
Visualized orbits and scalp soft tissues are within normal limits.
No acute osseous abnormality identified.

Stable cerebral volume. Stable ventricle size and configuration.
No midline shift, mass effect, or evidence of mass lesion.  Chronic
lacunar infarct involving the left corona radiata and external
capsule is stable.  Stable and normal for age gray and white matter
differentiation elsewhere.

No evidence of cortically based acute infarction identified.  No
acute intracranial hemorrhage identified.  Calcified
atherosclerosis at the skull base.
IMPRESSION: No acute intracranial abnormality.  Stable chronic small vessel
disease and chronic ventriculomegaly.

## 2012-11-07 IMAGING — CT CT PELVIS W/O CM
2 of 3 series · 14 of 32 positions shown, 20 images · non-contrast
Comparison: Radiographs 07/03/2011

CLINICAL DATA: Frequent falls.  Hip bruising bilaterally.

CT PELVIS WITHOUT CONTRAST
TECHNIQUE: Multidetector CT imaging of the pelvis was performed
following the standard protocol without intravenous contrast.

[Series 2: hip · axial · 0.70mm/px · z∈[-273,-58]mm · 9 of 108 slices shown, 15 images]
[im 11/108  soft-tissue]
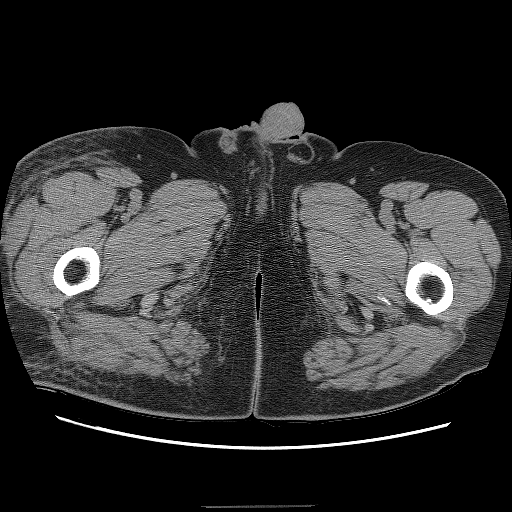
[im 11/108  bone]
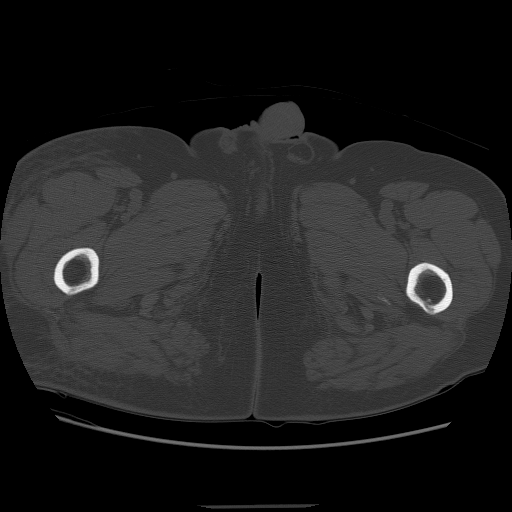
[im 22/108  soft-tissue]
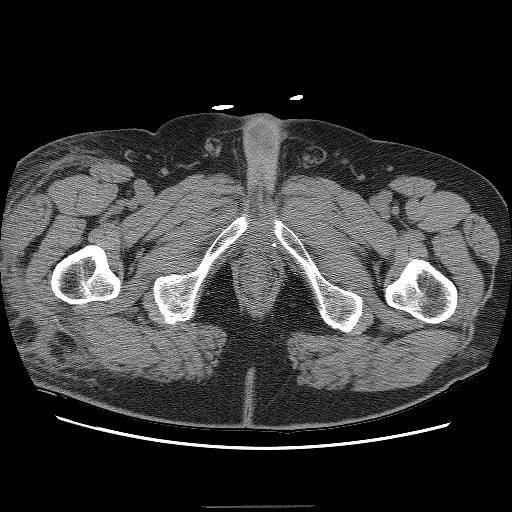
[im 33/108  soft-tissue]
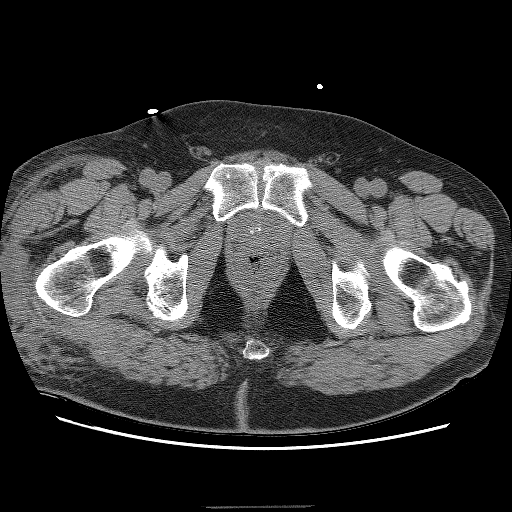
[im 43/108  soft-tissue]
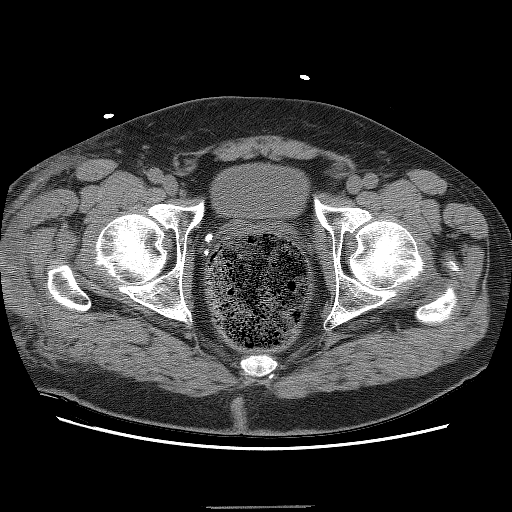
[im 54/108  soft-tissue]
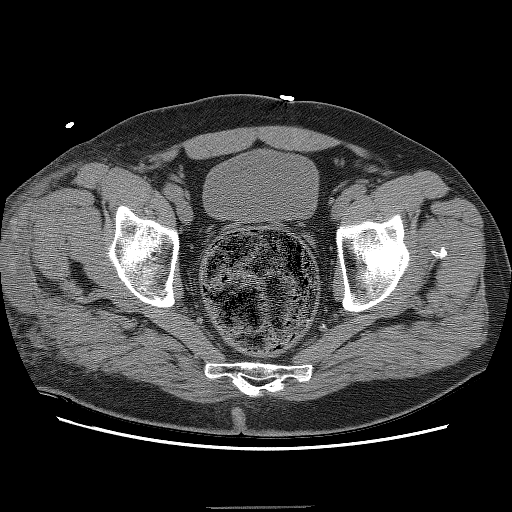
[im 65/108  soft-tissue]
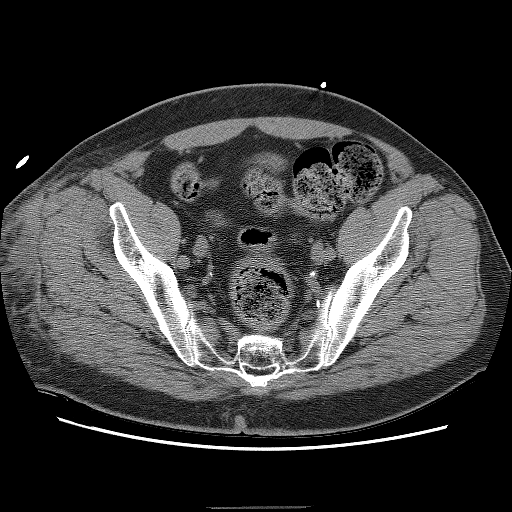
[im 65/108  lung]
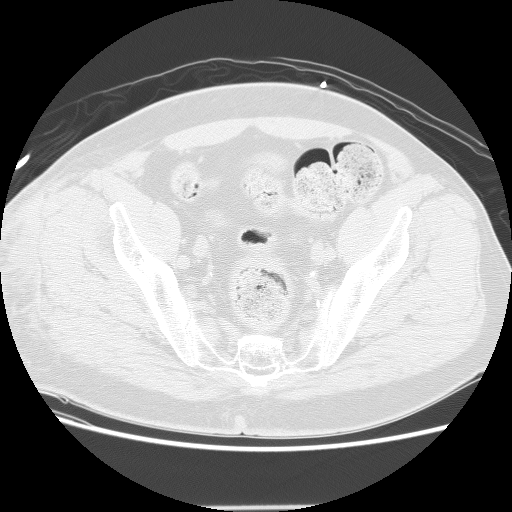
[im 75/108  soft-tissue]
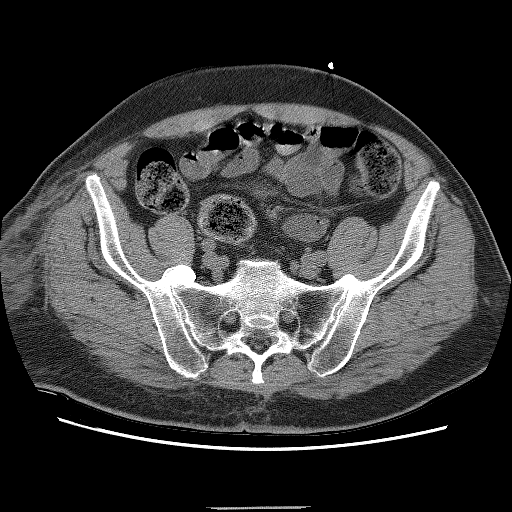
[im 75/108  lung]
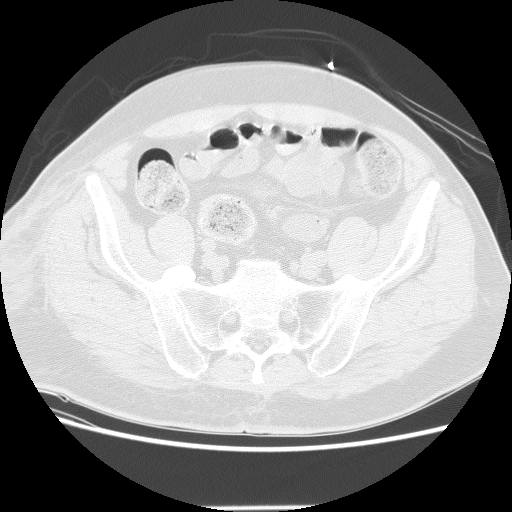
[im 86/108  soft-tissue]
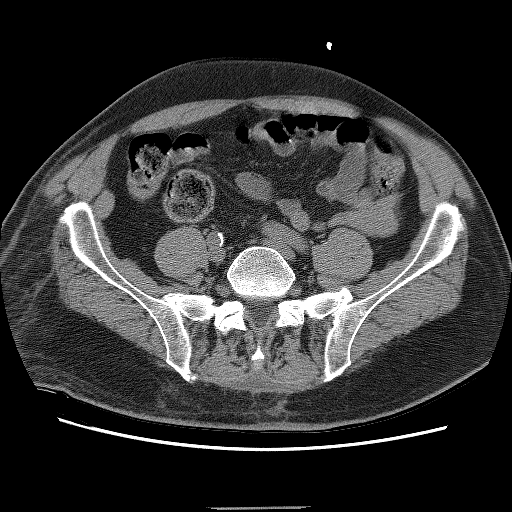
[im 86/108  lung]
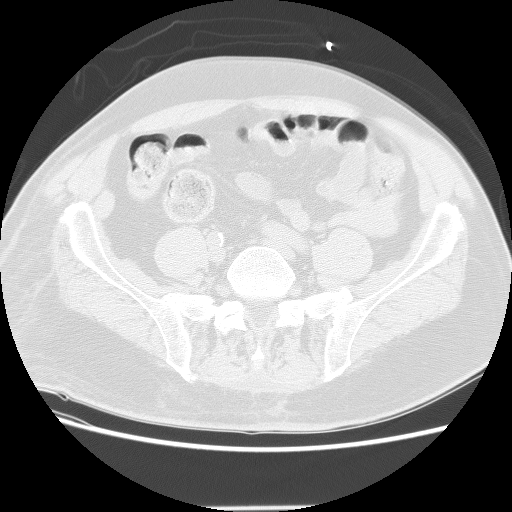
[im 97/108  soft-tissue]
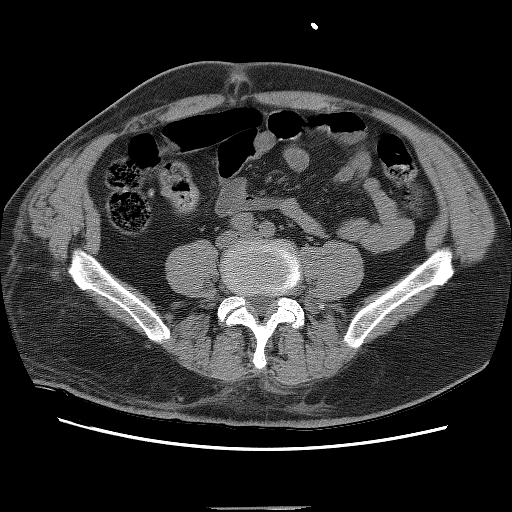
[im 97/108  lung]
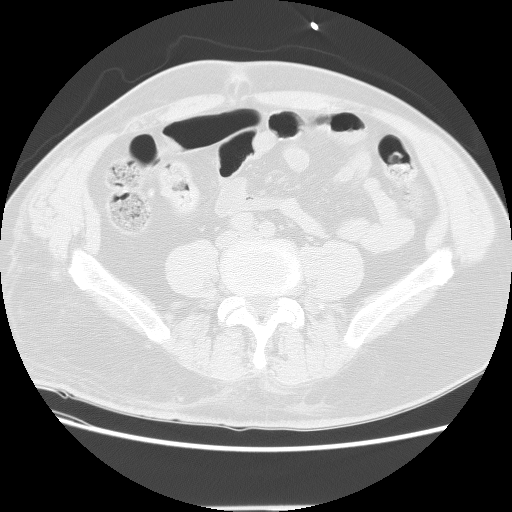
[im 97/108  bone]
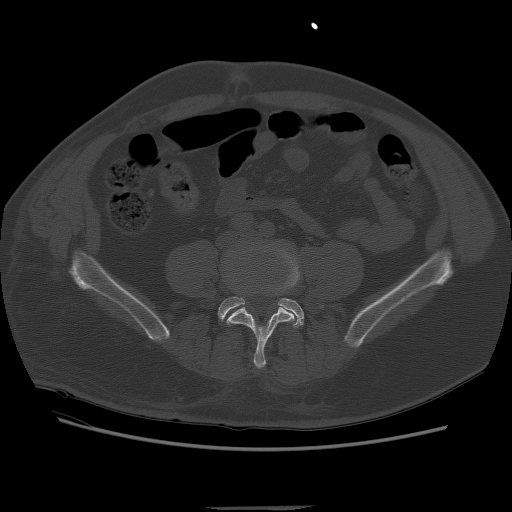

[Series 301: sag · sagittal · 0.70mm/px · 5 of 113 slices shown]
[im 11/113  soft-tissue]
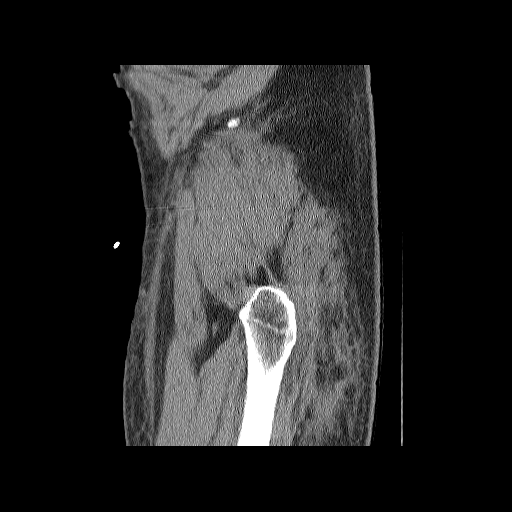
[im 21/113  soft-tissue]
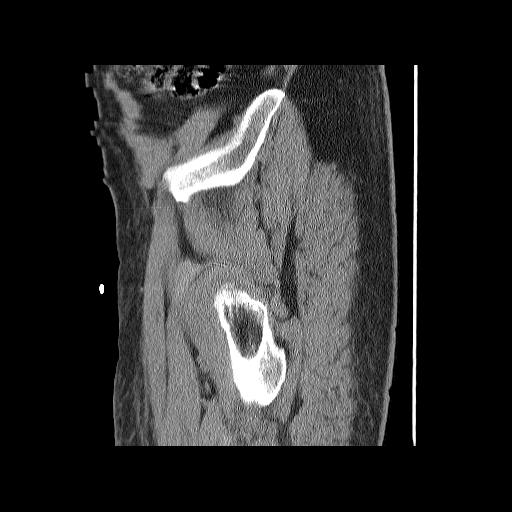
[im 41/113  soft-tissue]
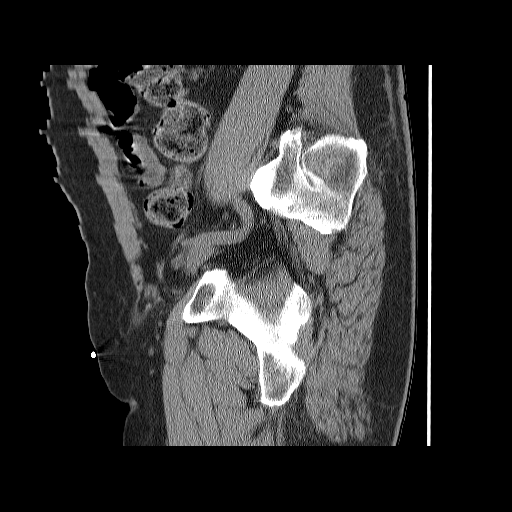
[im 51/113  soft-tissue]
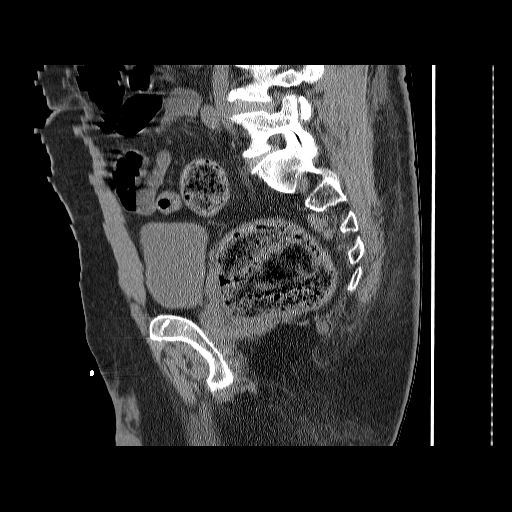
[im 62/113  soft-tissue]
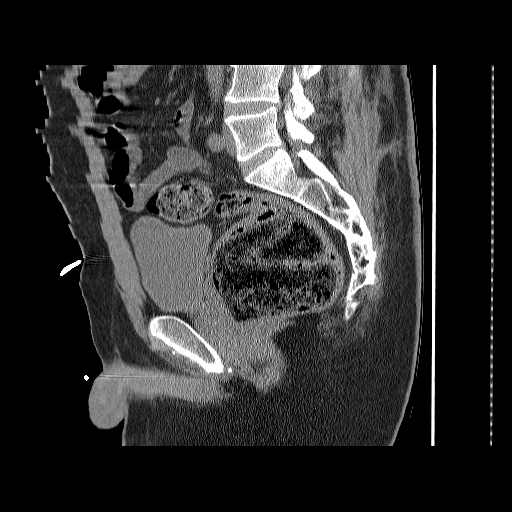

[14 of 32 positions shown; findings below may reference images not displayed]

FINDINGS: Both hips are in normal alignment.  Negative for hip or
pelvic fracture.  Mild joint space narrowing bilaterally without
acetabular fracture or AVN.  Soft tissue calcifications adjacent to
the lesser trochanter on the left due to calcific tendonitis.
Degenerative change and spurring in the SI joints bilaterally.
IMPRESSION: Negative for fracture.

## 2012-11-08 ENCOUNTER — Encounter (HOSPITAL_BASED_OUTPATIENT_CLINIC_OR_DEPARTMENT_OTHER): Payer: Self-pay | Admitting: *Deleted

## 2012-11-08 ENCOUNTER — Emergency Department (HOSPITAL_BASED_OUTPATIENT_CLINIC_OR_DEPARTMENT_OTHER)
Admission: EM | Admit: 2012-11-08 | Discharge: 2012-11-08 | Disposition: A | Discharge status: Other Acute Inpatient Hospital | Payer: Medicare Other | Attending: Emergency Medicine | Admitting: Emergency Medicine

## 2012-11-08 DIAGNOSIS — Z87828 Personal history of other (healed) physical injury and trauma: Secondary | ICD-10-CM | POA: Insufficient documentation

## 2012-11-08 DIAGNOSIS — F988 Other specified behavioral and emotional disorders with onset usually occurring in childhood and adolescence: Secondary | ICD-10-CM | POA: Insufficient documentation

## 2012-11-08 DIAGNOSIS — IMO0001 Reserved for inherently not codable concepts without codable children: Secondary | ICD-10-CM | POA: Insufficient documentation

## 2012-11-08 DIAGNOSIS — Z7982 Long term (current) use of aspirin: Secondary | ICD-10-CM | POA: Insufficient documentation

## 2012-11-08 DIAGNOSIS — J45909 Unspecified asthma, uncomplicated: Secondary | ICD-10-CM | POA: Insufficient documentation

## 2012-11-08 DIAGNOSIS — E782 Mixed hyperlipidemia: Secondary | ICD-10-CM | POA: Insufficient documentation

## 2012-11-08 DIAGNOSIS — Z8673 Personal history of transient ischemic attack (TIA), and cerebral infarction without residual deficits: Secondary | ICD-10-CM | POA: Insufficient documentation

## 2012-11-08 DIAGNOSIS — I2510 Atherosclerotic heart disease of native coronary artery without angina pectoris: Secondary | ICD-10-CM | POA: Insufficient documentation

## 2012-11-08 DIAGNOSIS — Z87891 Personal history of nicotine dependence: Secondary | ICD-10-CM | POA: Insufficient documentation

## 2012-11-08 DIAGNOSIS — F411 Generalized anxiety disorder: Secondary | ICD-10-CM | POA: Insufficient documentation

## 2012-11-08 DIAGNOSIS — Z8719 Personal history of other diseases of the digestive system: Secondary | ICD-10-CM | POA: Insufficient documentation

## 2012-11-08 DIAGNOSIS — Y9389 Activity, other specified: Secondary | ICD-10-CM | POA: Insufficient documentation

## 2012-11-08 DIAGNOSIS — Y929 Unspecified place or not applicable: Secondary | ICD-10-CM | POA: Insufficient documentation

## 2012-11-08 DIAGNOSIS — Z79899 Other long term (current) drug therapy: Secondary | ICD-10-CM | POA: Insufficient documentation

## 2012-11-08 DIAGNOSIS — W1809XA Striking against other object with subsequent fall, initial encounter: Secondary | ICD-10-CM | POA: Insufficient documentation

## 2012-11-08 DIAGNOSIS — S0181XA Laceration without foreign body of other part of head, initial encounter: Secondary | ICD-10-CM

## 2012-11-08 DIAGNOSIS — S0180XA Unspecified open wound of other part of head, initial encounter: Secondary | ICD-10-CM | POA: Insufficient documentation

## 2012-11-08 DIAGNOSIS — F319 Bipolar disorder, unspecified: Secondary | ICD-10-CM | POA: Insufficient documentation

## 2012-11-08 NOTE — ED Notes (Signed)
Pa  at bedside. 

## 2012-11-08 NOTE — ED Notes (Signed)
Resident of [facility] fell this am and hit his head against night stand las laceration to left side of forehead no loss of consciousness reported by staff at facility

## 2012-11-08 NOTE — ED Notes (Signed)
Report called to [place] at [facility] , instructed pt will being transported by ex wife, facility states verbal understanding
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
## 2012-11-11 ENCOUNTER — Ambulatory Visit: Payer: Medicare Other

## 2012-11-11 ENCOUNTER — Ambulatory Visit (INDEPENDENT_AMBULATORY_CARE_PROVIDER_SITE_OTHER): Payer: Medicare Other | Admitting: Family Medicine

## 2012-11-11 VITALS — BP 151/94 | HR 76 | Temp 98.5°F | Resp 16 | Ht 70.0 in | Wt 183.0 lb

## 2012-11-11 DIAGNOSIS — IMO0002 Reserved for concepts with insufficient information to code with codable children: Secondary | ICD-10-CM

## 2012-11-11 DIAGNOSIS — T148XXS Other injury of unspecified body region, sequela: Secondary | ICD-10-CM

## 2012-11-11 DIAGNOSIS — S0180XS Unspecified open wound of other part of head, sequela: Secondary | ICD-10-CM

## 2012-11-11 DIAGNOSIS — D72829 Elevated white blood cell count, unspecified: Secondary | ICD-10-CM

## 2012-11-11 DIAGNOSIS — R062 Wheezing: Secondary | ICD-10-CM

## 2012-11-11 DIAGNOSIS — S0081XS Abrasion of other part of head, sequela: Secondary | ICD-10-CM

## 2012-11-11 DIAGNOSIS — J209 Acute bronchitis, unspecified: Secondary | ICD-10-CM

## 2012-11-11 LAB — POCT CBC
Granulocyte percent: 73.6 %G (ref 37–80)
MCH, POC: 29.5 pg (ref 27–31.2)
MID (cbc): 0.7 (ref 0–0.9)
MPV: 8.2 fL (ref 0–99.8)
POC MID %: 5.9 %M (ref 0–12)
Platelet Count, POC: 327 10*3/uL (ref 142–424)
RBC: 5.09 M/uL (ref 4.69–6.13)
RDW, POC: 14.3 %

## 2012-11-11 MED ORDER — CEFDINIR 300 MG PO CAPS: 300.0000 mg | ORAL_CAPSULE | Freq: Two times a day (BID) | ORAL | Status: DC

## 2012-11-11 NOTE — Progress Notes (Signed)
Subjective: 62 year old man who lives at [facility]. He apparently forgets sometimes on his inability to stand well, and he has had some falls. A few days ago he had a fall and was taken to the [place] on [address]. He got stitches in the left side of his forehead. He also is recovering from a aspiration pneumonia. He has finished his antibiotics now. He is tender to touch in the right anterior chest wall. He has had a lot of coarse sounding congestion in his lungs  Objective: Large scabs on his forehand and nose. Wound on the left forehead with 4 sutures in it. His neck is supple. Chest clear on the left. On the right finished course rhonchi. His right anterior lower chest wall is tender to touch. Abdomen soft. Not edematous. Sutures were removed from the forehead and scabs gently removed off of his for head and nose. This causes little bit of pain.  Assessment: Facial abrasions, improved Wound left for head, improved Recurrent falls Right chest wall pain Pulmonary congestion, status post treatment for aspiration pneumonia  Plan: Chest x-ray and CBC  Results for orders placed in visit on 11/11/12  POCT CBC      Result Value Range   WBC 12.0 (*) 4.6 - 10.2 K/uL   Lymph, poc 2.5  0.6 - 3.4   POC LYMPH PERCENT 20.5  10 - 50 %L   MID (cbc) 0.7  0 - 0.9   POC MID % 5.9  0 - 12 %M   POC Granulocyte 8.8 (*) 2 - 6.9   Granulocyte percent 73.6  37 - 80 %G   RBC 5.09  4.69 - 6.13 M/uL   Hemoglobin 15.0  14.1 - 18.1 g/dL   HCT, POC 16.1  09.6 - 53.7 %   MCV 90.2  80 - 97 fL   MCH, POC 29.5  27 - 31.2 pg   MCHC 32.7  31.8 - 35.4 g/dL   RDW, POC 04.5     Platelet Count, POC 327  142 - 424 K/uL   MPV 8.2  0 - 99.8 fL   [facility] reading (PRIMARY) by  Dr. Alwyn Ren No acute changes.  Large stomach air bubble  He appears to still have a persisting bronchitis. I will go ahead and treat with a double course of antibiotics, Omnicef. He is to return if worse. The wound on the  face were debrided and the sutures were removed  Return if problems

## 2012-11-11 NOTE — Patient Instructions (Addendum)
Continue other medications  Begin Omnicef 300 mg twice daily for 10 days for the lung  If feverish or more congested to come back for recheck

## 2012-11-12 ENCOUNTER — Telehealth: Payer: Self-pay

## 2012-11-12 NOTE — Telephone Encounter (Signed)
Pts wife is calling for her husband stating that he is in a lot of pain in his ribs. She is wondering if the doctor could prescribe something for his pain She states that he stays at [facility] and that they use their own pharmacy and that someone could call there about getting the prescription filled [facility] number she gave is [phone] His wifes call  Back number is [phone]

## 2012-11-15 MED ORDER — TRAMADOL HCL 50 MG PO TABS: 50.0000 mg | ORAL_TABLET | Freq: Three times a day (TID) | ORAL | Status: DC | PRN

## 2012-11-15 NOTE — Telephone Encounter (Signed)
He can take TRAM 50 mg one every 6 hours as needed for pain we can call him #30. These medications need to be administered by the staff at the care facility where he is staying. He cannot have the medication in his room.

## 2012-11-15 NOTE — Telephone Encounter (Signed)
Printed faxed [phone]

## 2012-11-22 ENCOUNTER — Other Ambulatory Visit: Payer: Self-pay | Admitting: Radiology

## 2012-11-22 NOTE — Telephone Encounter (Signed)
Please review this Rx I [facility] a [facility], itis in my box. Sig on Cymbalta on fax indicate one 30 mg tablet, the Rx in computer indicates to take #3 30 mg tablets daily, review and advise.

## 2012-11-22 NOTE — Telephone Encounter (Signed)
He should be on Cymbalta 30 mg one tablet a day. The dosage of 90 mg a day is not accurate and he should just be on one tablet a day. He can have #30 refill for one year

## 2012-11-23 MED ORDER — DULOXETINE HCL 30 MG PO CPEP: 30.0000 mg | ORAL_CAPSULE | Freq: Every day | ORAL | Status: DC

## 2012-11-23 NOTE — Telephone Encounter (Signed)
Just making sure he got the note on [name]. He should be on Cymbalta 30 mg one a day

## 2012-11-23 NOTE — Telephone Encounter (Signed)
Please check and be sure you received the note on Kyle [name]. He is to be on Cymbalta 30 mg one a day.

## 2012-12-13 ENCOUNTER — Ambulatory Visit: Payer: Medicare Other | Admitting: Emergency Medicine

## 2012-12-13 ENCOUNTER — Telehealth: Payer: Self-pay

## 2012-12-13 NOTE — Telephone Encounter (Signed)
Dr.Daub, Pt is calling to speak you, pt will not discuss any details of what he was calling in reference to. Best# [phone] (This is the number to [facility])

## 2012-12-15 NOTE — Telephone Encounter (Signed)
Spoke with patient and he stated that he was calling us back from a call we placed a few weeks ago.   Looked at the call and it was in reference to a medicine being called into pharmacy.  Patient stated that he understood and had no questions at this time.

## 2012-12-27 ENCOUNTER — Ambulatory Visit: Payer: Medicare Other | Admitting: Emergency Medicine

## 2012-12-30 ENCOUNTER — Telehealth: Payer: Self-pay

## 2012-12-30 NOTE — Telephone Encounter (Signed)
Crystal from [facility] called reporting that the pt has increased confusion today and is very disoriented. They requested an order to check his urine for infection. I gave VO and they will fax an order for signature. Dr Cleta Alberts, [place]ain Childes

## 2012-12-30 NOTE — Telephone Encounter (Signed)
Please call and check on status the patient in the morning. If he continues to be more confused he needs to be seen in the emergency room for evaluation

## 2012-12-31 NOTE — Telephone Encounter (Signed)
Spoke to pt and he states that he did not have a UTI. He was tested and it came back negative. Pt reports he is feeling fine watching his Saturday morning television. He will let someone know if he starts feeling ill.

## 2013-01-04 ENCOUNTER — Telehealth: Payer: Self-pay | Admitting: *Deleted

## 2013-01-04 NOTE — Telephone Encounter (Signed)
[facility] requesting a refill on tramadol on 50mg .

## 2013-01-05 MED ORDER — TRAMADOL HCL 50 MG PO TABS: 50.0000 mg | ORAL_TABLET | Freq: Three times a day (TID) | ORAL | Status: DC | PRN

## 2013-01-05 NOTE — Telephone Encounter (Signed)
Rx printed. Meds ordered this encounter  Medications  . traMADol (ULTRAM) 50 MG tablet    Sig: Take 1 tablet (50 mg total) by mouth every 8 (eight) hours as needed for pain.    Dispense:  30 tablet    Refill:  0    Order Specific Question:  Supervising Provider    Answer:  DOOLITTLE, ROBERT P [[id number]]

## 2013-01-10 ENCOUNTER — Telehealth: Payer: Self-pay

## 2013-01-10 NOTE — Telephone Encounter (Signed)
[facility] sent request for Adderall 10 mg capsules, 3 caps QD. Their phone is [phone] and fax [phone].

## 2013-01-11 NOTE — Telephone Encounter (Signed)
Please get clarification, from [facility] and/or the pharmacy.  The last Rx for this appears to be for Adderall XR 30 mg 1 PO QAM.

## 2013-01-12 NOTE — Telephone Encounter (Signed)
Called pharmacy, they didn't answer the phone. Will try to CB later.

## 2013-01-28 NOTE — Telephone Encounter (Signed)
Called [facility] to make sure they got this Rx and was advised that this was done.

## 2013-02-01 ENCOUNTER — Encounter: Payer: Self-pay | Admitting: Emergency Medicine

## 2013-02-06 ENCOUNTER — Ambulatory Visit (INDEPENDENT_AMBULATORY_CARE_PROVIDER_SITE_OTHER): Payer: Medicare Other | Admitting: Emergency Medicine

## 2013-02-06 ENCOUNTER — Encounter: Payer: Self-pay | Admitting: Emergency Medicine

## 2013-02-06 VITALS — BP 158/88 | HR 66 | Temp 98.5°F | Resp 16

## 2013-02-06 DIAGNOSIS — Z23 Encounter for immunization: Secondary | ICD-10-CM

## 2013-02-06 DIAGNOSIS — E782 Mixed hyperlipidemia: Secondary | ICD-10-CM

## 2013-02-06 DIAGNOSIS — I25810 Atherosclerosis of coronary artery bypass graft(s) without angina pectoris: Secondary | ICD-10-CM

## 2013-02-06 DIAGNOSIS — I10 Essential (primary) hypertension: Secondary | ICD-10-CM

## 2013-02-06 DIAGNOSIS — E119 Type 2 diabetes mellitus without complications: Secondary | ICD-10-CM

## 2013-02-06 NOTE — Progress Notes (Signed)
  Subjective:    Patient ID: Kyle [name]., male    DOB: 11-22-1950, 62 y.o.   MRN: [id number]  HPI patient enters for followup of his diabetes. He is status post stroke with history of coronary disease and a history of bipolar disease currently on medications. He saw Dr. Donell Beers this morning. He has been increasingly depressed. He is currently a resident at [facility]. He is divorced from his wife. She still tries to help him at times. He has [facility] compound tended to in the morning and again in the late afternoon and evening. He has been pretty much confined to a wheelchair. Physical therapy was stopped after 3 weeks because he was not making progress.   Review of Systems     Objective:   Physical Exam patient is alert and cooperative. He is wheelchair-bound. There is some slight facial asymmetry. He has a resting tremor of the right arm. His chest was clear heart regular rate no murmurs examination of the feet reveals bilateral neuropathy. He has trace pulses in both feet. He has a deformity of the left second toe with callus formation of the base of the great toe. There is no skin breakdown.        Results for orders placed in visit on 02/06/13  GLUCOSE, POCT (MANUAL RESULT ENTRY)      Result Value Range   POC Glucose 90  70 - 99 mg/dl  POCT GLYCOSYLATED HEMOGLOBIN (HGB A1C)      Result Value Range   Hemoglobin A1C 7.2      Assessment & Plan:  His diabetes is incredibly better. He takes 20 units of 7030 mix twice a day his last hemoglobin A1c was over 10 and A1c today is 7.2 with a sugar of 90. He has had worsening of his dementia and asked to go home and live with his wife again and does not remember that he is divorced. They're going to look into a personal trainer to get him more mobile.

## 2013-02-07 LAB — COMPREHENSIVE METABOLIC PANEL
ALT: 18 U/L (ref 0–53)
AST: 17 U/L (ref 0–37)
Alkaline Phosphatase: 150 U/L — ABNORMAL HIGH (ref 39–117)
Calcium: 9.3 mg/dL (ref 8.4–10.5)
Chloride: 101 mEq/L (ref 96–112)
Creat: 0.9 mg/dL (ref 0.50–1.35)
Total Bilirubin: 0.3 mg/dL (ref 0.3–1.2)

## 2013-02-07 LAB — LIPID PANEL
HDL: 53 mg/dL (ref >39)
LDL Cholesterol: 57 mg/dL (ref 0–99)
Total CHOL/HDL Ratio: 2.9 Ratio
VLDL: 42 mg/dL — ABNORMAL HIGH (ref 0–40)

## 2013-02-07 LAB — CBC
MCHC: 34.3 g/dL (ref 30.0–36.0)
MCV: 83.4 fL (ref 78.0–100.0)
Platelets: 307 10*3/uL (ref 150–400)
RDW: 14.8 % (ref 11.5–15.5)
WBC: 8.8 10*3/uL (ref 4.0–10.5)

## 2013-02-08 ENCOUNTER — Telehealth: Payer: Self-pay

## 2013-02-08 NOTE — Telephone Encounter (Signed)
RN from [facility]  Requests a RF of pt's Adderall XR 10 mg, take 3 caps po Qam. Please call when ready for p/up.

## 2013-02-09 NOTE — Telephone Encounter (Signed)
He gets his prescription from Dr. Donell Beers. He saw him on Tuesday.

## 2013-02-10 ENCOUNTER — Telehealth: Payer: Self-pay

## 2013-02-10 NOTE — Telephone Encounter (Signed)
Spoke with dr Cleta Alberts about this and he states he does not write for this rx. States he has notified [facility] that he must get this rx from dr [employer] only.  States pt just saw dr Donell Beers within the last few days.   Notified [facility] via [facility] manager.   bf

## 2013-02-10 NOTE — Telephone Encounter (Signed)
Caretaker Elisha Ponder at [facility] calling regarding pts adderall rx. States pt is out and their pharmacy requires rx faxed to them to refill.  Informed her we cannot fax the rx, but are happy to have it available for pick up pending dr approval for the rx.  She understood.  Pt sees Dr Cleta Alberts.  Ph: [phone] Fax: [phone]

## 2013-02-10 NOTE — Telephone Encounter (Signed)
Called an [facility] for nurse that Dr Donell Beers Rxs this med for pt and he just saw him on Tues. Asked for them to req RF from him and CB if any other ?s.

## 2013-02-14 ENCOUNTER — Ambulatory Visit: Payer: Medicare Other | Admitting: Emergency Medicine

## 2013-02-16 ENCOUNTER — Other Ambulatory Visit: Payer: Self-pay | Admitting: Family Medicine

## 2013-02-16 ENCOUNTER — Other Ambulatory Visit: Payer: Self-pay | Admitting: Radiology

## 2013-02-16 DIAGNOSIS — F411 Generalized anxiety disorder: Secondary | ICD-10-CM

## 2013-02-16 MED ORDER — CLONAZEPAM 1 MG PO TABS: ORAL_TABLET | ORAL | Status: DC

## 2013-02-16 NOTE — Telephone Encounter (Signed)
Patient needs Rx for Klonopin. He is out, [facility] advised you increased to tid. Pended. Please advise, they want the Rx sent to [facility].

## 2013-02-17 ENCOUNTER — Telehealth: Payer: Self-pay

## 2013-02-17 DIAGNOSIS — F411 Generalized anxiety disorder: Secondary | ICD-10-CM

## 2013-02-17 NOTE — Telephone Encounter (Incomplete)
[name] from [facility] states that Kyle [name] is in need a rx for klonopin sent to them with the quantity since we increased the dosage from twice a day to three times a day. Best# [phone]

## 2013-02-18 NOTE — Telephone Encounter (Signed)
Please print a prescription for Klonopin to be taken 3 times a day and get it ready for me and I will fax it to [facility]

## 2013-02-19 ENCOUNTER — Telehealth: Payer: Self-pay

## 2013-02-19 MED ORDER — CLONAZEPAM 1 MG PO TABS: ORAL_TABLET | ORAL | Status: DC

## 2013-02-19 NOTE — Telephone Encounter (Signed)
Rx sent 

## 2013-02-19 NOTE — Telephone Encounter (Signed)
[facility] CALLED REGARDING PT'S KLONOPIN RX. SHE SAYS THAT HE'S BEEN OUT OF DAYS NOW AND THEY ARE  WAITING ON [place] TO APPROVE IT. PLEASE CALL NIKIAH @ [phone]

## 2013-02-19 NOTE — Telephone Encounter (Signed)
Please Call [facility] of [facility] at [phone] ASAP. Patient is out of his medication KLONOPIN and is starting to have behavior problems. Bascom Levels said we can also call Jillyn Hidden at same phone number, but Ext. [phone]. Patient has been out of the medication for a few days now. Bascom Levels stated that Dr. Cleta Alberts increased the dosage amount recently.

## 2013-03-28 ENCOUNTER — Telehealth: Payer: Self-pay

## 2013-03-28 NOTE — Telephone Encounter (Signed)
Crystal from [facility] would like for [place] to fax over a rx for lunesta for pt, she states that he takes 3mg  at night. Best# [phone]  Fax [phone] Crystal

## 2013-03-29 NOTE — Telephone Encounter (Signed)
Please present the prescription for Lunesta 3 mg one at bedtime. He can have #30 tablets and 5 refills.

## 2013-03-30 MED ORDER — ESZOPICLONE 3 MG PO TABS: 3.0000 mg | ORAL_TABLET | Freq: Every day | ORAL | Status: DC

## 2013-03-30 NOTE — Telephone Encounter (Signed)
Printed. Will fax once signed

## 2013-03-30 NOTE — Telephone Encounter (Addendum)
Crystal called back to check status because pt is almost out and she will need the Rx today in order for him to not be without. Dr Patsy Lager agreed (in Dr Ellis Parents absence from office) to sign for Rx so that RFs can be sent as Dr Cleta Alberts intended. I have faxed Rx to Crystal.

## 2013-03-30 NOTE — Addendum Note (Signed)
Addended by: Sheppard Plumber A on: 03/30/2013 09:39 AM   Modules accepted: Orders

## 2013-04-01 ENCOUNTER — Telehealth: Payer: Self-pay

## 2013-04-01 NOTE — Telephone Encounter (Signed)
Patient is having difficulty breathing and would like a rx refill renewal of the inhaler he was given in the emergency room awhile ago. He is still having difficulty breathing. Patient see's Dr. Cleta Alberts. Patients family member said that we need to call it in to [facility] and speak to a med tech at: [phone]. Please advise.

## 2013-04-03 NOTE — Telephone Encounter (Signed)
Please call this patient/caregiver. Get the details of his shortness of breath. I am happy to refill the albuterol, but he may need something more if he's needing it regularly.

## 2013-04-03 NOTE — Telephone Encounter (Signed)
Phone not accepting calls at this time, per recording.

## 2013-04-03 NOTE — Telephone Encounter (Signed)
Devona Konig, his emergency contact also. Left message for her to call me back.

## 2013-04-03 NOTE — Telephone Encounter (Signed)
Called again. Left message for Delsa Grana, nurse at [facility] to call me back regarding his SOB

## 2013-04-04 NOTE — Telephone Encounter (Signed)
Dr Cleta Alberts has received correspondence from [facility], Chest Xray ordered. Faxed to them. To Dr Cleta Alberts, expect report of Chest xray soon.

## 2013-04-07 ENCOUNTER — Telehealth: Payer: Self-pay | Admitting: Radiology

## 2013-04-07 NOTE — Telephone Encounter (Signed)
Patients xray he had done at [facility] is normal. I have attempted to call [facility], but have difficulty getting nurse to phone, have sent by fax, sent original report for scanning.

## 2013-04-09 ENCOUNTER — Ambulatory Visit (INDEPENDENT_AMBULATORY_CARE_PROVIDER_SITE_OTHER): Payer: Medicare Other | Admitting: Emergency Medicine

## 2013-04-09 ENCOUNTER — Encounter: Payer: Self-pay | Admitting: Emergency Medicine

## 2013-04-09 VITALS — BP 150/90 | HR 66 | Temp 98.3°F | Resp 16

## 2013-04-09 DIAGNOSIS — E119 Type 2 diabetes mellitus without complications: Secondary | ICD-10-CM

## 2013-04-09 LAB — GLUCOSE, POCT (MANUAL RESULT ENTRY): POC Glucose: 135 mg/dl — AB (ref 70–99)
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
## 2013-04-13 ENCOUNTER — Other Ambulatory Visit: Payer: Self-pay

## 2013-04-13 MED ORDER — TRAMADOL HCL 50 MG PO TABS: 50.0000 mg | ORAL_TABLET | Freq: Three times a day (TID) | ORAL | Status: DC | PRN

## 2013-04-17 ENCOUNTER — Non-Acute Institutional Stay (SKILLED_NURSING_FACILITY): Payer: Medicare Other | Admitting: Internal Medicine

## 2013-04-17 DIAGNOSIS — I10 Essential (primary) hypertension: Secondary | ICD-10-CM

## 2013-04-17 DIAGNOSIS — G911 Obstructive hydrocephalus: Secondary | ICD-10-CM

## 2013-04-17 DIAGNOSIS — E782 Mixed hyperlipidemia: Secondary | ICD-10-CM

## 2013-04-17 DIAGNOSIS — E119 Type 2 diabetes mellitus without complications: Secondary | ICD-10-CM

## 2013-04-24 ENCOUNTER — Non-Acute Institutional Stay (SKILLED_NURSING_FACILITY): Payer: Medicare Other | Admitting: Internal Medicine

## 2013-04-24 DIAGNOSIS — E1159 Type 2 diabetes mellitus with other circulatory complications: Secondary | ICD-10-CM

## 2013-04-28 ENCOUNTER — Non-Acute Institutional Stay (SKILLED_NURSING_FACILITY): Payer: Medicare Other | Admitting: Internal Medicine

## 2013-04-28 NOTE — Progress Notes (Signed)
Patient ID: Kyle [name]., male   DOB: 09-18-1950, 62 y.o.   MRN: [id number] [facility] Chief complaint; hyperglycemia History; this is a patient who was recently admitted to the visit was a type II diabetic care. He came from [facility] house after being reviewed for the possibility of normal pressure hydrocephalus. A quick review of this does not show me obvious final opinions on this as the patient apparently does have significant cerebrovascular disease with chronic stroke damage. In any case he comes to the facility taking metformin at 500 twice a day Lantus 10 units at bedtime and NovoLog 3 units 8 a.c. meals it CBGs are greater than 150. CBGs done yesterday were 308 at 12:10 AM 1:30 at 6:00 357 at 1148 and 412 at 7:00 PM and 4:30 had 9:00 PM. This morning his blood sugar was 389 at 6 AM. There does not seem to be any concurrent medical issues he does not complain of cough or dysuria. He is eating well at the lunch table today.  Impression/plan #1 poorly controlled type 2 diabetes. Most of the time his blood sugars seem to be above 300 although yesterday morning he is 113 which seems to be out of keeping with almost every other morning blood sugar. I'm going to raise his Lantus to 18 units at night. Currently we do not control of his blood sugars at any time during the day. #2 hydrocephalus. The patient was not felt to be a candidate for a VP shunt as he did not respond to high volume lumbar puncture in the past

## 2013-05-01 ENCOUNTER — Non-Acute Institutional Stay (SKILLED_NURSING_FACILITY): Payer: Medicare Other | Admitting: Internal Medicine

## 2013-05-01 DIAGNOSIS — R634 Abnormal weight loss: Secondary | ICD-10-CM

## 2013-05-08 ENCOUNTER — Non-Acute Institutional Stay (SKILLED_NURSING_FACILITY): Payer: Medicare Other | Admitting: Internal Medicine

## 2013-05-08 ENCOUNTER — Encounter: Payer: Self-pay | Admitting: Internal Medicine

## 2013-05-08 DIAGNOSIS — D638 Anemia in other chronic diseases classified elsewhere: Secondary | ICD-10-CM

## 2013-05-08 DIAGNOSIS — E119 Type 2 diabetes mellitus without complications: Secondary | ICD-10-CM

## 2013-05-08 DIAGNOSIS — D509 Iron deficiency anemia, unspecified: Secondary | ICD-10-CM | POA: Insufficient documentation

## 2013-05-08 DIAGNOSIS — E039 Hypothyroidism, unspecified: Secondary | ICD-10-CM

## 2013-05-08 DIAGNOSIS — I10 Essential (primary) hypertension: Secondary | ICD-10-CM

## 2013-05-08 NOTE — Progress Notes (Signed)
          PROGRESS NOTE  DATE: 05/08/2013  FACILITY: Nursing Home Location: [facility]  LEVEL OF CARE: SNF (31)  Routine Visit  CHIEF COMPLAINT:  Manage hypothyroidism, diabetes mellitus and hypertension  HISTORY OF PRESENT ILLNESS:  REASSESSMENT OF ONGOING PROBLEM(S):  HYPOTHYROIDISM: New problem.  The patient denies fatigue or constipation.  Last TSH on 05-04-13 is 6  7.474.  DM:pt's DM remains stable.  Pt denies polyuria, polydipsia, polyphagia, changes in vision or hypoglycemic episodes.  No complications noted from the medication presently being used.  Last hemoglobin A1c is: Not available  HTN: Pt 's HTN remains stable.  Denies CP, sob, DOE, pedal edema, headaches, dizziness or visual disturbances.  No complications from the medications currently being used.  Last BP : 147/81  PAST MEDICAL HISTORY : Reviewed.  No changes.  CURRENT MEDICATIONS: Reviewed per [facility]  REVIEW OF SYSTEMS:  GENERAL: no change in appetite, no fatigue, no weight changes, no fever, chills or weakness RESPIRATORY: no cough, SOB, DOE, wheezing, hemoptysis CARDIAC: no chest pain, edema or palpitations GI: no abdominal pain, diarrhea, constipation, heart burn, nausea or vomiting  PHYSICAL EXAMINATION  VS:  T 97.1      P 52     RR18      BP147/81     POX %     WT (Lb)167.2  GENERAL: no acute distress, normal body habitus EYES: conjunctivae normal, sclerae normal, normal eye lids NECK: supple, trachea midline, no neck masses, no thyroid tenderness, no thyromegaly LYMPHATICS: no LAN in the neck, no supraclavicular LAN RESPIRATORY: breathing is even & unlabored, BS CTAB CARDIAC: RRR, no murmur,no extra heart sounds, no edema GI: abdomen soft, normal BS, no masses, no tenderness, no hepatomegaly, no splenomegaly PSYCHIATRIC: the patient is alert & oriented to person, affect & behavior appropriate  LABS/RADIOLOGY:  12-14 total protein 5, alkaline phosphatase 134  otherwise liver profile normal 11-14 CBC normal, glucose 358 otherwise BMP normal  ASSESSMENT/PLAN:  Hypothyroidism-new problem. Check TSH in 6 weeks. Diabetes mellitus-check hemoglobin A1c Hypertension-blood pressure borderline Anemia of chronic disease-continue iron Hyperlipidemia -- check fasting lipid panel GERD-stable Dementia-check vitamin B12 level  CPT CODE: 86578

## 2013-05-15 ENCOUNTER — Other Ambulatory Visit: Payer: Self-pay | Admitting: *Deleted

## 2013-05-15 DIAGNOSIS — F411 Generalized anxiety disorder: Secondary | ICD-10-CM

## 2013-05-15 MED ORDER — CLONAZEPAM 1 MG PO TABS: ORAL_TABLET | ORAL | Status: DC

## 2013-05-31 ENCOUNTER — Encounter: Payer: Self-pay | Admitting: Internal Medicine

## 2013-05-31 DIAGNOSIS — G911 Obstructive hydrocephalus: Secondary | ICD-10-CM | POA: Insufficient documentation
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
## 2013-06-06 ENCOUNTER — Ambulatory Visit: Payer: Medicare Other | Admitting: Emergency Medicine

## 2013-06-07 ENCOUNTER — Telehealth: Payer: Self-pay

## 2013-06-07 ENCOUNTER — Encounter: Payer: Self-pay | Admitting: Internal Medicine

## 2013-06-07 DIAGNOSIS — E1159 Type 2 diabetes mellitus with other circulatory complications: Secondary | ICD-10-CM | POA: Insufficient documentation

## 2013-06-07 NOTE — Telephone Encounter (Signed)
That is fine as long as he is seeing the doctor their.. I am always here in happy to see Leonette Mostharles at any time to

## 2013-06-07 NOTE — Progress Notes (Signed)
Patient ID: Kyle Shireharles E [name], male   DOB: 1950/11/17, 63 y.o.   MRN: [id number]          PROGRESS NOTE  DATE: 04/24/2013    FACILITY:  [name]  LEVEL OF CARE: SNF (31)  Acute Visit  CHIEF COMPLAINT:  Manage diabetes mellitus.    HISTORY OF PRESENT ILLNESS: I was requested by the staff to assess the patient regarding above problem(s):  DM:pt's DM is unstable.  Staff report that patient's CBGs are mostly in the 200 range.  Pt denies polyuria, polydipsia, polyphagia, changes in vision or hypoglycemic episodes.  No complications noted from the medication presently being used.  Last hemoglobin A1c is:   Not available.    PAST MEDICAL HISTORY : Reviewed.  No changes.  [facility]  REVIEW OF SYSTEMS:  GENERAL: no change in appetite, no fatigue, no weight changes, no fever, chills or weakness RESPIRATORY: no cough, SOB, DOE,, wheezing, hemoptysis CARDIAC: no chest pain, edema or palpitations GI: no abdominal pain, diarrhea, constipation, heart burn, nausea or vomiting  PHYSICAL EXAMINATION  GENERAL: no acute distress, normal body habitus NECK: supple, trachea midline, no neck masses, no thyroid tenderness, no thyromegaly RESPIRATORY: breathing is even & unlabored, BS CTAB CARDIAC: RRR, no murmur,no extra heart sounds, no edema GI: abdomen soft, normal BS, no masses, no tenderness, no hepatomegaly, no splenomegaly PSYCHIATRIC: the patient is alert & oriented to person, affect & behavior appropriate  ASSESSMENT/PLAN:  Diabetes mellitus with vascular complications.  Uncontrolled.  Lantus 10 U q.h.s. started.    THN Metrics:   Aspirin 81 mg.  Nonsmoker.  BP:  143/85.    CPT CODE: [id number]

## 2013-06-07 NOTE — Telephone Encounter (Signed)
Dr. Cleta Albertsaub,  Pt's wife called to let you know that they are very sorry for missing Leonette Mostharles' appointment with you. She wants you to know that they have moved [facility] to [facility] in [place] and he has been seeing the doctor there. If you need to reach Erskine SquibbJane, her number is [phone]

## 2013-06-12 NOTE — Telephone Encounter (Signed)
Gave wife Dr Ellis Parentsaub's message.

## 2013-07-04 ENCOUNTER — Encounter: Payer: Self-pay | Admitting: Internal Medicine

## 2013-07-04 DIAGNOSIS — R634 Abnormal weight loss: Secondary | ICD-10-CM | POA: Insufficient documentation

## 2013-07-04 NOTE — Progress Notes (Signed)
Patient ID: Kyle Shireharles E Kirkland Jr., male   DOB: [birth year], 63 y.o.   MRN: [id number]          PROGRESS NOTE  DATE: 05/01/2013    FACILITY:  [name]  LEVEL OF CARE: SNF (31)  Acute Visit  CHIEF COMPLAINT:  Manage weight loss.    HISTORY OF PRESENT ILLNESS: I was requested by the staff to assess the patient regarding above problem(s):  Dietitian reports that patient has had a significant weight loss which is 7.4 pounds in one week.  Current body weight is 165.8 pounds.  Admit weight was 143.2 pounds.  P.o. Intake is fair.  Patient denies loss of appetite.    PAST MEDICAL HISTORY : Reviewed.  No changes.  [facility]  REVIEW OF SYSTEMS:  GENERAL: no change in appetite, no fatigue, no weight changes, no fever, chills or weakness RESPIRATORY: no cough, SOB, DOE,, wheezing, hemoptysis CARDIAC: no chest pain, edema or palpitations GI: no abdominal pain, diarrhea, constipation, heart burn, nausea or vomiting  PHYSICAL EXAMINATION  GENERAL: no acute distress, normal body habitus NECK: supple, trachea midline, no neck masses, no thyroid tenderness, no thyromegaly RESPIRATORY: breathing is even & unlabored, BS CTAB CARDIAC: RRR, no murmur,no extra heart sounds, no edema GI: abdomen soft, normal BS, no masses, no tenderness, no hepatomegaly, no splenomegaly PSYCHIATRIC: the patient is alert & oriented to person, affect & behavior appropriate  ASSESSMENT/PLAN:  Weight loss.  New problem.  Check TSH and liver profile.  Monitor weights for now.  Continue dietary follow-up.    THN Metrics:   Aspirin 81 mg.  Nonsmoker.       CPT CODE: [id number]

## 2013-09-19 ENCOUNTER — Non-Acute Institutional Stay (SKILLED_NURSING_FACILITY): Payer: Medicare Other | Admitting: Internal Medicine

## 2013-09-19 DIAGNOSIS — D638 Anemia in other chronic diseases classified elsewhere: Secondary | ICD-10-CM

## 2013-09-19 DIAGNOSIS — E039 Hypothyroidism, unspecified: Secondary | ICD-10-CM

## 2013-09-19 DIAGNOSIS — I10 Essential (primary) hypertension: Secondary | ICD-10-CM

## 2013-09-19 DIAGNOSIS — E119 Type 2 diabetes mellitus without complications: Secondary | ICD-10-CM

## 2013-09-21 ENCOUNTER — Non-Acute Institutional Stay (SKILLED_NURSING_FACILITY): Payer: Medicare Other | Admitting: Internal Medicine

## 2013-09-21 DIAGNOSIS — E1165 Type 2 diabetes mellitus with hyperglycemia: Principal | ICD-10-CM

## 2013-09-21 DIAGNOSIS — IMO0001 Reserved for inherently not codable concepts without codable children: Secondary | ICD-10-CM

## 2013-09-21 NOTE — Progress Notes (Signed)
                 PROGRESS NOTE  DATE: 09-19-13  FACILITY: Nursing Home Location: [facility]  LEVEL OF CARE: SNF (31)  Routine Visit  CHIEF COMPLAINT:  Manage hypothyroidism, diabetes mellitus and hypertension  HISTORY OF PRESENT ILLNESS:  REASSESSMENT OF ONGOING PROBLEM(S):  HYPOTHYROIDISM: New problem.  The patient denies fatigue or constipation.  Last TSH on 05-04-13 is 6  7.474, 1-15 TSH 2.503.  DM:pt's DM remains stable.  Pt denies polyuria, polydipsia, polyphagia, changes in vision or hypoglycemic episodes.  No complications noted from the medication presently being used.  Last hemoglobin A1c is: In 12-14 hemoglobin A1c 8.5  HTN: Pt 's HTN remains stable.  Denies CP, sob, DOE, pedal edema, headaches, dizziness or visual disturbances.  No complications from the medications currently being used.  Last BP : 147/81, 136/82  PAST MEDICAL HISTORY : Reviewed.  No changes.  [facility]S: [facility]  REVIEW OF SYSTEMS:  GENERAL: no change in appetite, no fatigue, no weight changes, no fever, chills or weakness RESPIRATORY: no cough, SOB, DOE, wheezing, hemoptysis CARDIAC: no chest pain, edema or palpitations GI: no abdominal pain, diarrhea, constipation, heart burn, nausea or vomiting  PHYSICAL EXAMINATION  VS: See vital signs section  GENERAL: no acute distress, normal body habitus EYES: conjunctivae normal, sclerae normal, normal eye lids NECK: supple, trachea midline, no neck masses, no thyroid tenderness, no thyromegaly LYMPHATICS: no LAN in the neck, no supraclavicular LAN RESPIRATORY: breathing is even & unlabored, BS CTAB CARDIAC: RRR, no murmur,no extra heart sounds, no edema GI: abdomen soft, normal BS, no masses, no tenderness, no hepatomegaly, no splenomegaly PSYCHIATRIC: the patient is alert & oriented to person, affect & behavior appropriate  LABS/RADIOLOGY:  12-14 total protein 5, alkaline phosphatase 134 otherwise liver  profile normal, HDL 36 otherwise fasting lipid panel normal, vitamin B12 level 305 11-14 CBC normal, glucose 358 otherwise BMP normal  ASSESSMENT/PLAN:  Hypothyroidism-well controlled. Diabetes mellitus-uncontrolled. check hemoglobin A1c Hypertension-blood pressure borderline Anemia of chronic disease-continue iron Hyperlipidemia -- well controlled. GERD-stable Dementia-stable  CPT CODE: [id number]

## 2013-09-22 NOTE — Progress Notes (Signed)
Patient ID: Kyle Shireharles E Favata Jr., male   DOB: 05-05-1951, 63 y.o.   MRN: [id number]            PROGRESS NOTE  DATE: 09/21/2013     [name]  LEVEL OF CARE: SNF (31)  Acute Visit  CHIEF COMPLAINT:  Manage diabetes mellitus.     HISTORY OF PRESENT ILLNESS: I was requested by the staff to assess the patient regarding above problem(s):  DM:pt's DM is unstable.  Pt denies polyuria, polydipsia, polyphagia, changes in vision or hypoglycemic episodes.  No complications noted from the medication presently being used.  Last hemoglobin A1c is:  On 09/20/2013:  Hemoglobin A1c 9.3.     PAST MEDICAL HISTORY : Reviewed.  No changes/see problem list  CURRENT MEDICATIONS: Reviewed per MAR/see medication list  REVIEW OF SYSTEMS:  GENERAL: no change in appetite, no fatigue, no weight changes, no fever, chills or weakness RESPIRATORY: no cough, SOB, DOE,, wheezing, hemoptysis CARDIAC: no chest pain, edema or palpitations GI: no abdominal pain, diarrhea, constipation, heart burn, nausea or vomiting  PHYSICAL EXAMINATION  VS:  T 98.5       P 54      RR 18      BP 146/84      WT (Lb) 176.4      GENERAL: no acute distress, normal body habitus NECK: supple, trachea midline, no neck masses, no thyroid tenderness, no thyromegaly RESPIRATORY: breathing is even & unlabored, BS CTAB CARDIAC: RRR, no murmur,no extra heart sounds, no edema GI: abdomen soft, normal BS, no masses, no tenderness, no hepatomegaly, no splenomegaly PSYCHIATRIC: the patient is alert & oriented to person, affect & behavior appropriate  ASSESSMENT/PLAN:  Diabetes mellitus.  Uncontrolled problem.  Increase metformin to 1000 mg b.i.d.      CPT CODE: [id number]       Angela CoxGayani Y Parisha Beaulac, MD [facility] [phone]

## 2013-10-18 ENCOUNTER — Non-Acute Institutional Stay (SKILLED_NURSING_FACILITY): Payer: Medicare Other | Admitting: Internal Medicine

## 2013-10-18 DIAGNOSIS — F319 Bipolar disorder, unspecified: Secondary | ICD-10-CM

## 2013-10-18 DIAGNOSIS — G4700 Insomnia, unspecified: Secondary | ICD-10-CM

## 2013-10-18 DIAGNOSIS — I6350 Cerebral infarction due to unspecified occlusion or stenosis of unspecified cerebral artery: Secondary | ICD-10-CM

## 2013-10-18 DIAGNOSIS — I639 Cerebral infarction, unspecified: Secondary | ICD-10-CM

## 2013-10-18 DIAGNOSIS — G911 Obstructive hydrocephalus: Secondary | ICD-10-CM

## 2013-10-18 DIAGNOSIS — I2510 Atherosclerotic heart disease of native coronary artery without angina pectoris: Secondary | ICD-10-CM

## 2013-10-18 DIAGNOSIS — D638 Anemia in other chronic diseases classified elsewhere: Secondary | ICD-10-CM

## 2013-10-18 DIAGNOSIS — E782 Mixed hyperlipidemia: Secondary | ICD-10-CM

## 2013-10-18 DIAGNOSIS — R001 Bradycardia, unspecified: Secondary | ICD-10-CM

## 2013-10-18 DIAGNOSIS — E039 Hypothyroidism, unspecified: Secondary | ICD-10-CM

## 2013-10-18 DIAGNOSIS — E1159 Type 2 diabetes mellitus with other circulatory complications: Secondary | ICD-10-CM

## 2013-10-18 DIAGNOSIS — I10 Essential (primary) hypertension: Secondary | ICD-10-CM

## 2013-10-18 DIAGNOSIS — I498 Other specified cardiac arrhythmias: Secondary | ICD-10-CM

## 2013-10-18 NOTE — Progress Notes (Signed)
Patient ID: Kyle [name]., male   DOB: [birth year], 63 y.o.   MRN: [id number]   this is a routine visit.  Level of care skilled.  Facility AF.  Chief complaint-medical management of dementia-diabetes coronary artery disease history CVA hiatal hernia anxiety depression hydrocephalus hypertension.  History of present illness.  Patient is a 63 year old male with a complex medical history.  He was hospitalized late last year with altered mental status and subsequently were admitted to this facility for rehabilitation.  In the hospital he was diagnosed with a history of hydrocephalus he was not thought to be a good surgical candidate this appears to have been at baseline.  Apparently there is some history of CVA he continues on aspirin and this apparently has been at baseline.  Patient also is a diabetic2 blood sugars apparently at one point had been significantly elevated in fact hemoglobin A1c was 9.3 in late April-he is now on Lantus 20 units each bedtime as well as NovoLog 3 units if CBGs greater than 150 sugars are running  better recently morning sugars have been in the mid 100s apparently at night blood sugars do get somewhat higher at times in the 200s occasional 300s   In addition to the Lantus he received NovoLog 3 units if CBGs are greater than 150-it appears even if sugars are significantly elevated in the high 200s to 300s they do normalize overnight-again there continues to be quite a bit of variability here  He is also on Glucophage 1000 mg twice a day  His blood pressure appears under decent control is usually 137/83-129/75-on exam tonight maybe get a pulse in the low 50s it appears listed pulses range from the 50s-60s recently he is on Lopressor 50 mg twice a day.-- also is on lisinopril 10 mg a day  Patient does have a significant history of depression and continues on Cymbalta as well as Wellbutrin-- He also is on Trileptal apparently for some history of bipolar  disorder although this is rather unclear from review of records.  He does have associated dementia and is on Aricept as well.  Patient also has a history of a hiatal hernia-continues on Dexilant-and ranitidine-he is not complaining of GERD symptoms today apparently had complained earlier in the week Will have to monitor this.  In regards to anxiety he is on clonazepam 1 mg twice a day-.  This may complaint this evening he has insomnia says he cannot sleep at night and would like something for that.  Family medical social history has been reviewed her previous progress note 09/19/2013 in admission note on 04/17/2013.  Medications have been reviewed per MAR.  Review of systems.  Somewhat limited since patient is a poor historian.  In general no complaints of fever or chills.  Skin does not complaining of any itching or rashes.  Eyes no visual changes.  Throat does not complaining of sore throat or difficulty swallowing.  Respiratory no complaints of shortness of breath or cough.  Cardiac no chest pain.  GI does not complaining of abdominal discomfort does have a history of hiatal hernia is not complaining of GERD-like symptoms today apparently had complained earlier in the week does not complaining of abdominal pain dysphagia nausea or vomiting.  Muscle skeletal does not complaining of joint pain.  Neurologic to fairly significant history here as noted above but he did not complain of any headache or dizziness or syncopal type episodes or numbness.  Psych-significant history of depression as well as anxiety.  Physical exam.  Temperature is 97.4 pulse on exam 51 respirations 16 blood pressure 137/83.  In general this is a frail late middle-age male in no distress lying in bed comfortably.  His skin is warm and dry.  Eyes pupils appear reactive light sclera and conjunctiva are clear visual acuity appears intact.  Oropharynx is clear mucous membranes moist.  Chest is  clear to auscultation no labored breathing.  Heart is regular rhythm bradycardic in the low 50s without murmur gallop or rub or significant lower extremity edema.  Abdomen is soft nontender with positive bowel sounds.  Muscle skeletal is able to move all extremities x4 apparently ambulation wheelchair but does not get up and use a walker apparently has significant lower extremity weakness although this was difficult to fully assess secondary to patient being in bed.  Appears to possibly have someright-sided weakness again somewhat difficult to fully ascertain--he does have a listed history of this.  Neurologic as stated above cranial nerves appear grossly intact speech is clear he does have some element of confusion dementia but appear to be largely appropriate during exam this evening.  Psych as noted above he is alert oriented to self and can carry a conversation although he is somewhat slow to respond.--Was able to tell me he was divorced and had 2 children a son and a daughter  Labs.  09/20/2013.  Hemoglobin A1c 9.3.  06/20/2013.  TSH-2.503.  05/09/2013.  Cholesterol 104-HDL 36-triglycerides 119-LDL 44.  B12 304.  Liver function tests within normal limits except albumin of 3.2 alkaline phosphatase 134.  04/18/2013.  WBC 9.0 hemoglobin 14.2 platelets 316.  Sodium 135 potassium 4.1 BUN 16 creatinine 0.8.  Assessment and plan.  #1-bradycardia this is not appear to be overtly symptomatic at this point will hold Lopressor for pulse less than 55 in order EKG-consider reducing Lopressor depending on EKG results.  #2-diabetes type 2-challenging situation it appears his blood sugars drop significantly overnight-at this point will update a hemoglobin A1c to see if we are making gains here he does continue on Lantus as well as NovoLog before meals and Glucophage.  #3-history hypertension this appears stable he is on Lopressor as well as lisinopril again we are monitoring his  heart rate and possibly may need to decrease his Lopressor-Will  hold for the above listed parameters.  #4-history of GERD-at this point apparently stable denies symptoms to nightcrawlers complained earlier apparently of some GERD-like symptoms heartburn-continues on ranitidine as well as Dexilant.  5-history of anemia likely chronic disease hemoglobin actually was over 14 back in November will update this.  #6 as history of hyperlipidemia-I do not see a recent cholesterol panel will update this as well as liver function tests do notet mildly elevated alkaline phosphatase on recent panel in December--he is on a statin  #7-dementia-he continues on Aricept this appears to be mild/moderate.  #8-history of depression bipolar disorder he continues on Cymbalta-Wellbutrin- as well as Trileptal -according to his nurses  this evening he has calm days and other days where he gets somewhat angry and agitated he appears to be calm and appropriate this evening--continue to monitor  #9-history of hypothyroidism??? we'll update a TSH TSH back in January was within normal limits  #10-insomnia-we'll do a trial course of Ambien 5 mg each bedtime when necessary do not give if he receives the p.m. Clonazepam.  #11-history coronary artery disease again he continues on aspirin as well as a statin.  #12 history of hydrocephalus again not a surgical candidate-per chart  review was not felt to be a candidate for shunting since he had not responded to high volume lumbar puncture as an outpatient  [id number]-of note greater than 45 minutes spent assessing patient-discussing his concerns as well as nursing concerns at bedside-reviewing his medical records and coordinating plan of care for numerous diagnoses-of note greater than 50% of time spent coordinating plan of care

## 2013-10-19 ENCOUNTER — Encounter: Payer: Self-pay | Admitting: Internal Medicine

## 2013-10-19 DIAGNOSIS — R001 Bradycardia, unspecified: Secondary | ICD-10-CM | POA: Insufficient documentation

## 2013-10-25 ENCOUNTER — Non-Acute Institutional Stay (SKILLED_NURSING_FACILITY): Payer: Medicare Other | Admitting: Internal Medicine

## 2013-10-25 ENCOUNTER — Encounter: Payer: Self-pay | Admitting: Internal Medicine

## 2013-10-25 DIAGNOSIS — I498 Other specified cardiac arrhythmias: Secondary | ICD-10-CM

## 2013-10-25 DIAGNOSIS — R001 Bradycardia, unspecified: Secondary | ICD-10-CM

## 2013-10-25 DIAGNOSIS — I10 Essential (primary) hypertension: Secondary | ICD-10-CM

## 2013-10-25 NOTE — Progress Notes (Signed)
Patient ID: Kyle [name]., male   DOB: November 14, 1950, 63 y.o.   MRN: [id number]   This is an acute visit.  Level of care skilled.  Facility AF.  Chief complaint-acute visit followup bradycardia.  History of present illness.  Patient is a 63 year old male with a complex medical history including history of hydrocephalus hypertension diabetes coronary artery disease.  I saw him recently for a routine visit and noted at times he has bradycardic readings-EKG was ordered which did show sinus bradycardia with a pulse rate in the 40s-he is on Lopressor this was decreased from 50 mg twice a day to 25 mg twice a day recent pulses are 80-60 --I got  58 on exam today  He does not complaining of chest pain shortness of breath or syncopal-type feelings.  He does have a history of coronary artery disease continues on aspirin as well as a statin.  His wife has asked nursing staff if he should have a cardiology consult as apparently he has not had one in years in per my review of medical records this does appear to be the case.  Family medical social history as been reviewed her previous progress note 10/18/2013 and I did note previous primary care provider notes including 11/11/2012.  Medications have been reviewed per MAR.  Review of systems somewhat limited since patient is a poor historian.  In general no complaints of fever or chills.  Respiratory denies any shortness of breath or cough.  Cardiac denies any chest pain.  GI does not complaining of abdominal discomfort apparently does have a significant history of GERD he is on Dexilant as well as Zantac  Muscle skeletal does not complaining of joint pain.  Physical exam  Pulse is 58 on exam respirations of 18 blood pressure 137/80 Gasch 140/60-131/80 most recently.  General this is a somewhat frail-appearing male in no distress sitting comfortably in his wheelchair.  His skin is warm and dry.  Oropharynx clear mucous membranes  moist.  Chest is clear to auscultation somewhat poor respiratory effort no labored breathing.  Heart is regular rate and rhythm slightly bradycardic at 58 without murmur gallop or rub.  Abdomen is soft nontender with positive bowel sounds  Neurologic appears at baseline does ambulate and wheelchair moves all extremities x4 continues with slow of speech.  Labs.  10/24/2013.  WBC 7.8 hemoglobin 12.8 platelets 192.   sodium 140 potassium 4.1 BUN 17 creatinine 1.0-liver function tests within normal limits.  Assessment and plan.  #1 bradycardia this appears improved with decrease in Lopressor continue to monitor this as well as blood pressure so far these appear relatively stable with some systolic variability--  I did review his records and cannot see a recent cardiology consult-I was unable to contact his wife via phone but will write an order for cardiology consult secondary to his history of coronary artery disease and hypertension-will have staff tried to reach wife to see if she has any preferences-he does continue on aspirin as well as a statin.  Clinically this appears to be relatively stable  #2-lab work appears to be fairly unremarkable hemoglobin has dropped about a point   Since November we'll update this in a couple weeks although I suspect this may be lab variation.  [id number] note greater than 25 minutes spent assessing patient-reviewing his medical records-and coordinating the plan of care-greater than 50% of time spent coordinating plan of care   .    Marland Kitchen

## 2013-11-03 ENCOUNTER — Other Ambulatory Visit: Payer: Self-pay

## 2013-11-03 MED ORDER — ZOLPIDEM TARTRATE 5 MG PO TABS: 5.0000 mg | ORAL_TABLET | Freq: Every evening | ORAL | Status: DC | PRN

## 2013-11-03 NOTE — Telephone Encounter (Signed)
RX sent to [facility] @ [phone]. Phone number [phone]  

## 2013-11-10 ENCOUNTER — Non-Acute Institutional Stay (SKILLED_NURSING_FACILITY): Payer: Medicare Other | Admitting: Internal Medicine

## 2013-11-10 DIAGNOSIS — I639 Cerebral infarction, unspecified: Secondary | ICD-10-CM

## 2013-11-10 DIAGNOSIS — G4700 Insomnia, unspecified: Secondary | ICD-10-CM

## 2013-11-10 DIAGNOSIS — I2510 Atherosclerotic heart disease of native coronary artery without angina pectoris: Secondary | ICD-10-CM

## 2013-11-10 DIAGNOSIS — I498 Other specified cardiac arrhythmias: Secondary | ICD-10-CM

## 2013-11-10 DIAGNOSIS — I2584 Coronary atherosclerosis due to calcified coronary lesion: Secondary | ICD-10-CM

## 2013-11-10 DIAGNOSIS — I6350 Cerebral infarction due to unspecified occlusion or stenosis of unspecified cerebral artery: Secondary | ICD-10-CM

## 2013-11-10 DIAGNOSIS — D638 Anemia in other chronic diseases classified elsewhere: Secondary | ICD-10-CM

## 2013-11-10 DIAGNOSIS — R001 Bradycardia, unspecified: Secondary | ICD-10-CM

## 2013-11-10 DIAGNOSIS — E1159 Type 2 diabetes mellitus with other circulatory complications: Secondary | ICD-10-CM

## 2013-11-10 DIAGNOSIS — F3110 Bipolar disorder, current episode manic without psychotic features, unspecified: Secondary | ICD-10-CM

## 2013-11-10 DIAGNOSIS — G911 Obstructive hydrocephalus: Secondary | ICD-10-CM

## 2013-11-10 DIAGNOSIS — R197 Diarrhea, unspecified: Secondary | ICD-10-CM | POA: Insufficient documentation

## 2013-11-10 NOTE — Progress Notes (Signed)
Patient ID: Kyle Shireharles E Hypes Jr., male   DOB: Oct 14, 1950, 63 y.o.   MRN: [id number]   this is a routine visit.  Level of care skilled.  Facility AF.   Chief complaint-medical management of dementia-diabetes coronary artery disease history CVA hiatal hernia anxiety depression hydrocephalus hypertension .  History of present illness.  Patient is a 63 year old male with a complex medical history.  He was hospitalized late last year with altered mental status and subsequently were admitted to this facility for rehabilitation.  In the hospital he was diagnosed with a history of hydrocephalus he was not thought to be a good surgical candidate this appears to have been at baseline.  Apparently there is some history of CVA he continues on aspirin and this apparently has been at baseline.  Patient also is a diabetic2 blood sugars apparently at one point had been significantly elevated in fact hemoglobin A1c was 9.3 in late April--this has improved to 8.3 per lab done earlier this month-he is now on Lantus 20 units each bedtime as well as NovoLog 3 units if CBGs greater than 150 sugars are running better recently morning sugars have been --Averaging in the mid 100s ranging from 101-213.  At noon averaging more t mid 100s to low 200s--later in the day quite variable ranging from the low 100s to occasionally about 300 there is really no consistency here apparently he does snack a lot later in the day  In addition to the Lantus he received NovoLog 3 units if CBGs are greater than 150-  He is also on Glucophage 1000 mg twice a day  He has a history of hypertension and is on lisinopril 10 mg a day as well as onopressor this was recently reduced secondary to bradycardia present pulses appear to be more in the mid 50s to 60s-blood pressures appear fairly satisfactory recently 136/82-126/83-143/86  Patient does have a significant history of depression and continues on Cymbalta as well as Wellbutrin--  He also  is on Trileptal apparently for some history of bipolar disorder although this is rather unclear from review of records.  He does have associated dementia and is on Aricept as well.  Patient also has a history of a hiatal hernia-continues on Dexilant-and ranitidine-he is not complaining of GERD symptoms today Will have to monitor this.  In regards to anxiety he is on clonazepam 1 mg twice a day-.   Patient is complaining of diarrhea the last couple days --he says this is fairly persistent does not really complain of any abdominal discomfort with this fever chills or nausea  Family medical social history has been reviewed her previous progress note 09/19/2013 in admission note on 04/17/2013.--also  progress note 10/25/2013   [facility]   Review of systems.  Somewhat limited since patient is a poor historian.  In general no complaints of fever or chills.  Skin does not complaining of any itching or rashes.  Eyes no visual changes.  Throat does not complaining of sore throat or difficulty swallowing.  Respiratory no complaints of shortness of breath or cough.  Cardiac no chest pain.  GI does not complaining of abdominal discomfort does have a history of hiatal hernia is not complaining of GERD- He is complaining of diarrhea-no nausea or vomiting noted  Muscle skeletal does not complaining of joint pain.  Neurologic to fairly significant history here as noted above but he did not complain of any headache or dizziness or syncopal type episodes or numbness.  Psych-significant history of depression as well as anxiety .  Physical exam.  Temperature 97.3 pulse58    rate is 18 blood pressure 136/82.  In general this is a frail late middle-age male in no distress sitting comfortably in his chair.  His skin is warm and dry.  Eyes pupils appear reactive light sclera and conjunctiva are clear visual acuity appears intact.  Oropharynx is clear mucous membranes moist.    Chest is clear to auscultation no labored breathing.  Heart is regular rhythm bradycardic in the high 50s without murmur gallop or rub or significant lower extremity edema.  Abdomen is soft nontender with positive bowel sounds.  Muscle skeletal is able to move all extremities x4 apparently ambulation wheelchair but doest get up and use a walker apparentlyat times    Appears to possibly have someright-sided weakness --apparently is not new  Neurologic as stated above cranial nerves appear grossly intact speech is clear he does have some element of confusion dementia but appear to be largely appropriate during exam this .  Psych as noted above he is alert oriented to self and can carry a conversation although he is somewhat slow to respond.--He did recognize me by name and could recall myr previous visit   Labs. 10/24/2013.  Cholesterol 80-HDL 26-LDL 42-triglycerides 61.  TSH-1.461.  Sodium 140 potassium 4.1 BUN 17 creatinine 1.0.  Liver function tests within normal limits albumin was 3.5.  Hemoglobin A1c 8.3 which is improved.  WBC 7.8 hemoglobin 12.8 platelets 192   09/20/2013.  Hemoglobin A1c 9.3.  06/20/2013.  TSH-2.503.  05/09/2013.  Cholesterol 104-HDL 36-triglycerides 119-LDL 44.  B12 304.  Liver function tests within normal limits except albumin of 3.2 alkaline phosphatase 134.  04/18/2013.  WBC 9.0 hemoglobin 14.2 platelets 316.  Sodium 135 potassium 4.1 BUN 16 creatinine 0.8.   Assessment and plan.  #1-bradycardia this is not appear to be overtly symptomatic at this point  .  #2-diabetes type 2-challenging situation it appears his blood sugars are quite variable- he does continue on Lantus as well as NovoLog before meals and Glucophage--at this point will monitor his hemoglobin A1c is improving-apparently he does snack a lot later in the day with some noncompliance which I suspect weeks to somewhat higher sugars at times later in the day--encourage a little stricter  compliance with diet although this may be a challenge.  #3-history hypertension this appears stable he is on Lopressor as well as lisinopril  .  #4-history of GERD-at this point apparently stable denies symptoms -continues on ranitidine as well as Dexilant.  5-history of anemia likely chronic disease hemoglobin actually appears satisfactory most recently 12.8 earlier this month.  #6 as history of hyperlipidemia- This appears stable per recent lipid panel he is on a statin liver function tests within normal limits #7-dementia-he continues on Aricept this appears to be mild/moderate.  #8-history of depression bipolar disorder he continues on Cymbalta-Wellbutrin- as well as Trileptal -according to his nurses this evening he has calm days and other days where he gets somewhat angry and agitated he appears to be calm and appropriate this evening--continue to monitor  #9-history of hypothyroidism??? Recent TSH is within normal limits  #10-insomnia-apparently this has improved he is not complaining of insomnia today--on Ambien 5 mg each bedtime when necessary do not give if he receives the p.m. Clonazepam.  #11-history coronary artery disease again he continues on aspirin as well as a statin--a cardiology consult is pending per family request.  #12 history of hydrocephalus again  not a surgical candidate-per chart review was not felt to be a candidate for shunting since he had not responded to high volume lumbar puncture as an outpatient #13 diarrhea-patient says thishas persisted several days-does not complaining of any associated symptoms nausea vomiting or abdominal discomfort-will order a C. difficile culture as well as metabolic panel to make sure electrolytes are stable--also will check CBC with differential   [id number]-of note greater than 35 minutes spent assessing patient-discussing his concerns as well as his status with nursing staff-and coordinating plan of care for numerous diagnoses-of note  greater than 50% of time spent coordinating plan of care

## 2013-11-15 ENCOUNTER — Non-Acute Institutional Stay (SKILLED_NURSING_FACILITY): Payer: Medicare Other | Admitting: Internal Medicine

## 2013-11-15 DIAGNOSIS — K140 Glossitis: Secondary | ICD-10-CM

## 2013-11-15 DIAGNOSIS — K219 Gastro-esophageal reflux disease without esophagitis: Secondary | ICD-10-CM | POA: Insufficient documentation

## 2013-11-15 DIAGNOSIS — K449 Diaphragmatic hernia without obstruction or gangrene: Secondary | ICD-10-CM

## 2013-11-15 NOTE — Progress Notes (Signed)
Patient ID: Kyle Shireharles E Njoku Jr., male   DOB: 08/17/1950, 63 y.o.   MRN: [id number]   This is an acute visit.  Level of care skilled.  [place]  Chief complaint-acute visit secondary to [place]-followup GERD.  History of present illness.  Patient is a pleasant 63 year old male who does have a history of a chronic hiatal hernia-he has had GERD-like symptoms this apparently waxes and wanes however according to family this is progressively getting a bit worse.  Apparently he is complaining of it more to the family.  He is on Dexilantalso on ranitidine twice a day  Patient is complaining of a sore left tongue tonight-somewhat unclear whether he bit his tongue or not.  Family medical social history as been reviewed most recently progress note on 11/10/2013.  Medications have been reviewed per MAR.  Review of systems.  In general no complaints of fever or chills.  Respiratory no complaints of cough or shortness of breath.  Cardiac no chest pain.  GI does at times complain of GERD-like symptoms burning discomfort-this is somewhat chronic according to family has increased.  Does not complaining of abdominal discomfort however no nausea or vomiting.  Mouth-again is complaining of a sore left side of tongue.  Physical exam.  Pulse is 58 respirations 18 blood pressure 138/86.  General is a pleasant male in no distress lying comfortably in bed.  Oropharynx clear mucous membranes moist on the left side of his tongue there does appear to be a slight erosion ulcer-I do not see any edema erythema or drainage.  Heart is regular rate and rhythm slightly bradycardic without murmur gallop or rub he does not have significant lower extremity edema.  Chest is clear to auscultation somewhat poor respiratory effort no labored breathing.  Abdomen is somewhat obese soft nontender with positive bowel sounds.  Labs. 10/24/2013.  Sodium 140 potassium 4.1 BUN 17 creatinine  1.0.  Liver function tests within normal limits.  WBC 7.8 hemoglobin 12.8 platelets 192.  Assessment and plan.  #1-history of GERD-with history of hiatal hernia-continues on Dexilant as well as ranitidine--he still continues to complain at times of GERD-like symptoms and has stated this numerous times to family apparently-we'll order a GI consult to get their input possibly more extensive workup.  #2-history of sore tongue-appears to have a small ulcer the left side of tongue-Will treat symptomatically with Magic mouthwash 5 cc 4 times a day x5 days and monitor  [id number]

## 2013-11-30 ENCOUNTER — Non-Acute Institutional Stay: Payer: Medicare Other | Admitting: Internal Medicine

## 2013-11-30 ENCOUNTER — Other Ambulatory Visit: Payer: Self-pay | Admitting: *Deleted

## 2013-11-30 ENCOUNTER — Encounter: Payer: Self-pay | Admitting: Internal Medicine

## 2013-11-30 ENCOUNTER — Encounter: Payer: Self-pay | Admitting: *Deleted

## 2013-11-30 DIAGNOSIS — F3160 Bipolar disorder, current episode mixed, unspecified: Secondary | ICD-10-CM

## 2013-11-30 DIAGNOSIS — G911 Obstructive hydrocephalus: Secondary | ICD-10-CM

## 2013-11-30 DIAGNOSIS — K219 Gastro-esophageal reflux disease without esophagitis: Secondary | ICD-10-CM

## 2013-11-30 DIAGNOSIS — I2510 Atherosclerotic heart disease of native coronary artery without angina pectoris: Secondary | ICD-10-CM

## 2013-11-30 DIAGNOSIS — R197 Diarrhea, unspecified: Secondary | ICD-10-CM

## 2013-11-30 DIAGNOSIS — D638 Anemia in other chronic diseases classified elsewhere: Secondary | ICD-10-CM

## 2013-11-30 DIAGNOSIS — I2584 Coronary atherosclerosis due to calcified coronary lesion: Secondary | ICD-10-CM

## 2013-11-30 DIAGNOSIS — R001 Bradycardia, unspecified: Secondary | ICD-10-CM

## 2013-11-30 DIAGNOSIS — E1159 Type 2 diabetes mellitus with other circulatory complications: Secondary | ICD-10-CM

## 2013-11-30 NOTE — Progress Notes (Signed)
Patient ID: Kyle [name]., male   DOB: Oct 10, 1950, 63 y.o.   MRN: [id number]   This is a routine visit.  Level of care skilled.  Facility [facility].  Chief complaint-medical management of dementia diabetes type 2 coronary artery disease history of CVA hiatelhernia anxiety depression hydrocephalus and hypertension.  Acute visit followup diarrhea  History of present illness.  Patient is a 63 year old male with the above stated medical history.  Most acute issue has been some intermittent diarrhea so far C. difficile has been negative apparently this is an intermittent issue and actually had GI consult is pending although this is more for his GERD-like symptoms.  He was hospitalized late last year with altered mental status and was admitted to this facility for rehabilitation.  In the hospital he was diagnosed with hydrocephalus thought not to be a good surgical candidate he appears to adapted well to this facility actually is ambulating about the hallway today.  He does have a history of CVA he is on aspirin.  He is also a type II diabetic blood sugars at one point had been significantly elevated although hemoglobin A1c appears to be trending down last month A1c was 8.3-he is on Lantus this was increased recently to 23 units is also on NovoLog 3 units before meals if CBG greater than 150-probably there is some dietary noncompliance which complicates matters.--He also is on Glucophage 1000 mg twice a day  Blood sugars in the morning appear to run 1:30-195 --occasionally 300+ but this is rare he does have some readings in the 200s this appears to be somewhat improved since Lantus was just increased.  Later in the day blood sugars run 180 to 200s range generally.  He also has some history of bradycardia his Lopressor as been decreased in recent pulses have been largely in the 50s to 60s it appears stable blood pressures most recently 125/79-108/70.  He does have a history of depression he  continues on Cymbalta as well as Wellbutrin this appears relatively stable also continues on Trileptal for apparent history of bipolar disorder.  In regards to dementia this appears to be fairly mild he is on Aricept.  Regards to his GI issues he has a history of hiatal hernia is on Dexilentl and and ranitidine apparently he has complained of some increased GERD-like symptoms and a GI consult is pending  His main complaint today again he is diarrhea which is intermittent.  Family medical social history as been reviewed from previous progress notes including 10/18/2013 in 10/25/2013.  Medications have been reviewed per [facility] review of systems.  General no complaints of fever or chills.  Skin does not complaining of any rash or itching.  Eyes no visual changes.  Throat does not claim any sore throat or dysphagia although does complain of GERD-like symptoms at times.  Respiratory no complaints of shortness of breath or cough.  Cardiac no chest pain.  GI he does not complain of any abdominal discomfort with this diarrhea or nausea or vomiting does have a history of hiatal hernia with complaints of GERD-like symptoms after eating.  She still is not complaining of joint pain is ambulating about facility in wheelchair today.  Neurologic does not complaining of any headache dizziness or syncopal type episodes she does have a history of hydrocephalus.  Psych history of depression anxiety bipolar disorder which apparently is fairly stable although he does have some increased anxiety at times according to nursing staff.  Physical exam.  He is afebrile pulse  56 respirations 20 blood pressure 125/79.  In general this is a frail late middle-age male in no distress sitting comfortably in his wheelchair.  His skin is warm and dry.  Eyes pupils appear reactive to light visual acuity appears grossly intact sclera and conjunctiva are clear.  Oropharynx is clear mucous membranes moist.  Chest  is clear to auscultation there is no labored breathing.  Heart is regular rhythm somewhat distant heart sounds pulses in the upper 50s  Abdomen is soft somewhat protuberant nontender with active bowel sounds.  Muscle skeletal is able to move all extremities x4 ambulates in a wheelchair he continued with lower extremity weakness this a  Neurologic-cranial nerves appear grossly intact his speech is clear -does have some history of right-sided weakness.  Psych he is pleasant and oriented to self and carries on a conversation that is appropriate somewhat slow at times to respond.  Labs.  11/09/2013.  WBC 9.1 hemoglobin 14.2 platelets 221.  2 second 2015.  Sodium 140 potassium 4.1 BUN 17 creatinine 1.0.  Total protein 5.2 liver function tests within normal limits  Hemoglobin A1c-8.3 this is down from previous levels.  10/20/2013.  Cholesterol 80-HDL 26-LDL 42-triglycerides 61-.  TSH-1.461.  Assessment and plan.  #1-history of diarrhea-he does have a GI consult pending again this is also for his history of increased GERD-like symptoms-will order a CDiF as well as a metabolic panel amylase lipase stool cultures for ova parasites as well as fecal leukocytes--  #2-history of bradycardia with history coronary artery disease this appears stable Lopressor has been decreased he does have a cardiology consult pending  #3-diabetes type 2 CBGs appear to be slowly improving hemoglobins A1c has improved with a few more readings would consider increasing Lantus once again but would like to get a few more readings since this was just recently changed.  #4 hypertension-this appears stable blood pressure as well as lisinopril again we are updating a BMP.  #5 history of GERD-he continues on ranitidine as well as Dexilant--he does continue to complain of some discomfort with eating and he has a GI consult pending.  #6-history of anemia likely chronic disease this appears to be stable but will  update CBC most recent hemoglobin 14.2.  #7 hyperlipidemia he is on a statin recent cholesterol panel appeared to be satisfactory liver function tests were fairly unremarkable at this point continue to monitor.  #8-history dementia this appears to be mild/moderate he is on Aricept.  #9 past history of bipolar disorder depression-he is on Cymbalta Wellbutrin as well as Trileptal this appears to be relatively stable --of note he was recently started on Depakote I believe by psychiatric services will obtain an updated Depakote level  10 history of hypothyroidism recent TSH was within normal limits he is not on any medication   11 history coronary artery disease he is on aspirin as well as a statin again he does have a cardiology consult pending.  #12 history of hydrocephalus considered not a surgical candidate at this point appears to be stable mental status appears to be at baseline   [id number]-of note greater than 40 minutes spent assessing patient-discussing his concerns-and coordinating and formulating a plan of care for numerous diagnoses-of note greater than 50% of time spent coordinating plan of care.  .Marland Kitchen

## 2013-12-05 LAB — BASIC METABOLIC PANEL
BUN: 15 mg/dL (ref 4–21)
Creatinine: 0.9 mg/dL (ref 0.6–1.3)
GLUCOSE: 136 mg/dL
Potassium: 3.7 mmol/L (ref 3.4–5.3)
SODIUM: 138 mmol/L (ref 137–147)

## 2013-12-05 LAB — CBC AND DIFFERENTIAL
HCT: 38 % — AB (ref 41–53)
HEMOGLOBIN: 12.5 g/dL — AB (ref 13.5–17.5)
Platelets: 184 10*3/uL (ref 150–399)
WBC: 7.2 10^3/mL

## 2013-12-06 ENCOUNTER — Ambulatory Visit (INDEPENDENT_AMBULATORY_CARE_PROVIDER_SITE_OTHER): Payer: Medicare Other | Admitting: Internal Medicine

## 2013-12-06 ENCOUNTER — Encounter: Payer: Self-pay | Admitting: Internal Medicine

## 2013-12-06 VITALS — BP 121/75 | HR 58 | Ht 71.0 in | Wt 171.0 lb

## 2013-12-06 DIAGNOSIS — I739 Peripheral vascular disease, unspecified: Secondary | ICD-10-CM

## 2013-12-06 DIAGNOSIS — I779 Disorder of arteries and arterioles, unspecified: Secondary | ICD-10-CM

## 2013-12-06 DIAGNOSIS — I2510 Atherosclerotic heart disease of native coronary artery without angina pectoris: Secondary | ICD-10-CM

## 2013-12-06 DIAGNOSIS — I6350 Cerebral infarction due to unspecified occlusion or stenosis of unspecified cerebral artery: Secondary | ICD-10-CM

## 2013-12-06 DIAGNOSIS — R9431 Abnormal electrocardiogram [ECG] [EKG]: Secondary | ICD-10-CM

## 2013-12-06 DIAGNOSIS — I639 Cerebral infarction, unspecified: Secondary | ICD-10-CM

## 2013-12-06 DIAGNOSIS — E1159 Type 2 diabetes mellitus with other circulatory complications: Secondary | ICD-10-CM

## 2013-12-06 DIAGNOSIS — I10 Essential (primary) hypertension: Secondary | ICD-10-CM

## 2013-12-06 DIAGNOSIS — E782 Mixed hyperlipidemia: Secondary | ICD-10-CM

## 2013-12-06 NOTE — Patient Instructions (Signed)
Your physician has requested that you have an echocardiogram. Echocardiography is a painless test that uses sound waves to create images of your heart. It provides your doctor with information about the size and shape of your heart and how well your heart's chambers and valves are working. This procedure takes approximately one hour. There are no restrictions for this procedure.  Your physician has requested that you have a carotid duplex. This test is an ultrasound of the carotid arteries in your neck. It looks at blood flow through these arteries that supply the brain with blood. Allow one hour for this exam. There are no restrictions or special instructions.  Your physician recommends that you schedule a follow-up appointment in: 1 year with Dr. Rennis GoldenHilty.
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
## 2013-12-11 ENCOUNTER — Other Ambulatory Visit: Payer: Self-pay | Admitting: *Deleted

## 2013-12-11 ENCOUNTER — Encounter (HOSPITAL_COMMUNITY): Payer: Medicare Other

## 2013-12-11 ENCOUNTER — Ambulatory Visit (HOSPITAL_COMMUNITY): Payer: Medicare Other

## 2013-12-11 MED ORDER — ZOLPIDEM TARTRATE 5 MG PO TABS: ORAL_TABLET | ORAL | Status: DC

## 2013-12-11 NOTE — Telephone Encounter (Signed)
[facility] 

## 2013-12-25 ENCOUNTER — Ambulatory Visit (HOSPITAL_COMMUNITY)
Admission: RE | Admit: 2013-12-25 | Discharge: 2013-12-25 | Disposition: A | Discharge status: Home / Self Care | Payer: Medicare Other | Source: Ambulatory Visit | Attending: Cardiovascular Disease | Admitting: Cardiovascular Disease

## 2013-12-25 DIAGNOSIS — I2510 Atherosclerotic heart disease of native coronary artery without angina pectoris: Secondary | ICD-10-CM | POA: Diagnosis not present

## 2013-12-25 DIAGNOSIS — R9431 Abnormal electrocardiogram [ECG] [EKG]: Secondary | ICD-10-CM | POA: Diagnosis present

## 2013-12-25 DIAGNOSIS — I6350 Cerebral infarction due to unspecified occlusion or stenosis of unspecified cerebral artery: Secondary | ICD-10-CM

## 2013-12-25 DIAGNOSIS — I739 Peripheral vascular disease, unspecified: Secondary | ICD-10-CM

## 2013-12-25 DIAGNOSIS — I779 Disorder of arteries and arterioles, unspecified: Secondary | ICD-10-CM

## 2013-12-25 DIAGNOSIS — R0989 Other specified symptoms and signs involving the circulatory and respiratory systems: Secondary | ICD-10-CM

## 2013-12-25 DIAGNOSIS — I639 Cerebral infarction, unspecified: Secondary | ICD-10-CM

## 2013-12-25 DIAGNOSIS — I519 Heart disease, unspecified: Secondary | ICD-10-CM

## 2013-12-25 NOTE — Progress Notes (Signed)
Carotid Duplex Completed. Tashiya Souders, BS, RDMS, RVT  

## 2013-12-25 NOTE — Progress Notes (Signed)
2D Echocardiogram Complete.  12/25/2013   Greydis Stlouis, RDCS    This was a technically difficult study.

## 2013-12-29 ENCOUNTER — Non-Acute Institutional Stay (SKILLED_NURSING_FACILITY): Payer: Medicare Other | Admitting: Internal Medicine

## 2013-12-29 DIAGNOSIS — E782 Mixed hyperlipidemia: Secondary | ICD-10-CM

## 2013-12-29 DIAGNOSIS — F3160 Bipolar disorder, current episode mixed, unspecified: Secondary | ICD-10-CM

## 2013-12-29 DIAGNOSIS — K219 Gastro-esophageal reflux disease without esophagitis: Secondary | ICD-10-CM

## 2013-12-29 DIAGNOSIS — F319 Bipolar disorder, unspecified: Secondary | ICD-10-CM

## 2013-12-29 DIAGNOSIS — I10 Essential (primary) hypertension: Secondary | ICD-10-CM

## 2013-12-29 DIAGNOSIS — E119 Type 2 diabetes mellitus without complications: Secondary | ICD-10-CM

## 2013-12-29 DIAGNOSIS — D638 Anemia in other chronic diseases classified elsewhere: Secondary | ICD-10-CM
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
## 2014-01-03 NOTE — Progress Notes (Signed)
LMTCB for test results (carotid doppler) 

## 2014-01-10 ENCOUNTER — Encounter: Payer: Self-pay | Admitting: *Deleted

## 2014-01-12 ENCOUNTER — Encounter: Payer: Self-pay | Admitting: Internal Medicine

## 2014-01-12 NOTE — Assessment & Plan Note (Signed)
Controlled on zestri and toprol

## 2014-01-12 NOTE — Assessment & Plan Note (Signed)
LDL-57;HDL -53 on lipitor 80 mg

## 2014-01-12 NOTE — Assessment & Plan Note (Signed)
Continue ranitidine BID

## 2014-01-12 NOTE — Assessment & Plan Note (Signed)
After extensive review pt high readings are not in a discernable pattern so will have to work with SSI - 150-300 -3 units, 301-450- 6 units. > 451 10 units

## 2014-01-12 NOTE — Assessment & Plan Note (Signed)
New baselinbe is 12.5 ;pt on iron

## 2014-01-12 NOTE — Assessment & Plan Note (Signed)
On trileptal and depakote

## 2014-02-01 ENCOUNTER — Other Ambulatory Visit (HOSPITAL_COMMUNITY): Payer: Self-pay | Admitting: Gastroenterology

## 2014-02-01 DIAGNOSIS — R1310 Dysphagia, unspecified: Secondary | ICD-10-CM

## 2014-02-09 ENCOUNTER — Ambulatory Visit (HOSPITAL_COMMUNITY)
Admission: RE | Admit: 2014-02-09 | Discharge: 2014-02-09 | Disposition: A | Discharge status: Home / Self Care | Payer: Medicare Other | Source: Ambulatory Visit | Attending: Gastroenterology | Admitting: Gastroenterology

## 2014-02-09 DIAGNOSIS — R1313 Dysphagia, pharyngeal phase: Secondary | ICD-10-CM | POA: Insufficient documentation

## 2014-02-09 DIAGNOSIS — E119 Type 2 diabetes mellitus without complications: Secondary | ICD-10-CM | POA: Insufficient documentation

## 2014-02-09 DIAGNOSIS — Z8673 Personal history of transient ischemic attack (TIA), and cerebral infarction without residual deficits: Secondary | ICD-10-CM | POA: Insufficient documentation

## 2014-02-09 DIAGNOSIS — K219 Gastro-esophageal reflux disease without esophagitis: Secondary | ICD-10-CM | POA: Insufficient documentation

## 2014-02-09 DIAGNOSIS — R05 Cough: Secondary | ICD-10-CM | POA: Diagnosis not present

## 2014-02-09 DIAGNOSIS — R059 Cough, unspecified: Secondary | ICD-10-CM | POA: Insufficient documentation

## 2014-02-09 DIAGNOSIS — R1310 Dysphagia, unspecified: Secondary | ICD-10-CM | POA: Diagnosis present

## 2014-02-09 NOTE — Procedures (Signed)
MBSS complete. Full report located under chart review in imaging section.  Eaton Folmar Paiewonsky, M.A. CCC-SLP [phone]  

## 2014-02-12 ENCOUNTER — Non-Acute Institutional Stay (SKILLED_NURSING_FACILITY): Payer: Medicare Other | Admitting: Internal Medicine

## 2014-02-12 DIAGNOSIS — E1159 Type 2 diabetes mellitus with other circulatory complications: Secondary | ICD-10-CM

## 2014-02-12 DIAGNOSIS — G4700 Insomnia, unspecified: Secondary | ICD-10-CM

## 2014-02-12 DIAGNOSIS — D638 Anemia in other chronic diseases classified elsewhere: Secondary | ICD-10-CM

## 2014-02-12 DIAGNOSIS — K219 Gastro-esophageal reflux disease without esophagitis: Secondary | ICD-10-CM

## 2014-02-12 DIAGNOSIS — F3111 Bipolar disorder, current episode manic without psychotic features, mild: Secondary | ICD-10-CM

## 2014-02-18 ENCOUNTER — Encounter: Payer: Self-pay | Admitting: Internal Medicine
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
## 2014-03-08 ENCOUNTER — Other Ambulatory Visit: Payer: Self-pay | Admitting: *Deleted

## 2014-03-08 MED ORDER — ZOLPIDEM TARTRATE 10 MG PO TABS: 10.0000 mg | ORAL_TABLET | Freq: Every evening | ORAL | Status: DC | PRN

## 2014-03-08 NOTE — Telephone Encounter (Signed)
[facility] 

## 2014-04-10 ENCOUNTER — Encounter: Payer: Self-pay | Admitting: Internal Medicine

## 2014-04-10 ENCOUNTER — Non-Acute Institutional Stay (SKILLED_NURSING_FACILITY): Payer: Medicare Other | Admitting: Internal Medicine

## 2014-04-10 DIAGNOSIS — E1165 Type 2 diabetes mellitus with hyperglycemia: Secondary | ICD-10-CM

## 2014-04-10 DIAGNOSIS — I2510 Atherosclerotic heart disease of native coronary artery without angina pectoris: Secondary | ICD-10-CM

## 2014-04-10 DIAGNOSIS — F3110 Bipolar disorder, current episode manic without psychotic features, unspecified: Secondary | ICD-10-CM

## 2014-04-10 DIAGNOSIS — G4700 Insomnia, unspecified: Secondary | ICD-10-CM

## 2014-04-10 DIAGNOSIS — I10 Essential (primary) hypertension: Secondary | ICD-10-CM

## 2014-04-10 DIAGNOSIS — K219 Gastro-esophageal reflux disease without esophagitis: Secondary | ICD-10-CM

## 2014-04-10 DIAGNOSIS — IMO0002 Reserved for concepts with insufficient information to code with codable children: Secondary | ICD-10-CM

## 2014-04-10 NOTE — Progress Notes (Signed)
Patient ID: Kyle Shireharles E Culbertson Jr., male   DOB: Dec 26, 1950, 63 y.o.   MRN: [id number]   This is a routine visit.  Level care skilled.  Facility [facility]. Chief complaintmedical management of chronic issues including diabetes type 2 coronary artery disease history CVA-bipolar disorder-hyperlipidemia-depression-history of hiatal hernia.  History of present illness.  Patient is a pleasant 63 year old male with the above diagnoses he has been fairly stable recently.  He does have a history of significant GERD with a history of hiatal hernia he is onDexilent as well as Zantac--this appears to be helping-it has been a significant issue in the past.  He is also diabetic-blood sugars appear to be somewhat variable ranging from 128-20 4 in the morning often mid 100s to 200 range.  Later in the day sugars are somewhat more elevated occasionally will have a reading in the mid 100s but largely this is 200+ later in the day.  Patient apparently does have frequent snacking which I suspect will count for a later in the day higher readings.  He also has a history of hypertension he is on lisinopril  As well as Toprol blood pressures appear to be stable most recently 130/83-112/73.  He does have a history of coronary artery disease and CVA he continues on aspirin as well as a statin.  In regards of bipolar disorder he continues on Trileptal and Depakote and this appears to be stable  Patient does have insomnia and is on Ambien 10 mg daily at bedtime when necessary-according to nursing staff this has been effective-however patient recently had a new roommate and this appears to be his main complaint.   chart states that he has trouble sleeping since he got the new roommate apparently there is some noise possibly snoring issue-there are also other issues involving air-conditioning that ismaking the patient uncomfortable as well  I have spoken to nursing about this and they will have social work readdress  this-patient does remain significantly concerned about this which I suspect is affecting his sleeping.  Family medical social history as been reviewed most recently progress note on 02/18/2014.  Medications have been reviewed per MAR.  Review of systems.  In general does not complain of any fever or chills.  Skin does not complain of itching or rashes.  Head ears eyes nose mouth and throat-does not complain of any visual changes or sore throat.  Respiratory no shortness of breath or cough.  Cardiac no chest pain or significant lower extremity edema.  GI does not complain of any abdominal issues tonight although he has had a history of this as noted above does not complain of abdominal pain nausea or vomiting.  Muscle skeletal is not complaining of any joint pain.  Neurologic does not complain of any syncope headache or dizziness.  In psych significant history here no overt anxiety sadness today-she does complain of insomnia as noted above.  Physical exam.  He is afebrile pulse of 63 respirations 18 blood pressure 130/83.  In general this is a pleasant elderly male in no distress sitting comfortably in his wheelchair.  His skin is warm and dry.  Oropharynx clear mucous membranes moist.  Eyes. Visual acuity appears grossly intact sclera and conjunctiva are clear.  Chest is clear to auscultation no labored breathing.  Heart regular rate and rhythm without murmur gallop or rub he does not really have significant lower extremity edema.  Abdomen soft nontender with positive bowel sounds. His somewhat protuberant which is not new.  Muscle skeletal E  largely up-to-date wheelchair moves all extremities at baseline I did not note any deformities.  Neurologic appears grossly intact cranial nerves intact to speech is clear.--  Psych he is alert and oriented pleasant and appropriate although he does appear to be concerned about his roommate situation and is somewhat distressed  about this.  Labs.  02/19/2014.  Cholesterol 78-HDL 30-LDL 41-triglycerides 35.  Valproic acid 25.6.  Hemoglobin A1c-8.2 which appears to show some gradual improvement.  Sodium 142 potassium 3.6 BUN 16 creatinine 0.9.  Liver function tests within normal limits.  WBC 6.8 hemoglobin 12.5 platelets 159.  Assessment and plan.  GERD-this appears to be stable he is on ranitidine as well as  Dexilant  #2-history of diabetes-somewhat challenging situation with patient snacking which he is aware of-will cautiously increase Lantus slightly since he appears to havesomewhat  elevated blood sugars in all day part although more so later in the day-will increase by 2 units of suspect we'll be able to go up a bit higher but would like to make sure we avoid hypoglycemia--  He also continues on Glucophage 1000 mg twice a day . #3hypertension this appears stable on an ACE and beta blocker recent blood pressures 130/83-112/73-122/70.  #4-history of anemia-he is on iron-hemoglobin recently appears to be at baseline.  #5-history of bipolar disorder he is on Trileptal and Depakote this appears to be stable--he also continues on Cymbalta with history of depression as well as lorazepam when necessary for anxiety.  #6-history hyperlipidemia this appears stable per recent lipid panel he is on a statin.  #7 history of insomnia-there are complicating factors as noted above-did have fairly extensive discussion with the patient-would be hesitant to change his insomnia medication until the social situation stabilizes-I suspect this is contributing more to his insomnia-this was the opinion when I spoke with nursing saying he did well with the Ambien before the roommate situation-at this point will monitor with follow-up by social services about this.  #8-history of anxiety he is on clonazepam twice a day as needed this is been stable.  #9 history of coronary artery disease this is been relatively asymptomatic  he is on aspirin as well as a statin-  #10 history of dementia this appears to be quite mild he is on Aricept he continues to be pleasant and appropriate  #11-history of hydrocephalus-thought not to be a surgical candidate since he has not responded to high volume lumbar puncture as an outpatient-again his mental status appears to have been quite stable  for some time.  #12-history CVA-again he is on aspirin and a statin he appears to have adjusted quite well to the facility -- with the complicating factor of his roommate situation currently  [id number]-of note greater than 35 minutes spent assessing patient-discussing his concerns at bedside-and coordinating and formulating a plan of care for numerous diagnoses-of note greater than 50% of time spent coordinating plan of care    .

## 2014-06-05 ENCOUNTER — Encounter: Payer: Self-pay | Admitting: Internal Medicine

## 2014-06-05 ENCOUNTER — Non-Acute Institutional Stay (SKILLED_NURSING_FACILITY): Payer: Medicare Other | Admitting: Internal Medicine

## 2014-06-05 DIAGNOSIS — F319 Bipolar disorder, unspecified: Secondary | ICD-10-CM

## 2014-06-05 DIAGNOSIS — E119 Type 2 diabetes mellitus without complications: Secondary | ICD-10-CM

## 2014-06-05 DIAGNOSIS — I2510 Atherosclerotic heart disease of native coronary artery without angina pectoris: Secondary | ICD-10-CM

## 2014-06-05 DIAGNOSIS — K219 Gastro-esophageal reflux disease without esophagitis: Secondary | ICD-10-CM

## 2014-06-05 DIAGNOSIS — H6120 Impacted cerumen, unspecified ear: Secondary | ICD-10-CM

## 2014-06-05 NOTE — Progress Notes (Signed)
Patient ID: Kyle Shireharles E Marchetta Jr., male   DOB: 04/21/1951, 64 y.o.   MRN: [id number]   . This is a routine visit  Level care skilled.  Facility [facility]  . Chief complaintmedical management of chronic issues including diabetes type 2 coronary artery disease history CVA-bipolar disorder-hyperlipidemia-depression-history of hiatal hernia.  History of present illness.  Patient is a pleasant 64 year old male with the above diagnoses he has been fairly stable recently.  He does have a history of significant GERD with a history of hiatal hernia he is onDexilent as well as Zantac--this appears to be helping-it has been a significant issue in the past.  He is also diabetic-blood sugars continue to variable ranging from 67-low 200's in the morning -averaging it appears more in the mid 100 range-I do not see consistent readings below 100  Later in the day sugars are somewhat more elevated I would say averaging in the higher 100 range with some readings at times above 200-again there is quite a bit of variability here.  Patient apparently does have frequent snacking which I suspect will count for a later in the day higher readings.  Hemoglobin A1c in September was 8.2  He also has a history of hypertension he is on lisinopril  As well as Toprol blood pressures appear to be stable most recently 114/71-109/69-126/79.  He does have a history of coronary artery disease and CVA he continues on aspirin as well as a statin.  In regards of bipolar disorder he continues on Trileptal and Depakote and this appears to be stable  Patient does have insomnia and is on Ambien 10 mg daily at bedtime when necessary  He does not complain of insomnia currently-when I last saw him apparently there were issues with his roommate which affected his sleeping but he did not mention that today.  Patient's main concern today is he has a full feeling in his ears feels they are plugged-   Family medical social history  as been reviewed most recently progress note on 04/10/2014.  Medications have been reviewed per MAR.  Review of systems.  In general does not complain of any fever or chills.  Skin does not complain of itching or rashes.  Head ears eyes nose mouth and throat-does not complain of any visual changes or sore throat.--Says his ears feel plugged does not complaining of any ear pain Does not complain of any nasal drainage or stuffiness  Respiratory no shortness of breath or cough.  Cardiac no chest pain or significant lower extremity edema.  GI does not complain of any abdominal issues tonight although he has had a history of this as noted above does not complain of abdominal pain nausea or vomiting.  Muscle skeletal is not complaining of any joint pain.  Neurologic does not complain of any syncope headache or dizziness .  psych significant history here no overt anxiety sadness today-  Physical exam.  Temperature 97.1 pulse 60 respirations 18 blood pressure 114/71  In general this is a pleasant elderly male in no distress sitting comfortably in his wheelchair.  His skin is warm and dry.  Oropharynx clear mucous membranes moist.  Eyes. Visual acuity appears grossly intact sclera and conjunctiva are clear  Ears-he has visible wax occluding both  ear canals-I do not see any drainage or redness.  Chest is clear to auscultation no labored breathing.  Heart regular rate and rhythm without murmur gallop or rub he does not really have significant lower extremity edema.  Abdomen soft  nontender with positive bowel sounds.  somewhat protuberant which is not new.  Muscle skeletal --ambulates wheelchair moves all extremities at baseline I did not note any deformities.  Neurologic appears grossly intact cranial nerves intact to speech is clear.--  Psych he is alert and oriented pleasant and appropriate .  Labs.  02/19/2014.  Cholesterol 78-HDL 30-LDL 41-triglycerides  35.  Valproic acid 25.6.  Hemoglobin A1c-8.2 which appears to show some gradual improvement.  Sodium 142 potassium 3.6 BUN 16 creatinine 0.9.  Liver function tests within normal limits.  WBC 6.8 hemoglobin 12.5 platelets 159.  Assessment and plan.  GERD-this appears to be stable he is on ranitidine as well as  Dexilant  #2-history of diabetes-somewhat challenging situation with patient snacking which he is aware of-Lantus was increased several months ago this appears to have a somewhat beneficial effect-would like to see where we stand hemoglobin A1c wise -  He also continues on Glucophage 1000 mg twice a day . #3hypertension this appears stable on an ACE and beta blocke  #4-history of anemia-he is on iron-hemoglobin recently appears to be at baseline Will update this.  #5-history of bipolar disorder he is on Trileptal and Depakote this appears to be stable--he also continues on Cymbalta with history of depression as well as lorazepam when necessary for anxiety--will update Depakote level.  #6-history hyperlipidemia this appears stable per recent lipid panel he is on a statin.  #7 history of insomnia-patient does not complain of insomnia today which is encouraging at this point will monitor   #8-history of anxiety he is on clonazepam twice a day as needed this is been stable.  #9 history of coronary artery disease this is been relatively asymptomatic he is on aspirin as well as a statin-  #10 history of dementia this appears to be quite mild he is on Aricept he continues to be pleasant and appropriate  #11-history of hydrocephalus-thought not to be a surgical candidate since he has not responded to high volume lumbar puncture as an outpatient-again his mental status appears to have been quite stable  for some time.  #12-history CVA-again he is on aspirin and a statin he appears to have adjusted quite well to the facility -- with the complicating factor of his roommate  situation currently  #13-history of cerumen impaction-will treat with the Debrox10 drops each ear 3 times a day for 3 days flush with warm water on day 4-considering the extent of what appears to be a significant wax buildup this may have to repeat repeated if not effective consider ENT consult  [phone]     .

## 2014-08-29 ENCOUNTER — Non-Acute Institutional Stay (SKILLED_NURSING_FACILITY): Payer: Medicare Other | Admitting: Internal Medicine

## 2014-08-29 ENCOUNTER — Encounter: Payer: Self-pay | Admitting: Internal Medicine

## 2014-08-29 DIAGNOSIS — F419 Anxiety disorder, unspecified: Secondary | ICD-10-CM | POA: Diagnosis not present

## 2014-08-29 DIAGNOSIS — F319 Bipolar disorder, unspecified: Secondary | ICD-10-CM

## 2014-08-29 DIAGNOSIS — G4700 Insomnia, unspecified: Secondary | ICD-10-CM

## 2014-08-29 DIAGNOSIS — F329 Major depressive disorder, single episode, unspecified: Secondary | ICD-10-CM | POA: Diagnosis not present

## 2014-08-29 DIAGNOSIS — F0390 Unspecified dementia without behavioral disturbance: Secondary | ICD-10-CM

## 2014-08-29 DIAGNOSIS — F32A Depression, unspecified: Secondary | ICD-10-CM | POA: Insufficient documentation

## 2014-08-29 NOTE — Assessment & Plan Note (Signed)
Sustained on Klonopin 1 mg BID

## 2014-08-29 NOTE — Assessment & Plan Note (Signed)
On aricept;Plan- continue aricept 10 mg qHS

## 2014-08-29 NOTE — Assessment & Plan Note (Signed)
Controlled with trileptil and depakote;Plan- continue current meds, depakote has changed from 250 BID to 500 mg qHS

## 2014-08-29 NOTE — Assessment & Plan Note (Signed)
Trazodone 50 mg qHS;Plan -cont current med

## 2014-08-29 NOTE — Assessment & Plan Note (Signed)
Pt on lexapro 15 mg
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
## 2014-10-08 ENCOUNTER — Non-Acute Institutional Stay (SKILLED_NURSING_FACILITY): Payer: Medicare Other | Admitting: Internal Medicine

## 2014-10-08 ENCOUNTER — Encounter: Payer: Self-pay | Admitting: Internal Medicine

## 2014-10-08 DIAGNOSIS — F0390 Unspecified dementia without behavioral disturbance: Secondary | ICD-10-CM

## 2014-10-08 DIAGNOSIS — K219 Gastro-esophageal reflux disease without esophagitis: Secondary | ICD-10-CM

## 2014-10-08 DIAGNOSIS — E119 Type 2 diabetes mellitus without complications: Secondary | ICD-10-CM

## 2014-10-08 DIAGNOSIS — G4700 Insomnia, unspecified: Secondary | ICD-10-CM

## 2014-10-08 DIAGNOSIS — I10 Essential (primary) hypertension: Secondary | ICD-10-CM | POA: Diagnosis not present

## 2014-10-08 NOTE — Progress Notes (Signed)
Patient ID: Kyle Shireharles E Goodwine Jr., male   DOB: 22-Aug-1950, 64 y.o.   MRN: [id number]         . This is a routine visit  Level care skilled.  Facility [facility]  . Chief complaintmedical management of chronic issues including diabetes type 2 coronary artery disease history CVA-bipolar disorder-hyperlipidemia-depression-history of hiatal hernia.  History of present illness.  Patient is a pleasant 64 year old male with the above diagnoses he has been fairly stable recently.  He does have a history of significant GERD with a history of hiatal hernia he is onDexilent as well as Zantac--this appears to be helping-it has been a significant issue in the past.  He is also diabetic-he is on Lantus 27 units daily as well as a sliding scale and Glucophage thousand milligrams twice a day Morning sugars vary from 1 16-212 appears to be fairly well controlled however there is a lot of variability later in the day at noon I see mid 100s to occasionally about 300 again this is quite variable.  At 4 PM 112-294 appears to be the recent range he is often in the 2 300 range at at bedtime but appears to come down fairly significantly overnight  Hemoglobin A1c in January was 7.6 which actually is in improvement from the previous level-we will need to update this and possibly make some medication changes but would like to see where we stand long-term .  Patient apparently does have frequent snacking which I suspect will count for a later in the day higher readings.  Hemoglobin A1c in September was 8.2  He also has a history of hypertension he is on lisinopril  As well as Toprol blood pressures appear to be stable most recently   140/72-139/79-124/78  He does have a history of coronary artery disease and CVA he continues on aspirin as well as a statin.  In regards of bipolar disorder he continues on Trileptal and Depakote and this appears to be stable  Patient does have insomnia and is on Ambien 10 mg  daily at bedtime when necessary   He is complaining somewhat chronic insomnia he is receiving trazodone 50 mg daily at bedtime -   Family medical social history as been reviewed including most recent progress note 08/29/2014  [facility]  Review of systems.  In general does not complain of any fever or chills.  Skin does not complain of itching or rashes.  Head ears eyes nose mouth and throat-does not complain of any visual changes or sore throat.-- Does not complain of any nasal drainage or stuffiness  Respiratory no shortness of breath or cough.  Cardiac no chest pain or significant lower extremity edema.  GI does not complain of any abdominal issues tonight although he has had a history of this as noted above does not complain of abdominal pain nausea or vomiting.  Muscle skeletal is not complaining of any joint pain.  Neurologic does not complain of any syncope headache or dizziness .  psych significant history here no overt anxiety sadness today-complains of somewhat persistent insomnia  Physical exam.  He is afebrile pulse of 56 respirations 20 blood pressure 140/52-124/78 most recently in this range  In general this is a pleasant elderly male in no distress sitting comfortably in his recliner.  His skin is warm and dry.  Oropharynx clear mucous membranes moist.  Eyes. Visual acuity appears grossly intact sclera and conjunctiva are clear .  Chest is clear to auscultation no  labored breathing.  Heart regular rate and rhythm without murmur gallop or rub he does not really have significant lower extremity edema.  Abdomen soft nontender with positive bowel sounds.  somewhat protuberant which is not new.  Muscle skeletal --ambulates wheelchair moves all extremities at baseline I did not note any deformities.  Neurologic appears grossly intact cranial nerves intact to speech is clear.--  Psych he is alert and oriented pleasant and  appropriate .  Labs  06/14/2014.  The WB C4.8 hemoglobin 12.5 platelets 150.  Hemoglobin A1c-7.6.  Sodium 138 potassium 4.1 BUN 14 creatinine 0.9.  Liver function tests within normal limits.  06/06/2014.  Depakote level XXXIV.4.  .  02/19/2014.  Cholesterol 78-HDL 30-LDL 41-triglycerides 35.  Valproic acid 25.6.  Hemoglobin A1c-8.2 which appears to show some gradual improvement.  Sodium 142 potassium 3.6 BUN 16 creatinine 0.9.  Liver function tests within normal limits.  WBC 6.8 hemoglobin 12.5 platelets 159.  Assessment and plan.  GERD-this appears to be stable he is on ranitidine as well as  Dexilant  #2-history of diabetes-somewhat challenging situation with patient snacking which he is aware of It appears Lantus was recently increased to 27 units daily.  Still has quite variable blood sugars--will check a hemoglobin A1c to see how he has been doing more long-term . #3hypertension this appears stable on an ACE and beta blocke  #4-history of anemia-he is on iron-hemoglobin recently appears to be at baseline Will update this.  #5-history of bipolar disorder he is on Trileptal and Depakote this appears to be stable-- as well as lorazepam when necessary for anxiety--will update Depakote level. Per chart review it appears  Trileptal has been decreased to every morning  #6-history hyperlipidemia this appears stable ont lipid panel--last September he is on a statin. We'll update this--  #7 history of insomnia-on trazodone 50 mg daily at bedtime-says he still has insomnia--consider increasing or changing med...but will discuss severity of this with regular PM nurse when she  Is  available--   #8-history of anxiety he is on clonazepam twice a day as needed this is been stable.  #9 history of coronary artery disease this is been relatively asymptomatic he is on aspirin as well as a statin-  #10 history of dementia this appears to be quite mild he is on Aricept he  continues to be pleasant and appropriate  #11-history of hydrocephalus-thought not to be a surgical candidate since he has not responded to high volume lumbar puncture as an outpatient-again his mental status appears to have been quite stable  for some time.  #12-history CVA-again he is on aspirin and a statin he appears to have adjusted quite well to the facility --   [id number]--of note greater than 35 minutes spent assessing patient-reviewing his chart-and coordinating and formulating a plan of care for numerous diagnoses-of note greater than 50% of time spent coordinating plan of care     .

## 2014-10-09 ENCOUNTER — Encounter: Payer: Self-pay | Admitting: *Deleted

## 2014-10-23 ENCOUNTER — Encounter: Payer: Self-pay | Admitting: Internal Medicine

## 2014-10-23 ENCOUNTER — Non-Acute Institutional Stay (SKILLED_NURSING_FACILITY): Payer: Medicare Other | Admitting: Internal Medicine

## 2014-10-23 DIAGNOSIS — M79601 Pain in right arm: Secondary | ICD-10-CM | POA: Diagnosis not present

## 2014-10-23 DIAGNOSIS — M79604 Pain in right leg: Secondary | ICD-10-CM
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
## 2014-12-05 ENCOUNTER — Ambulatory Visit (INDEPENDENT_AMBULATORY_CARE_PROVIDER_SITE_OTHER): Payer: Medicare Other | Admitting: Internal Medicine

## 2014-12-05 ENCOUNTER — Encounter: Payer: Self-pay | Admitting: Internal Medicine

## 2014-12-05 VITALS — BP 118/66 | HR 57 | Ht 68.0 in | Wt 180.0 lb

## 2014-12-05 DIAGNOSIS — R001 Bradycardia, unspecified: Secondary | ICD-10-CM | POA: Diagnosis not present

## 2014-12-05 DIAGNOSIS — F0390 Unspecified dementia without behavioral disturbance: Secondary | ICD-10-CM | POA: Diagnosis not present

## 2014-12-05 DIAGNOSIS — I2510 Atherosclerotic heart disease of native coronary artery without angina pectoris: Secondary | ICD-10-CM

## 2014-12-05 DIAGNOSIS — E119 Type 2 diabetes mellitus without complications: Secondary | ICD-10-CM

## 2014-12-05 DIAGNOSIS — I10 Essential (primary) hypertension: Secondary | ICD-10-CM

## 2014-12-05 DIAGNOSIS — I2583 Coronary atherosclerosis due to lipid rich plaque: Principal | ICD-10-CM

## 2014-12-05 NOTE — Patient Instructions (Signed)
Your physician recommends that you return for lab work at your earliest convenience FASTING.  Dr Hilty recommends that you schedule a follow-up appointment in 1 year. You will receive a reminder letter in the mail two months in advance. If you don't receive a letter, please call our office to schedule the follow-up appointment. 
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
## 2014-12-17 ENCOUNTER — Ambulatory Visit: Payer: PRIVATE HEALTH INSURANCE | Admitting: Internal Medicine

## 2015-01-07 ENCOUNTER — Encounter: Payer: Self-pay | Admitting: Internal Medicine

## 2015-01-07 ENCOUNTER — Non-Acute Institutional Stay (SKILLED_NURSING_FACILITY): Payer: Medicare Other | Admitting: Internal Medicine

## 2015-01-07 DIAGNOSIS — F0390 Unspecified dementia without behavioral disturbance: Secondary | ICD-10-CM

## 2015-01-07 DIAGNOSIS — I2510 Atherosclerotic heart disease of native coronary artery without angina pectoris: Secondary | ICD-10-CM | POA: Diagnosis not present

## 2015-01-07 DIAGNOSIS — I639 Cerebral infarction, unspecified: Secondary | ICD-10-CM | POA: Diagnosis not present

## 2015-01-07 DIAGNOSIS — F319 Bipolar disorder, unspecified: Secondary | ICD-10-CM | POA: Diagnosis not present

## 2015-01-07 DIAGNOSIS — G4700 Insomnia, unspecified: Secondary | ICD-10-CM

## 2015-01-07 DIAGNOSIS — E119 Type 2 diabetes mellitus without complications: Secondary | ICD-10-CM

## 2015-01-07 DIAGNOSIS — I10 Essential (primary) hypertension: Secondary | ICD-10-CM

## 2015-01-07 NOTE — Progress Notes (Signed)
Patient ID: Kyle [name]., male   DOB: 06/03/1950, 64 y.o.   MRN: [id number]          . This is a routine visit  Level care skilled.  Facility [facility]  . Chief complaintmedical management of chronic issues including diabetes type 2 coronary artery disease history CVA-bipolar disorder-hyperlipidemia-depression-history of hiatal hernia.--insomnia  History of present illness.  Patient is a pleasant 64 year old male with the above diagnoses he has been fairly stable recently.  He does have a history of significant GERD with a history of hiatal hernia he is onDexilent as well as Zantac--this appears to be helping-it has been a significant issue in the past.  He is also diabetic-he is on Lantus 27 units daily as well as a sliding scale and Glucophage thousand milligrams twice a day  Blood sugars appear to be fairly well controlled in the morning largely lower to mid 100s later in the day continues to have quite a bit of variability ranging from the mid 100s up to 300 at times especially in the later day part-apparently he doeseat multiple snakckst frequently later in the day which I suspect may be leading to this-nonetheless his hemoglobin A1c appears to have improved over the last year-currently 7.7 we will update this again this has been somewhat challenging with his variable blood sugars and snacking later in the day .    Hemoglobin A1c in September  Last year was 8.2  He also has a history of hypertension he is on lisinopril  As well as Toprol blood pressures appear to be stable most recently-118/70-135/84-the highest one I see is 154/87 that does not appear to be consistent--would say his average systolic is in the 130s  He does have a history of coronary artery disease and CVA he continues on aspirin as well as a statin.--He did see a cardiologist last month and thought to be stable apparently there were some EKG changes that showed anterior T-wave inversion that has  worsened slightly-this could be due to coronary ischemia was thought to be non-urgent bycardiologyt since patient is not very active.  I do note he did have a cardiac echo last year per cardiology that showed normal systolic function and some mild diastolic failure  In regards of bipolar disorder he continues on Trileptal and Depakote and this appears to be stable  He does continue to complain of insomnia and has been on Ambien in the past is on trazodone currently 50 mg daily at bedtime says he still has trouble sleeping at night -   Family medical social history as been reviewed including most recent notes including May 16 and 10/23/2014 and cardiology note 12/05/2014  Medications have been reviewed per [facility]. Include.  Lantus 27 units daily at bedtime.  Tramadol 50 mg every 8 hours when necessary.  Norco 5-3 25 mg every 4 hours when necessary.  Clonazepam 1 mg twice a day.  Aspirin 81 mg daily.  Lisinopril 10 mg daily.  Trileptal 600 mg daily.  Iron 324 mg daily.  Lexapro 15 mg daily.  Aricept 10 mg daily.  Atorvastatin 80 mg daily.  NovoLog sliding scale with meals.  Trazodone 50 mg daily at bedtime.  Desilant 60 mg daily.  Zantac 150 mg twice a day.  Metoprolol 25 mg twice a day.  Depakote 2050 mg twice a day.  Glucophage thousand milligrams twice a day.    Review of systems.  In general does not complain of any fever or chills.  Skin  does not complain of itching or rashes.  Head ears eyes nose mouth and throat-does not complain of any visual changes or sore throat.-- Does not complain of any nasal drainage or stuffiness  Respiratory no shortness of breath or cough.  Cardiac no chest pain or significant lower extremity edema.  GI does not complain of any abdominal issues tonight although he has had a history of this as noted above does not complain of abdominal pain nausea or vomiting.  Muscle skeletal is not complaining of any joint  pain.  Neurologic does not complain of any syncope headache or dizziness .  psych significant history here no overt anxiety sadness today-complains of somewhat persistent insomnia--just been a frequent complaint here on recent visits  Physical exam.  He is afebrile pulse 56 respirations 18 blood pressure 118/70  In general this is a pleasant elderly male in no distress sitting comfortably in his wheelchair.  His skin is warm and dry.  Oropharynx clear mucous membranes moist.  Eyes. Visual acuity appears grossly intact sclera and conjunctiva are clear .  Chest is clear to auscultation no labored breathing.  Heart regular rate and rhythm--lightly bradycardic- without murmur gallop or rub he does not really have significant lower extremity edema.  Abdomen soft nontender with positive bowel sounds.  somewhat protuberant which is not new.  Muscle skeletal --ambulates wheelchair moves all extremities at baseline I did not note any deformities.  Neurologic appears grossly intact cranial nerves intact to speech is clear.--  Psych he is alert and oriented pleasant and appropriate .  Labs  10/10/2014.  Hemoglobin A1c 7.7.  Sodium 141 potassium 4.2 BUN 18 creatinine 1.04-liver function tests within normal limits albumin was 3.66.  WBC 7.4 hemoglobin 13.4 platelets 167.  Cholesterol 102 HDL 31-LDL 53-triglycerides 91  06/14/2014.  The WB C4.8 hemoglobin 12.5 platelets 150.  Hemoglobin A1c-7.6.  Sodium 138 potassium 4.1 BUN 14 creatinine 0.9.  Liver function tests within normal limits.  06/06/2014.  Depakote level XXXIV.4.  .  02/19/2014.  Cholesterol 78-HDL 30-LDL 41-triglycerides 35.  Valproic acid 25.6.  Hemoglobin A1c-8.2 which appears to show some gradual improvement.  Sodium 142 potassium 3.6 BUN 16 creatinine 0.9.  Liver function tests within normal limits.  WBC 6.8 hemoglobin 12.5 platelets 159.  Assessment and plan.  GERD-this appears to be  stable he is on ranitidine as well as  Dexilant  #2-history of diabetes-somewhat challenging situation with patient snacking which he is aware of Lantus was increased several months ago-will update a hemoglobin A1c generally it appears to be trending down again he has quite a bit of variability which complicates matters    . #3hypertension this appears stable on an ACE and beta blocker--I do see somewhat frequent readings in the 50s but he has been asymptomatic at this point monitor  #4-history of anemia-he is on iron-hemoglobin recently appears to be at baseline Will update this.  #5-history of bipolar disorder he is on Trileptal and Depakote this appears to be stable-- as well as lorazepam when necessary for anxiety--will update Depakote level. Per chart review it appears  Trileptal has been decreased to every morning  #6-history hyperlipidemia this appears stable ont lipid panel--last September he is on a statin. We'll update this--  #7 history of insomnia-on trazodone 50 mg daily at bedtime-says he still has insomnia- Will start low-dose melatonin and monitor this was discussed with patient--   #8-history of anxiety he is on clonazepam twice a day as needed this is been stable.  #  9 history of coronary artery disease this is been relatively asymptomatic he is on aspirin as well as a statin--- seen by cardiolog last month  #10 history of dementia this appears to be quite mild he is on Aricept he continues to be pleasant and appropriate  #11-history of hydrocephalus-thought not to be a surgical candidate since he has not responded to high volume lumbar puncture as an outpatient-again his mental status appears to have been quite stable  for some time.  #12-history CVA-again he is on aspirin and a statin he appears to have adjusted quite well to the facility --   [id number]--of note greater than 35 minutes spent assessing patient-reviewing his chart-and coordinating and formulating a  plan of care for numerous diagnoses-of note greater than 50% of time spent coordinating plan of careWith review of his chart and consult notes     .

## 2015-02-26 ENCOUNTER — Encounter: Payer: Self-pay | Admitting: Emergency Medicine

## 2015-03-30 ENCOUNTER — Encounter (HOSPITAL_COMMUNITY): Payer: Self-pay | Admitting: Emergency Medicine

## 2015-03-30 ENCOUNTER — Emergency Department (HOSPITAL_COMMUNITY)
Admission: EM | Admit: 2015-03-30 | Discharge: 2015-03-30 | Disposition: A | Discharge status: Home / Self Care | Payer: Medicare Other | Attending: Emergency Medicine | Admitting: Emergency Medicine

## 2015-03-30 ENCOUNTER — Non-Acute Institutional Stay (SKILLED_NURSING_FACILITY): Payer: Medicare Other | Admitting: Internal Medicine

## 2015-03-30 DIAGNOSIS — Z8673 Personal history of transient ischemic attack (TIA), and cerebral infarction without residual deficits: Secondary | ICD-10-CM | POA: Diagnosis not present

## 2015-03-30 DIAGNOSIS — Z79899 Other long term (current) drug therapy: Secondary | ICD-10-CM | POA: Insufficient documentation

## 2015-03-30 DIAGNOSIS — R269 Unspecified abnormalities of gait and mobility: Secondary | ICD-10-CM

## 2015-03-30 DIAGNOSIS — Y998 Other external cause status: Secondary | ICD-10-CM | POA: Diagnosis not present

## 2015-03-30 DIAGNOSIS — T148 Other injury of unspecified body region: Secondary | ICD-10-CM

## 2015-03-30 DIAGNOSIS — J45909 Unspecified asthma, uncomplicated: Secondary | ICD-10-CM | POA: Diagnosis not present

## 2015-03-30 DIAGNOSIS — F0390 Unspecified dementia without behavioral disturbance: Secondary | ICD-10-CM | POA: Diagnosis not present

## 2015-03-30 DIAGNOSIS — F319 Bipolar disorder, unspecified: Secondary | ICD-10-CM | POA: Insufficient documentation

## 2015-03-30 DIAGNOSIS — E119 Type 2 diabetes mellitus without complications: Secondary | ICD-10-CM | POA: Diagnosis not present

## 2015-03-30 DIAGNOSIS — I2510 Atherosclerotic heart disease of native coronary artery without angina pectoris: Secondary | ICD-10-CM | POA: Insufficient documentation

## 2015-03-30 DIAGNOSIS — Z7982 Long term (current) use of aspirin: Secondary | ICD-10-CM | POA: Diagnosis not present

## 2015-03-30 DIAGNOSIS — Y9389 Activity, other specified: Secondary | ICD-10-CM | POA: Diagnosis not present

## 2015-03-30 DIAGNOSIS — Y92129 Unspecified place in nursing home as the place of occurrence of the external cause: Secondary | ICD-10-CM | POA: Insufficient documentation

## 2015-03-30 DIAGNOSIS — Z794 Long term (current) use of insulin: Secondary | ICD-10-CM | POA: Insufficient documentation

## 2015-03-30 DIAGNOSIS — T148XXA Other injury of unspecified body region, initial encounter: Secondary | ICD-10-CM

## 2015-03-30 DIAGNOSIS — S0990XA Unspecified injury of head, initial encounter: Secondary | ICD-10-CM

## 2015-03-30 DIAGNOSIS — F419 Anxiety disorder, unspecified: Secondary | ICD-10-CM | POA: Diagnosis not present

## 2015-03-30 DIAGNOSIS — W1839XA Other fall on same level, initial encounter: Secondary | ICD-10-CM | POA: Diagnosis not present

## 2015-03-30 DIAGNOSIS — E782 Mixed hyperlipidemia: Secondary | ICD-10-CM | POA: Diagnosis not present

## 2015-03-30 DIAGNOSIS — S0083XA Contusion of other part of head, initial encounter: Secondary | ICD-10-CM | POA: Insufficient documentation

## 2015-03-30 DIAGNOSIS — W19XXXA Unspecified fall, initial encounter: Secondary | ICD-10-CM

## 2015-03-30 NOTE — Discharge Instructions (Signed)
Fall Prevention in the Home  Falls can cause injuries and can affect people from all age groups. There are many simple things that you can do to make your home safe and to help prevent falls. WHAT CAN I DO ON THE OUTSIDE OF MY HOME?  Regularly repair the edges of walkways and driveways and fix any cracks.  Remove high doorway thresholds.  Trim any shrubbery on the main path into your home.  Use bright outdoor lighting.  Clear walkways of debris and clutter, including tools and rocks.  Regularly check that handrails are securely fastened and in good repair. Both sides of any steps should have handrails.  Install guardrails along the edges of any raised decks or porches.  Have leaves, snow, and ice cleared regularly.  Use sand or salt on walkways during winter months.  In the garage, clean up any spills right away, including grease or oil spills. WHAT CAN I DO IN THE BATHROOM?  Use night lights.  Install grab bars by the toilet and in the tub and shower. Do not use towel bars as grab bars.  Use non-skid mats or decals on the floor of the tub or shower.  If you need to sit down while you are in the shower, use a plastic, non-slip stool..  Keep the floor dry. Immediately clean up any water that spills on the floor.  Remove soap buildup in the tub or shower on a regular basis.  Attach bath mats securely with double-sided non-slip rug tape.  Remove throw rugs and other tripping hazards from the floor. WHAT CAN I DO IN THE BEDROOM?  Use night lights.  Make sure that a bedside light is easy to reach.  Do not use oversized bedding that drapes onto the floor.  Have a firm chair that has side arms to use for getting dressed.  Remove throw rugs and other tripping hazards from the floor. WHAT CAN I DO IN THE KITCHEN?   Clean up any spills right away.  Avoid walking on wet floors.  Place frequently used items in easy-to-reach places.  If you need to reach for something  above you, use a sturdy step stool that has a grab bar.  Keep electrical cables out of the way.  Do not use floor polish or wax that makes floors slippery. If you have to use wax, make sure that it is non-skid floor wax.  Remove throw rugs and other tripping hazards from the floor. WHAT CAN I DO IN THE STAIRWAYS?  Do not leave any items on the stairs.  Make sure that there are handrails on both sides of the stairs. Fix handrails that are broken or loose. Make sure that handrails are as long as the stairways.  Check any carpeting to make sure that it is firmly attached to the stairs. Fix any carpet that is loose or worn.  Avoid having throw rugs at the top or bottom of stairways, or secure the rugs with carpet tape to prevent them from moving.  Make sure that you have a light switch at the top of the stairs and the bottom of the stairs. If you do not have them, have them installed. WHAT ARE SOME OTHER FALL PREVENTION TIPS?  Wear closed-toe shoes that fit well and support your feet. Wear shoes that have rubber soles or low heels.  When you use a stepladder, make sure that it is completely opened and that the sides are firmly locked. Have someone hold the ladder while you   are using it. Do not climb a closed stepladder.  Add color or contrast paint or tape to grab bars and handrails in your home. Place contrasting color strips on the first and last steps.  Use mobility aids as needed, such as canes, walkers, scooters, and crutches.  Turn on lights if it is dark. Replace any light bulbs that burn out.  Set up furniture so that there are clear paths. Keep the furniture in the same spot.  Fix any uneven floor surfaces.  Choose a carpet design that does not hide the edge of steps of a stairway.  Be aware of any and all pets.  Review your medicines with your healthcare provider. Some medicines can cause dizziness or changes in blood pressure, which increase your risk of falling. Talk  with your health care provider about other ways that you can decrease your risk of falls. This may include working with a physical therapist or trainer to improve your strength, balance, and endurance.   This information is not intended to replace advice given to you by your health care provider. Make sure you discuss any questions you have with your health care provider.   Document Released: 05/01/2002 Document Revised: 09/25/2014 Document Reviewed: 06/15/2014 Elsevier Interactive Patient Education 2016 [employer]  

## 2015-03-30 NOTE — ED Notes (Signed)
Resting on stretcher at this time.  Denies having any pain.  Fall risk bracelet applied.  Educated on use of call bell.  Verbalizes understanding.  Call bell within reach.

## 2015-03-30 NOTE — ED Notes (Signed)
2nd time attempting to call [facility] with [facility] answer. [facility] has taken pt.

## 2015-03-30 NOTE — ED Notes (Signed)
Discharged back to facility.  Waiting for [facility] at this time.  [facility].  On hold for 5 minutes or more.

## 2015-03-30 NOTE — ED Notes (Signed)
Br[facility] by [facility] for a fall from a sitting position hit the floor.  Bruising and swelling noted to right temple.  No other injuries noted.  Checked out at the facility by physician felt patient was okay but per policy had to send patient out for evaluation.

## 2015-03-30 NOTE — Progress Notes (Signed)
Patient ID: Kyle [name]., male   DOB: [birth year], 64 y.o.   MRN: [id number]           . This is an acute  visit  Level care skilled.  Facility [facility]  . Chief complaint--acute visit status post fall with forehead hematoma   History of present illness.  Patient is a pleasant 64 year old male with a history of coronary artery disease CVA diabetes 2 and bipolar disorder.  He has been quite stable however apparently this evening he got out of his chair without asking for help and apparently fell on his head-he's not really complaining of any headache currently he has sustained a hematoma to his right fore head area he also has an abrasion under his right knee  He denies any dizziness syncopal-type feelings chest pain shortness of breath or visual changes before during or after his fall  .    -   Family medical social history has been reviewed --including progress note 01/07/2015   Medications have been reviewed per [facility]. Include.  Lantus 27 units daily at bedtime.  Tramadol 50 mg every 8 hours when necessary.  Norco 5-3 25 mg every 4 hours when necessary.  Clonazepam 1 mg twice a day.  Aspirin 81 mg daily.  Lisinopril 10 mg daily.  Trileptal 600 mg daily.  Iron 324 mg daily.  Lexapro 15 mg daily.  Aricept 10 mg daily.  Atorvastatin 80 mg daily.  NovoLog sliding scale with meals.  Trazodone 50 mg daily at bedtime.  Desilant 60 mg daily.  Zantac 150 mg twice a day.  Metoprolol 25 mg twice a day.  Depakote 2050 mg twice a day.  Glucophage thousand milligrams twice a day.    Review of systems.  In general does not complain of any fever or chills.  Skin has sustained a hematoma and abrasions as noted above.  Head ears eyes nose mouth and throat-does not complain of any visual changes or sore throat.--   Respiratory no shortness of breath or cough.  Cardiac no chest pain or significant lower extremity edema.  GI does not  complain of any abdominal issues tonight although he has had a history of this as noted above does not complain of abdominal pain nausea or vomiting.  Muscle skeletal is not complaining of any joint pain--nursing staff feels he is favoring his right side however.  Neurologic does not complain of any syncope headache or dizziness .  psych  History of bipolar disorder this is been stable for some time  Physical exam.  He is afebrile pulse 54 respirations 18 blood pressure 144/71  In general this is a pleasant elderly male in no distress sitting comfortably in his wheelchair.  His skin is warm and dry--he does have a hematoma approximately 2 cm in diameter right fore head also has a small abrasion under his right knee.  Oropharynx clear mucous membranes moist.  Eyes. Visual acuity appears grossly intact sclera and conjunctiva are clear .  Chest is clear to auscultation no labored breathing.  Heart regular rate and rhythm-s-lightly bradycardic- without murmur gallop or rub he does not really have significant lower extremity edema.  Abdomen soft nontender with positive bowel sounds.  somewhat protuberant which is not new.  Muscle skeletal --ambulates wheelchair moves all extremities he is able to stand at baseline again quite weak is able to move all extremities strength appears to be intact all extremities -   Neurologic appears grossly intact cranial nerves intact to  speech is clear.--  Psych he is alert and oriented pleasant and appropriate .  Labs  10/10/2014.  Hemoglobin A1c 7.7.  Sodium 141 potassium 4.2 BUN 18 creatinine 1.04-liver function tests within normal limits albumin was 3.66.  WBC 7.4 hemoglobin 13.4 platelets 167.  Cholesterol 102 HDL 31-LDL 53-triglycerides 91  06/14/2014.  The WB C4.8 hemoglobin 12.5 platelets 150.  Hemoglobin A1c-7.6.  Sodium 138 potassium 4.1 BUN 14 creatinine 0.9.  Liver function tests within normal  limits.  06/06/2014.  Depakote level XXXIV.4.  .  02/19/2014.  Cholesterol 78-HDL 30-LDL 41-triglycerides 35.  Valproic acid 25.6.  Hemoglobin A1c-8.2 which appears to show some gradual improvement.  Sodium 142 potassium 3.6 BUN 16 creatinine 0.9.  Liver function tests within normal limits.  WBC 6.8 hemoglobin 12.5 platelets 159.  Assessment and plan.   --History of fall-he has sustained a hematoma to his right forehead an unwitnessed fall apparently there was some impact here--will to the ER for evaluation to rule out any neurologic complications especially-at this point appears to be stable.  Again does have a hematoma to his right fore head as well as abrasion under his right knee although musculoskeletal exam appears to be relatively baseline.  He is on anticoagulation-- aspirin-- he does have a history of CVA as well as coronary artery disease  Patient neurologically appear to be stable before EMS arrived per serial exams  [id number]    -        .

## 2015-03-30 NOTE — ED Notes (Signed)
[facility] called [id number]
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
## 2015-04-08 ENCOUNTER — Encounter: Payer: Self-pay | Admitting: Internal Medicine

## 2015-04-08 DIAGNOSIS — R269 Unspecified abnormalities of gait and mobility: Secondary | ICD-10-CM | POA: Insufficient documentation

## 2015-04-25 ENCOUNTER — Encounter: Payer: Self-pay | Admitting: Internal Medicine

## 2015-04-25 ENCOUNTER — Non-Acute Institutional Stay (SKILLED_NURSING_FACILITY): Payer: Medicare Other | Admitting: Internal Medicine

## 2015-04-25 DIAGNOSIS — F0390 Unspecified dementia without behavioral disturbance: Secondary | ICD-10-CM

## 2015-04-25 DIAGNOSIS — I10 Essential (primary) hypertension: Secondary | ICD-10-CM

## 2015-04-25 DIAGNOSIS — E119 Type 2 diabetes mellitus without complications: Secondary | ICD-10-CM | POA: Diagnosis not present

## 2015-04-25 DIAGNOSIS — G911 Obstructive hydrocephalus: Secondary | ICD-10-CM | POA: Diagnosis not present

## 2015-04-25 NOTE — Progress Notes (Signed)
Patient ID: Kyle Mach., male   DOB: Dec 26, 1950, 64 y.o.   MRN: [id number]           . This is a routine visit  Level care skilled.  Facility [facility]  . Chief complaintmedical management of chronic issues including diabetes type 2 coronary artery disease history CVA-bipolar disorder-hyperlipidemia-depression-history of hiatal hernia.--insomnia  History of present illness.  Patient is a pleasant 64 year old male with the above diagnoses he has been fairly stable --he did sustain a fall earlier this month and sustained abrasion to his head this has since resolved ER evaluation did not show any acute injury per review of ED note.  He does have a history of significant GERD with a history of hiatal hernia he is onDexilent as well as Zantac--this appears to be helping-it has been a significant issue in the past.  He is also diabetic-he is on Lantus 27 units daily as well as a sliding scale and Glucophage thousand milligrams twice a day   Blood sugars recently have been mainly in the lower mid 100s in the morning lowest one that I see recently is 72--at noon sugars somewhat more variable in the mid 100s to lower 200s--at 4:30 somewhat more elevated largely in the 2 300 range apparently he does snack quite a bit.  Most recent hemoglobin A1c  In August  was 7.4-we will update this   .    Hemoglobin A1c in September  Last year was 8.2  He also has a history of hypertension he is on lisinopril  As well as Toprol blood pressures appear to  Be somewhat variable I got 140/80 manually this evening listed blood pressures ranged from 130/75 up to 150/82 I do not see consistent elevations at this point will continue to monitor  He does have a history of coronary artery disease and CVA he continues on aspirin as well as a statin.--He did see a cardiologist  This summer and thought to be stable apparently there were some EKG changes that showed anterior T-wave inversion that has  worsened slightly-this could be due to coronary ischemia was thought to be non-urgent bycardiologyt since patient is not very active.  I do note he did have a cardiac echo last year per cardiology that showed normal systolic function and some mild diastolic failure  In regards of bipolar disorder he continues on Trileptal and Depakote and this appears to be stable   The past he has complained of insomnia he is not complaining of that this evening however he is on trazodone 150 mg daily at bedtime -   Family medical social history as been reviewe including most recent progress note and ER note on 03/30/2015 i  Medications have been reviewed per [facility]. Include.  Lantus 27 units daily at bedtime.  Tramadol 50 mg every 8 hours when necessary.  Norco 5-3 25 mg every 4 hours when necessary.  Clonazepam 1 mg twice a day.  Aspirin 81 mg daily.  Lisinopril 10 mg daily.  Trileptal 600 mg daily.  Iron 324 mg daily.  Lexapro 15 mg daily.  Aricept 10 mg daily.  Atorvastatin 80 mg daily.  NovoLog sliding scale with meals.  Trazodone 50 mg daily at bedtime.  Desilant 60 mg daily.  Zantac 150 mg twice a day.  Metoprolol 25 mg twice a day.  Depakote 250 mg twice a day.  Glucophage thousand milligrams twice a day.    Review of systems.  In general does not complain of any  fever or chills.  Skin does not complain of itching or rashes.  Head ears eyes nose mouth and throat-does not complain of any visual changes or sore throat.-- Does not complain of any nasal drainage or stuffiness  Respiratory no shortness of breath or cough.  Cardiac no chest pain or significant lower extremity edema.  GI does not complain of any abdominal issues tonight although he has had a history of this as noted above does not complain of abdominal pain nausea or vomiting.  Muscle skeletal is not complaining of any joint pain.  Neurologic does not complain of any syncope headache or  dizziness .  psych significant history here no overt anxiety sadness today--he is not complaining of insomnia this evening  Physical exam.  Is afebrile pulse of 60 respirations 18 blood pressure 140/80  In general this is a pleasant elderly male in no distress lying comfortably in bed.  His skin is warm and dry.  Oropharynx clear mucous membranes moist.  Eyes. Visual acuity appears grossly intact sclera and conjunctiva are clear .  Chest is clear to auscultation no labored breathing.  Heart regular rate and rhythm-- without murmur gallop or rub he does not really have significant lower extremity edema.  Abdomen soft nontender with positive bowel sounds.  somewhat protuberant which is not new.  Muscle skeletal -- moves all extremities at baseline I did not note any deformities.  Neurologic appears grossly intact cranial nerves intact to speech is clear.--  Psych he is alert and oriented pleasant and appropriate .  Labs  01/08/2015.  Albumin 3.3-bilirubin 0.2 otherwise liver function tests within normal limits.  Sodium 138 potassium 4.4 BUN 18 creatinine 1.0.  Hemoglobin A1c 7.4.  WBC 7.3 hemoglobin 12.1 platelets 157  10/10/2014.  Hemoglobin A1c 7.7.  Sodium 141 potassium 4.2 BUN 18 creatinine 1.04-liver function tests within normal limits albumin was 3.66.  WBC 7.4 hemoglobin 13.4 platelets 167.  Cholesterol 102 HDL 31-LDL 53-triglycerides 91  06/14/2014.  The WB C4.8 hemoglobin 12.5 platelets 150.  Hemoglobin A1c-7.6.  Sodium 138 potassium 4.1 BUN 14 creatinine 0.9.  Liver function tests within normal limits.  06/06/2014.  Depakote level XXXIV.4.  .  02/19/2014.  Cholesterol 78-HDL 30-LDL 41-triglycerides 35.  Valproic acid 25.6.  Hemoglobin A1c-8.2 which appears to show some gradual improvement.  Sodium 142 potassium 3.6 BUN 16 creatinine 0.9.  Liver function tests within normal limits.  WBC 6.8 hemoglobin 12.5 platelets  159.  Assessment and plan.  GERD-this appears to be stable he is on ranitidine as well as  Dexilant  #2-history of diabetes-somewhat challenging situation with patient snacking which he is aware of Nonetheless his hemoglobin A1c actually appears to be trending down we will update this at this time continue Lantus and Glucophage at current doses    . #3hypertension this appears stable on an ACE and beta blocker-occasional pulses in the 50s but he is asymptomatic at this point will monitor --will update a metabolic panel as well  #4-history of anemia-he is on iron-hemoglobin recently appears to be at baseline Will update this.  #5-history of bipolar disorder he is on Trileptal and Depakote this appears to be stable-- as well as lorazepam when necessary for anxiety--will update Depakote level.he is followed by psychiatric services   #6-history hyperlipidemia this appears stable ont lipid panel--last September he is on a statin. We'll update this- and a liver panel -  #7 history of insomnia-on trazodone and this appears to be working at this point monitor.   -   #  8-history of anxiety he is on clonazepam twice a day as needed this is been stable.  #9 history of coronary artery disease this is been relatively asymptomatic he is on aspirin as well as a statin--- seen by cardiolog earlier this summer   #10 history of dementia this appears to be quite mild he is on Aricept he continues to be pleasant and appropriate  #11-history of hydrocephalus-thought not to be a surgical candidate since he has not responded to high volume lumbar puncture as an outpatient-again his mental status appears to have been quite stable  for some time.  #12-history CVA-again he is on aspirin and a statin he appears to have adjusted quite well to the facility --   [id number]--of note greater than 35 minutes spent assessing patient-reviewing his chart-and coordinating and formulating a plan of care for numerous  diagnoses-of note greater than 50% of time spent coordinating plan of careWith review of his chart and consult notes     .

## 2015-04-29 LAB — HEPATIC FUNCTION PANEL
ALT: 19 U/L (ref 10–40)
AST: 16 U/L (ref 14–40)
Alkaline Phosphatase: 85 U/L (ref 25–125)
BILIRUBIN, TOTAL: 0.2 mg/dL

## 2015-04-29 LAB — BASIC METABOLIC PANEL
BUN: 24 mg/dL — AB (ref 4–21)
CREATININE: 1.1 mg/dL (ref 0.6–1.3)
GLUCOSE: 54 mg/dL
POTASSIUM: 3.9 mmol/L (ref 3.4–5.3)
Sodium: 144 mmol/L (ref 137–147)

## 2015-04-29 LAB — LIPID PANEL
Cholesterol: 89 mg/dL (ref 0–200)
HDL: 31 mg/dL — AB (ref 35–70)
LDL Cholesterol: 47 mg/dL
Triglycerides: 57 mg/dL (ref 40–160)

## 2015-04-29 LAB — CBC AND DIFFERENTIAL
HCT: 37 % — AB (ref 41–53)
Hemoglobin: 12.8 g/dL — AB (ref 13.5–17.5)
Platelets: 152 10*3/uL (ref 150–399)
WBC: 7.8 10*3/mL

## 2015-04-29 LAB — HEMOGLOBIN A1C: HEMOGLOBIN A1C: 7.3

## 2015-05-21 LAB — HM DIABETES FOOT EXAM

## 2015-06-05 ENCOUNTER — Encounter: Payer: Self-pay | Admitting: Internal Medicine

## 2015-06-05 ENCOUNTER — Non-Acute Institutional Stay (SKILLED_NURSING_FACILITY): Payer: Medicare Other | Admitting: Internal Medicine

## 2015-06-05 DIAGNOSIS — F32A Depression, unspecified: Secondary | ICD-10-CM

## 2015-06-05 DIAGNOSIS — F329 Major depressive disorder, single episode, unspecified: Secondary | ICD-10-CM | POA: Diagnosis not present

## 2015-06-05 DIAGNOSIS — F3175 Bipolar disorder, in partial remission, most recent episode depressed: Secondary | ICD-10-CM | POA: Diagnosis not present

## 2015-06-05 DIAGNOSIS — G4700 Insomnia, unspecified: Secondary | ICD-10-CM | POA: Diagnosis not present

## 2015-06-05 NOTE — Assessment & Plan Note (Signed)
Pt c/o increased insonia;plan - traszodone increased to 150 mg q HS
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
## 2015-06-05 NOTE — Assessment & Plan Note (Signed)
Pt reporting increased sadness; lexapro increased to 20 mg daily and will monitor response

## 2015-06-05 NOTE — Assessment & Plan Note (Signed)
Mood a little improved, no hallucinations or suicidal ideation;plan -cont trileptal 600 mg daily and depakote ER 500 mg q HS

## 2015-07-31 ENCOUNTER — Non-Acute Institutional Stay (SKILLED_NURSING_FACILITY): Payer: Medicare Other | Admitting: Internal Medicine

## 2015-07-31 ENCOUNTER — Encounter: Payer: Self-pay | Admitting: Internal Medicine

## 2015-07-31 DIAGNOSIS — I10 Essential (primary) hypertension: Secondary | ICD-10-CM

## 2015-07-31 DIAGNOSIS — E782 Mixed hyperlipidemia: Secondary | ICD-10-CM

## 2015-07-31 DIAGNOSIS — D509 Iron deficiency anemia, unspecified: Secondary | ICD-10-CM | POA: Diagnosis not present

## 2015-08-14 ENCOUNTER — Non-Acute Institutional Stay (SKILLED_NURSING_FACILITY): Payer: Medicare Other | Admitting: Internal Medicine

## 2015-08-14 ENCOUNTER — Encounter: Payer: Self-pay | Admitting: Internal Medicine

## 2015-08-14 DIAGNOSIS — I10 Essential (primary) hypertension: Secondary | ICD-10-CM

## 2015-08-14 DIAGNOSIS — F0390 Unspecified dementia without behavioral disturbance: Secondary | ICD-10-CM

## 2015-08-14 DIAGNOSIS — I639 Cerebral infarction, unspecified: Secondary | ICD-10-CM

## 2015-08-14 DIAGNOSIS — F3177 Bipolar disorder, in partial remission, most recent episode mixed: Secondary | ICD-10-CM | POA: Diagnosis not present

## 2015-08-14 NOTE — Progress Notes (Signed)
Patient ID: Kyle ShireCharles E Matthew Jr., male   DOB: [birth year], 65 y.o.   MRN: [id number]           . This is a routine visit  Level care skilled.  Facility [facility]  . Chief complaintmedical management of chronic issues including diabetes type 2 coronary artery disease history CVA-bipolar disorder-hyperlipidemia-depression-history of hiatal hernia.--insomnia  History of present illness.  Patient is a pleasant 65 year old male with the above diagnoses he has been fairly stable --he did sustain a fall earlier this month and sustained abrasion to his head this has since resolved ER evaluation did not show any acute injury per review of ED note.  He does have a history of significant GERD with a history of hiatal hernia he is onDexilent as well as Zantac--this appears to be helping-it has been a significant issue in the past.  He is also diabetic-he is on Lantus 27 units daily as well as a sliding scale and Glucophage thousand milligrams twice a day   Blood sugars recently have been mainly in the lower mid 100s in the morning lowest one that I see recently is 72--at noon sugars somewhat more variable in the mid 100s to lower 200s--at 4:30 somewhat more elevated largely in the 2 300 range apparently he does snack quite a bit.  Most recent hemoglobin A1c  In August  was 7.4-we will update this   .    Hemoglobin A1c in September  Last year was 8.2  He also has a history of hypertension he is on lisinopril  As well as Toprol blood pressures appear to  Be somewhat variable I got 140/80 manually this evening listed blood pressures ranged from 130/75 up to 150/82 I do not see consistent elevations at this point will continue to monitor  He does have a history of coronary artery disease and CVA he continues on aspirin as well as a statin.--He did see a cardiologist  This summer and thought to be stable apparently there were some EKG changes that showed anterior T-wave inversion that has  worsened slightly-this could be due to coronary ischemia was thought to be non-urgent bycardiologyt since patient is not very active.  I do note he did have a cardiac echo last year per cardiology that showed normal systolic function and some mild diastolic failure  In regards of bipolar disorder he continues on Trileptal and Depakote and this appears to be stable   The past he has complained of insomnia he is not complaining of that this evening however he is on trazodone 150 mg daily at bedtime -   Family medical social history as been reviewe including most recent progress note and ER note on 03/30/2015 i  Medications [facility]. Include.  Lantus 27 units daily at bedtime.  Tramadol 50 mg every 8 hours when necessary.  Norco 5-3 25 mg every 4 hours when necessary.  Clonazepam 1 mg twice a day.  Aspirin 81 mg daily.  Lisinopril 10 mg daily.  Trileptal 600 mg daily.  Iron 324 mg daily.  Lexapro 15 mg daily.  Aricept 10 mg daily.  Atorvastatin 80 mg daily.  NovoLog sliding scale with meals.  Trazodone 50 mg daily at bedtime.  Desilant 60 mg daily.  Zantac 150 mg twice a day.  Metoprolol 25 mg twice a day.  Depakote 250 mg twice a day.  Glucophage thousand milligrams twice a day.    Review of systems.  In general does not complain of any  fever or chills.  Skin does not complain of itching or rashes.  Head ears eyes nose mouth and throat-does not complain of any visual changes or sore throat.-- Does not complain of any nasal drainage or stuffiness  Respiratory no shortness of breath or cough.  Cardiac no chest pain or significant lower extremity edema.  GI does not complain of any abdominal issues tonight although he has had a history of this as noted above does not complain of abdominal pain nausea or vomiting.  Muscle skeletal is not complaining of any joint pain.  Neurologic does not complain of any syncope headache or  dizziness .  psych significant history here no overt anxiety sadness today--he is not complaining of insomnia this evening  Physical exam.  Is afebrile pulse of 60 respirations 18 blood pressure 140/80  In general this is a pleasant elderly male in no distress lying comfortably in bed.  His skin is warm and dry.  Oropharynx clear mucous membranes moist.  Eyes. Visual acuity appears grossly intact sclera and conjunctiva are clear .  Chest is clear to auscultation no labored breathing.  Heart regular rate and rhythm-- without murmur gallop or rub he does not really have significant lower extremity edema.  Abdomen soft nontender with positive bowel sounds.  somewhat protuberant which is not new.  Muscle skeletal -- moves all extremities at baseline I did not note any deformities.  Neurologic appears grossly intact cranial nerves intact to speech is clear.--  Psych he is alert and oriented pleasant and appropriate .  Labs  01/08/2015.  Albumin 3.3-bilirubin 0.2 otherwise liver function tests within normal limits.  Sodium 138 potassium 4.4 BUN 18 creatinine 1.0.  Hemoglobin A1c 7.4.  WBC 7.3 hemoglobin 12.1 platelets 157  10/10/2014.  Hemoglobin A1c 7.7.  Sodium 141 potassium 4.2 BUN 18 creatinine 1.04-liver function tests within normal limits albumin was 3.66.  WBC 7.4 hemoglobin 13.4 platelets 167.  Cholesterol 102 HDL 31-LDL 53-triglycerides 91  06/14/2014.  The WB C4.8 hemoglobin 12.5 platelets 150.  Hemoglobin A1c-7.6.  Sodium 138 potassium 4.1 BUN 14 creatinine 0.9.  Liver function tests within normal limits.  06/06/2014.  Depakote level XXXIV.4.  .  02/19/2014.  Cholesterol 78-HDL 30-LDL 41-triglycerides 35.  Valproic acid 25.6.  Hemoglobin A1c-8.2 which appears to show some gradual improvement.  Sodium 142 potassium 3.6 BUN 16 creatinine 0.9.  Liver function tests within normal limits.  WBC 6.8 hemoglobin 12.5 platelets  159.  Assessment and plan.  GERD-this appears to be stable he is on ranitidine as well as  Dexilant  #2-history of diabetes-somewhat challenging situation with patient snacking which he is aware of Nonetheless his hemoglobin A1c actually appears to be trending down we will update this at this time continue Lantus and Glucophage at current doses    . #3hypertension this appears stable on an ACE and beta blocker-occasional pulses in the 50s but he is asymptomatic at this point will monitor --will update a metabolic panel as well  #4-history of anemia-he is on iron-hemoglobin recently appears to be at baseline Will update this.  #5-history of bipolar disorder he is on Trileptal and Depakote this appears to be stable-- as well as lorazepam when necessary for anxiety--will update Depakote level.he is followed by psychiatric services   #6-history hyperlipidemia this appears stable ont lipid panel--last September he is on a statin. We'll update this- and a liver panel -  #7 history of insomnia-on trazodone and this appears to be working at this point monitor.   -   #  8-history of anxiety he is on clonazepam twice a day as needed this is been stable.  #9 history of coronary artery disease this is been relatively asymptomatic he is on aspirin as well as a statin--- seen by cardiolog earlier this summer   #10 history of dementia this appears to be quite mild he is on Aricept he continues to be pleasant and appropriate  #11-history of hydrocephalus-thought not to be a surgical candidate since he has not responded to high volume lumbar puncture as an outpatient-again his mental status appears to have been quite stable  for some time.  #12-history CVA-again he is on aspirin and a statin he appears to have adjusted quite well to the facility --   [id number]--of note greater than 35 minutes spent assessing patient-reviewing his chart-and coordinating and formulating a plan of care for numerous  diagnoses-of note greater than 50% of time spent coordinating plan of careWith review of his chart and consult notes     .

## 2015-08-18 NOTE — Assessment & Plan Note (Signed)
Stable;controlled , LDL 47 and HDL 31, on lipitor 80 mg daily

## 2015-08-18 NOTE — Assessment & Plan Note (Signed)
At baseline  of12.8 in 04/2015 ; plan - cnt iron supplement

## 2015-08-18 NOTE — Assessment & Plan Note (Signed)
Stable ;plan - conr toprol XL 25 mg BIS and lisinopril 10 mg daily

## 2015-08-18 NOTE — Progress Notes (Signed)
Patient ID: Kyle ShireCharles E Coultas Jr., male   DOB: Mar 01, 1951, 65 y.o.   MRN: [id number]            . This is a routine visit  Level care skilled.  Facility [facility]  . Chief complaintmedical management of chronic issues including doronary artery disease history CVA-bipolar disorder-hyperlipidemia-depression-history of hiatal hernia.--insomnia  History of present illness.  Patient is a pleasant 65 year old male with the above diagnoses he has been fairly stable -   He does have a history of significant GERD with a history of hiatal hernia he is onDexilent as well as Zantac--this appears to be helping-it has been a significant issue in the past.           He also has a history of hypertension he is on lisinopril  As well as Toprol blood pressures appear to  Be somewhat variable I   He does have a history of coronary artery disease and CVA he continues on aspirin as well as a statin.--He did see a cardiologist  past summer and thought to be stable apparently there were some EKG changes that showed anterior T-wave inversion that has worsened slightly-this could be due to coronary ischemia was thought to be non-urgent bycardiologyt since patient is not very active.  I do note he did have a cardiac echo er cardiology that showed normal systolic function and some mild diastolic failure  In regards of bipolar disorder he continues on Trileptal and Depakote and this appears to be stable   The past he has complained of insomnia he is not complaining of that this evening however he is on trazodone 150 mg daily at bedtime -   Family medical social history as been reviewe including most recent progress 06/05/2015  [facility] Include.  Lantus 27 units daily at bedtime.  Tramadol 50 mg every 8 hours when necessary.  Norco 5-3 25 mg every 4 hours when necessary.  Clonazepam 1 mg twice a day.  Aspirin 81 mg daily.  Lisinopril 10 mg  daily.  Trileptal 600 mg daily.  Iron 324 mg daily.  Lexapro 15 mg daily.  Aricept 10 mg daily.  Atorvastatin 80 mg daily.  NovoLog sliding scale with meals.  Trazodone 50 mg daily at bedtime.  Desilant 60 mg daily.  Zantac 150 mg twice a day.  Metoprolol 25 mg twice a day.  Depakote 250 mg twice a day.  Glucophage thousand milligrams twice a day.    Review of systems.  In general does not complain of any fever or chills.  Skin does not complain of itching or rashes.  Head ears eyes nose mouth and throat-does not complain of any visual changes or sore throat.-- Does not complain of any nasal drainage or stuffiness  Respiratory no shortness of breath or cough.  Cardiac no chest pain or significant lower extremity edema.  GI does not complain of any abdominal issues-- although he has had a history of this as noted above does not complain of abdominal pain nausea or vomiting.  Muscle skeletal is not complaining of any joint pain.  Neurologic does not complain of any syncope headache or dizziness .  psych significant history here no overt anxiety sadness today--he is not complaining of insomnia this evening  Physical exam.  He is afebrile pulse of 60 respirations 16  In general this is a pleasant elderly male in no distress sitting in his wheelchair.  His skin is warm and dry.  Oropharynx clear  mucous membranes moist.  Eyes. Visual acuity appears grossly intact sclera and conjunctiva are clear .  Chest is clear to auscultation no labored breathing.  Heart regular rate and rhythm-- without murmur gallop or rub he does not really have significant lower extremity edema.  Abdomen soft nontender with positive bowel sounds.  somewhat protuberant which is not new.  Muscle skeletal -- moves all extremities at baseline I did not note any deformities.  Neurologic appears grossly intact cranial nerves intact to speech is clear.--  Psych he is alert and  oriented pleasant and appropriate .  Labs  01/08/2015.  Albumin 3.3-bilirubin 0.2 otherwise liver function tests within normal limits.  Sodium 138 potassium 4.4 BUN 18 creatinine 1.0.  Hemoglobin A1c 7.4.  WBC 7.3 hemoglobin 12.1 platelets 157  10/10/2014.  Hemoglobin A1c 7.7.  Sodium 141 potassium 4.2 BUN 18 creatinine 1.04-liver function tests within normal limits albumin was 3.66.  WBC 7.4 hemoglobin 13.4 platelets 167.  Cholesterol 102 HDL 31-LDL 53-triglycerides 91  06/14/2014.  The WB C4.8 hemoglobin 12.5 platelets 150.  Hemoglobin A1c-7.6.  Sodium 138 potassium 4.1 BUN 14 creatinine 0.9.  Liver function tests within normal limits.  06/06/2014.  Depakote level XXXIV.4.  .  02/19/2014.  Cholesterol 78-HDL 30-LDL 41-triglycerides 35.  Valproic acid 25.6.  Hemoglobin A1c-8.2 which appears to show some gradual improvement.  Sodium 142 potassium 3.6 BUN 16 creatinine 0.9.  Liver function tests within normal limits.  WBC 6.8 hemoglobin 12.5 platelets 159.  Assessment and plan.  GERD-this appears to be stable he is on ranitidine as well as  Dexilant         . hypertension this appears stable on an ACE and beta blocker-occasional pulses in the 50s but he is asymptomatic at this point will monitor --will update a metabolic panel as well  -history of anemia-he is on iron-hemoglobin recently appears to be at baseline Will update this.  -history of bipolar disorder he is on Trileptal and Depakote this appears to be stable-- as well as lorazepam when necessary for anxiety--he is followed by psychiatric services   -history hyperlipidemia this appears stable ont lipid panel--last September he is on a statin. We'll update LFT's   history of insomnia-on trazodone and this appears to be working at this point monitor.   -  history of anxiety he is on clonazepam twice a day as needed this is been stable.   history of coronary artery disease this is  been relatively asymptomatic he is on aspirin as well as a statin--- seen by cardiology   history of dementia this appears to be quite mild he is on Aricept he continues to be pleasant and appropriate  -history of hydrocephalus-thought not to be a surgical candidate since he has not responded to high volume lumbar puncture as an outpatient-again his mental status appears to have been quite stable  for some time. -history CVA-again he is on aspirin and a statin he appears to have adjusted quite well to the facility --    [id number]     .
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
## 2015-08-20 LAB — CBC AND DIFFERENTIAL
HCT: 40 % — AB (ref 41–53)
Hemoglobin: 13.8 g/dL (ref 13.5–17.5)
Platelets: 203 10*3/uL (ref 150–399)
WBC: 8.5 10*3/mL

## 2015-08-20 LAB — HEPATIC FUNCTION PANEL
ALT: 17 U/L (ref 10–40)
AST: 15 U/L (ref 14–40)
Alkaline Phosphatase: 100 U/L (ref 25–125)
BILIRUBIN, TOTAL: 0.4 mg/dL

## 2015-08-20 LAB — BASIC METABOLIC PANEL
BUN: 118 mg/dL — AB (ref 4–21)
Creatinine: 0.8 mg/dL (ref <=1.3)
Glucose: 114 mg/dL
Potassium: 4.8 mmol/L (ref 3.4–5.3)
SODIUM: 138 mmol/L (ref 137–147)

## 2015-08-20 LAB — HEMOGLOBIN A1C: Hemoglobin A1C: 7

## 2015-10-01 ENCOUNTER — Encounter: Payer: Self-pay | Admitting: Internal Medicine

## 2015-10-01 ENCOUNTER — Non-Acute Institutional Stay (SKILLED_NURSING_FACILITY): Payer: Medicare Other | Admitting: Internal Medicine

## 2015-10-01 DIAGNOSIS — E119 Type 2 diabetes mellitus without complications: Secondary | ICD-10-CM | POA: Diagnosis not present

## 2015-10-01 DIAGNOSIS — Z794 Long term (current) use of insulin: Secondary | ICD-10-CM

## 2015-10-01 DIAGNOSIS — K219 Gastro-esophageal reflux disease without esophagitis: Secondary | ICD-10-CM | POA: Diagnosis not present

## 2015-10-01 DIAGNOSIS — F0390 Unspecified dementia without behavioral disturbance: Secondary | ICD-10-CM
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
## 2015-10-02 ENCOUNTER — Encounter: Payer: Self-pay | Admitting: Internal Medicine

## 2015-10-02 NOTE — Assessment & Plan Note (Signed)
Controlled; cont dexilant 60 mg daily and zantac 150 mg BID

## 2015-10-02 NOTE — Assessment & Plan Note (Signed)
Chronic and stable; cont aeicept 10 mg daily

## 2015-10-02 NOTE — Assessment & Plan Note (Signed)
A1c 7.0 on glucophage 1000 mg BID, lantus 27 u qHS and SSI with meals; pt on ACE and statin

## 2015-11-04 ENCOUNTER — Encounter: Payer: Self-pay | Admitting: Internal Medicine
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
## 2015-11-07 ENCOUNTER — Non-Acute Institutional Stay (SKILLED_NURSING_FACILITY): Payer: Medicare Other | Admitting: Internal Medicine

## 2015-11-07 DIAGNOSIS — F0390 Unspecified dementia without behavioral disturbance: Secondary | ICD-10-CM

## 2015-11-07 DIAGNOSIS — I639 Cerebral infarction, unspecified: Secondary | ICD-10-CM

## 2015-11-07 DIAGNOSIS — K219 Gastro-esophageal reflux disease without esophagitis: Secondary | ICD-10-CM | POA: Diagnosis not present

## 2015-11-07 DIAGNOSIS — I10 Essential (primary) hypertension: Secondary | ICD-10-CM | POA: Diagnosis not present

## 2015-11-07 DIAGNOSIS — E118 Type 2 diabetes mellitus with unspecified complications: Secondary | ICD-10-CM | POA: Diagnosis not present

## 2015-11-07 DIAGNOSIS — F3170 Bipolar disorder, currently in remission, most recent episode unspecified: Secondary | ICD-10-CM | POA: Diagnosis not present

## 2015-11-07 NOTE — Progress Notes (Signed)
This encounter was created in error - please disregard.

## 2015-11-07 NOTE — Progress Notes (Deleted)
Patient ID: Kyle Rudnick., male   DOB: [birth year], 65 y.o.   MRN: [id number] Patient ID: Kyle Clubb., male   DOB: 1951/03/04, 65 y.o.   MRN: [id number]           . This is a routine visit  Level care skilled.  Facility [facility]  . Chief complaintmedical management of chronic issues including diabetes type 2 coronary artery disease history CVA-bipolar disorder-hyperlipidemia-depression-history of hiatal hernia.--insomnia  History of present illness.  Patient is a pleasant 65 year old male with the above diagnoses he has been fairly stable --he did sustain a fall earlier this month and sustained abrasion to his head this has since resolved ER evaluation did not show any acute injury per review of ED note.  He does have a history of significant GERD with a history of hiatal hernia he is onDexilent as well as Zantac--this appears to be helping-it has been a significant issue in the past.  He is also diabetic-he is on Lantus 27 units daily as well as a sliding scale and Glucophage thousand milligrams twice a day   Blood sugars recently have been mainly in the lower mid 100s in the morning lowest one that I see recently is 72--at noon sugars somewhat more variable in the mid 100s to lower 200s--at 4:30 somewhat more elevated largely in the 2 300 range apparently he does snack quite a bit.  Most recent hemoglobin A1c  In August  was 7.4-we will update this   .    Hemoglobin A1c in September  Last year was 8.2  He also has a history of hypertension he is on lisinopril  As well as Toprol blood pressures appear to  Be somewhat variable I got 140/80 manually this evening listed blood pressures ranged from 130/75 up to 150/82 I do not see consistent elevations at this point will continue to monitor  He does have a history of coronary artery disease and CVA he continues on aspirin as well as a statin.--He did see a cardiologist  This summer and thought to be stable  apparently there were some EKG changes that showed anterior T-wave inversion that has worsened slightly-this could be due to coronary ischemia was thought to be non-urgent bycardiologyt since patient is not very active.  I do note he did have a cardiac echo last year per cardiology that showed normal systolic function and some mild diastolic failure  In regards of bipolar disorder he continues on Trileptal and Depakote and this appears to be stable   The past he has complained of insomnia he is not complaining of that this evening however he is on trazodone 150 mg daily at bedtime -   Family medical social history as been reviewe including most recent progress note and ER note on 03/30/2015 i  Medications have been reviewed per [facility]. Include.  Lantus 27 units daily at bedtime.  Tramadol 50 mg every 8 hours when necessary.  Norco 5-3 25 mg every 4 hours when necessary.  Clonazepam 1 mg twice a day.  Aspirin 81 mg daily.  Lisinopril 10 mg daily.  Trileptal 600 mg daily.  Iron 324 mg daily.  Lexapro 15 mg daily.  Aricept 10 mg daily.  Atorvastatin 80 mg daily.  NovoLog sliding scale with meals.  Trazodone 50 mg daily at bedtime.  Desilant 60 mg daily.  Zantac 150 mg twice a day.  Metoprolol 25 mg twice a day.  Depakote 250 mg twice a day.  Glucophage thousand milligrams  twice a day.    Review of systems.  In general does not complain of any fever or chills.  Skin does not complain of itching or rashes.  Head ears eyes nose mouth and throat-does not complain of any visual changes or sore throat.-- Does not complain of any nasal drainage or stuffiness  Respiratory no shortness of breath or cough.  Cardiac no chest pain or significant lower extremity edema.  GI does not complain of any abdominal issues tonight although he has had a history of this as noted above does not complain of abdominal pain nausea or vomiting.  Muscle skeletal is not complaining of  any joint pain.  Neurologic does not complain of any syncope headache or dizziness .  psych significant history here no overt anxiety sadness today--he is not complaining of insomnia this evening  Physical exam.  Is afebrile pulse of 60 respirations 18 blood pressure 140/80  In general this is a pleasant elderly male in no distress lying comfortably in bed.  His skin is warm and dry.  Oropharynx clear mucous membranes moist.  Eyes. Visual acuity appears grossly intact sclera and conjunctiva are clear .  Chest is clear to auscultation no labored breathing.  Heart regular rate and rhythm-- without murmur gallop or rub he does not really have significant lower extremity edema.  Abdomen soft nontender with positive bowel sounds.  somewhat protuberant which is not new.  Muscle skeletal -- moves all extremities at baseline I did not note any deformities.  Neurologic appears grossly intact cranial nerves intact to speech is clear.--  Psych he is alert and oriented pleasant and appropriate .  Labs  01/08/2015.  Albumin 3.3-bilirubin 0.2 otherwise liver function tests within normal limits.  Sodium 138 potassium 4.4 BUN 18 creatinine 1.0.  Hemoglobin A1c 7.4.  WBC 7.3 hemoglobin 12.1 platelets 157  10/10/2014.  Hemoglobin A1c 7.7.  Sodium 141 potassium 4.2 BUN 18 creatinine 1.04-liver function tests within normal limits albumin was 3.66.  WBC 7.4 hemoglobin 13.4 platelets 167.  Cholesterol 102 HDL 31-LDL 53-triglycerides 91  06/14/2014.  The WB C4.8 hemoglobin 12.5 platelets 150.  Hemoglobin A1c-7.6.  Sodium 138 potassium 4.1 BUN 14 creatinine 0.9.  Liver function tests within normal limits.  06/06/2014.  Depakote level XXXIV.4.  .  02/19/2014.  Cholesterol 78-HDL 30-LDL 41-triglycerides 35.  Valproic acid 25.6.  Hemoglobin A1c-8.2 which appears to show some gradual improvement.  Sodium 142 potassium 3.6 BUN 16 creatinine 0.9.  Liver function tests  within normal limits.  WBC 6.8 hemoglobin 12.5 platelets 159.  Assessment and plan.  GERD-this appears to be stable he is on ranitidine as well as  Dexilant  #2-history of diabetes-somewhat challenging situation with patient snacking which he is aware of Nonetheless his hemoglobin A1c actually appears to be trending down we will update this at this time continue Lantus and Glucophage at current doses    . #3hypertension this appears stable on an ACE and beta blocker-occasional pulses in the 50s but he is asymptomatic at this point will monitor --will update a metabolic panel as well  #4-history of anemia-he is on iron-hemoglobin recently appears to be at baseline Will update this.  #5-history of bipolar disorder he is on Trileptal and Depakote this appears to be stable-- as well as lorazepam when necessary for anxiety--will update Depakote level.he is followed by psychiatric services   #6-history hyperlipidemia this appears stable ont lipid panel--last September he is on a statin. We'll update this- and a liver panel -  #  7 history of insomnia-on trazodone and this appears to be working at this point monitor.   -   #8-history of anxiety he is on clonazepam twice a day as needed this is been stable.  #9 history of coronary artery disease this is been relatively asymptomatic he is on aspirin as well as a statin--- seen by cardiolog earlier this summer   #10 history of dementia this appears to be quite mild he is on Aricept he continues to be pleasant and appropriate  #11-history of hydrocephalus-thought not to be a surgical candidate since he has not responded to high volume lumbar puncture as an outpatient-again his mental status appears to have been quite stable  for some time.  #12-history CVA-again he is on aspirin and a statin he appears to have adjusted quite well to the facility --   [id number]--of note greater than 35 minutes spent assessing patient-reviewing his chart-and  coordinating and formulating a plan of care for numerous diagnoses-of note greater than 50% of time spent coordinating plan of careWith review of his chart and consult notes     .

## 2015-11-10 ENCOUNTER — Encounter: Payer: Self-pay | Admitting: Internal Medicine

## 2015-11-10 NOTE — Progress Notes (Signed)
Patient ID: Kyle [name]., male   DOB: November 12, 1950, 65 y.o.   MRN: [id number]            . This is a routine visit  Level care skilled.  Facility [facility]  . Chief complaintmedical management of chronic issues including coronary artery disease history CVA-bipolar disorder-hyperlipidemia-depression-history of hiatal hernia.--insomnia--diabetes type 2  History of present illness.  Patient is a pleasant 65 year old male with the above diagnoses he has been fairly stable   He does have a history of significant GERD with a history of hiatal hernia he is onDexilent as well as Zantac--this appears to be helping-it has been a significant issue in the past.  He is also diabetic-he is on Lantus 27 units daily as well as a sliding scale and Glucophage thousand milligrams twice a day--HG A1c continues to show improvement most recently  7.0 back on 08/20/2015  Per review of CBGs he does have at times some low readings in the 60s and 70s in the morning-there is more more stability later in the day readings appear to be More in the morning    Hemoglobin A1c in September  Last year was 8.2  He also has a history of hypertension he is on lisinopril  As well as Toprol blood pressures appear to  Be somewhat variable but not consistently elevated most recently 141/75-100/60-131/74  He is on Lopressor 25 mg twice a day appears pulses sometimes are in the 50s but he is asymptomatic there are orders to hold beta blocker if pulse is less than 55  He does have a history of coronary artery disease and CVA he continues on aspirin as well as a statin.--He did see a cardiologist  Last  summer and thought to be stable apparently there were some EKG changes that showed anterior T-wave inversion that has worsened slightly-this could be due to coronary ischemia was thought to be non-urgent bycardiologyt since patient is not very active.  I do note he did have a cardiac echo  per cardiology that  showed normal systolic function and some mild diastolic failure  In regards of bipolar disorder he continues on Trileptal and Depakote and this appears to be stable   In the  past he has complained of insomnia he is not complaining of that this evening however he is on trazodone 150 mg daily at bedtime - Past medical history.  History of bipolar disorder.  Type 2 diabetes.  Attention deficit disorder.  Coronary artery disease.  Hyperlipidemia.  History CVA.  History of hiatal hernia.  Anxiety.  Depression.    Family medical social history has been reviewed including most recent progress notes-including 10/01/2015 and 08/14/2015--  Medications have been reviewed per [facility].  Include.  Lantus 27 units daily at bedtime.  Tramadol 50 mg every 8 hours when necessary.  Norco 5-3 25 mg every 4 hours when necessary.  Clonazepam 1 mg twice a day.  Aspirin 81 mg daily.  Lisinopril 10 mg daily.  Trileptal 600 mg daily.  Iron 324 mg daily.  Lexapro 20 mg daily.  Aricept 10 mg daily.  Atorvastatin 80 mg daily.  NovoLog sliding scale with meals.  Trazodone 50 mg daily at bedtime.  Desilant 60 mg daily.  Zantac 150 mg twice a day.  Metoprolol 25 mg twice a day.  Depakote 250 mg twice a day.  Glucophage thousand milligrams twice a day.    Review of systems. Provided by patient somewhat limited secondary to patient being a poor  historian  In general does not complain of any fever or chills.  Skin does not complain of itching or rashes.  Head ears eyes nose mouth and throat-does not complain of any visual changes or sore throat.-- Does not complain of any nasal drainage or stuffiness  Respiratory no shortness of breath or cough.  Cardiac no chest pain or significant lower extremity edema.  GI does not complain of any abdominal issues tonight although he has had a history of this as noted above does not complain of abdominal pain nausea or  vomiting.  Muscle skeletal is not complaining of any joint pain.  Neurologic does not complain of any syncope headache or dizziness .  psych significant history here  but no overt anxiety sadness today--he is not complaining of insomnia this evening  Physical exam.   He is afebrile pulse of 56 respirations 18 blood pressure 141/75-100/60 in this range most recently  In general this is a pleasant elderly male in no distress lying comfortably in bed.  His skin is warm and dry.  Oropharynx clear mucous membranes moist.  Eyes. Visual acuity appears grossly intact sclera and conjunctiva are clear .  Chest is clear to auscultation no labored breathing.  Heart regular rate and rhythm-- without murmur gallop or rub he does not really have significant lower extremity edema.  Abdomen soft nontender with positive bowel sounds.  somewhat protuberant which is not new.  Muscle skeletal -- moves all extremities at baseline I did not note any deformities.  Neurologic appears grossly intact cranial nerves intact to speech is clear.--  Psych he is alert and oriented to self-- pleasant and appropriate .--Continues to have a flat affect  Labs  08/20/2015.  WBC 8.5 hemoglobin 13.8 platelets 203.  Sodium 138 potassium 4.8 BUN 18 creatinine 0.81.  Liver function tests within normal limits.  Hemoglobin A1c 7.0.  04/29/2015.  HDL 31-LDL 47-total cholesterol 89-triglycerides 57  01/08/2015.  Albumin 3.3-bilirubin 0.2 otherwise liver function tests within normal limits.  Sodium 138 potassium 4.4 BUN 18 creatinine 1.0.  Hemoglobin A1c 7.4.  WBC 7.3 hemoglobin 12.1 platelets 157  10/10/2014.  Hemoglobin A1c 7.7.  Sodium 141 potassium 4.2 BUN 18 creatinine 1.04-liver function tests within normal limits albumin was 3.66.  WBC 7.4 hemoglobin 13.4 platelets 167.  Cholesterol 102 HDL 31-LDL 53-triglycerides 91  06/14/2014.  The WB C4.8 hemoglobin 12.5 platelets  150.  Hemoglobin A1c-7.6.  Sodium 138 potassium 4.1 BUN 14 creatinine 0.9.  Liver function tests within normal limits.  06/06/2014.  Depakote level XXXIV.4.  .  02/19/2014.  Cholesterol 78-HDL 30-LDL 41-triglycerides 35.  Valproic acid 25.6.  Hemoglobin A1c-8.2 which appears to show some gradual improvement.  Sodium 142 potassium 3.6 BUN 16 creatinine 0.9.  Liver function tests within normal limits.  WBC 6.8 hemoglobin 12.5 platelets 159.  Assessment and plan.  GERD-this appears to be stable he is on ranitidine as well as  Dexilant   Diabetes type 2-patient does have some low readings in the morning hemoglobin A1c has shown improvement at 7.0 on lab done in March-we will decrease his Lantus to 22 units every morning secondary to concerns of the low a.m. readings-I suspect this may lead to higher readings later in the day we will have to follow up on this-he does snack later in the day which I suspect add to at times higher CBGs--will follow-up as warranted    . #3hypertension this appears stable on an ACE and beta blocker-occasional pulses in the 50s but he  is asymptomatic at this point will monitor --will update a metabolic panel as well  #4-history of anemia-he is on iron-hemoglobin recently 13.8 back in March-- Will update this.  #5-history of bipolar disorder he is on Trileptal and Depakote this appears to be stable-- as well as lorazepam when necessary for anxiety--w.he is followed by psychiatric services   #6-history hyperlipidemia this appears stable --LDL 47 on lab done back in December 2016 will update this  #7 history of insomnia-on trazodone and this appears to be working at this point monitor.   -   #8-history of anxiety he is on clonazepam twice a day as needed this is been stable.  #9 history of coronary artery disease this is been relatively asymptomatic he is on aspirin as well as a statin--- seen by cardiolog earlier this summer   #10 history  of dementia this appears to be quite mild he is on Aricept he continues to be pleasant and appropriate  #11-history of hydrocephalus-thought not to be a surgical candidate since he has not responded to high volume lumbar puncture as an outpatient-again his mental status appears to be relatively stable  #12-history CVA-again he is on aspirin and a statin he appears to have adjusted quite well to the facility --   [id number]--of note greater than 35 minutes spent assessing patient-reviewing his chart-and coordinating and formulating a plan of care for numerous diagnoses-of note greater than 50% of time spent coordinating plan of care     .

## 2015-11-13 LAB — LIPID PANEL
Cholesterol: 100 mg/dL (ref 0–200)
HDL: 32 mg/dL — AB (ref 35–70)
LDL Cholesterol: 37 mg/dL
Triglycerides: 153 mg/dL (ref 40–160)

## 2015-11-13 LAB — BASIC METABOLIC PANEL
BUN: 18 mg/dL (ref 4–21)
Creatinine: 0.9 mg/dL (ref <=1.3)
GLUCOSE: 291 mg/dL
Potassium: 4.5 mmol/L (ref 3.4–5.3)
Sodium: 137 mmol/L (ref 137–147)

## 2015-11-13 LAB — HEPATIC FUNCTION PANEL
ALT: 20 U/L (ref 10–40)
AST: 20 U/L (ref 14–40)
Alkaline Phosphatase: 104 U/L (ref 25–125)
BILIRUBIN, TOTAL: 0.3 mg/dL

## 2015-11-13 LAB — CBC AND DIFFERENTIAL
HCT: 42 % (ref 41–53)
Hemoglobin: 13.8 g/dL (ref 13.5–17.5)
PLATELETS: 196 10*3/uL (ref 150–399)
WBC: 9.4 10^3/mL

## 2015-11-13 LAB — HM DIABETES FOOT EXAM

## 2015-12-20 ENCOUNTER — Non-Acute Institutional Stay (SKILLED_NURSING_FACILITY): Payer: Medicare Other | Admitting: Internal Medicine

## 2015-12-20 ENCOUNTER — Encounter: Payer: Self-pay | Admitting: Internal Medicine

## 2015-12-20 DIAGNOSIS — F329 Major depressive disorder, single episode, unspecified: Secondary | ICD-10-CM

## 2015-12-20 DIAGNOSIS — R52 Pain, unspecified: Secondary | ICD-10-CM

## 2015-12-20 DIAGNOSIS — F32A Depression, unspecified: Secondary | ICD-10-CM

## 2015-12-20 DIAGNOSIS — G4700 Insomnia, unspecified: Secondary | ICD-10-CM | POA: Diagnosis not present
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
## 2015-12-21 ENCOUNTER — Encounter: Payer: Self-pay | Admitting: Internal Medicine

## 2015-12-21 DIAGNOSIS — R52 Pain, unspecified: Secondary | ICD-10-CM | POA: Insufficient documentation

## 2015-12-25 LAB — PSA: PSA: 0.27

## 2015-12-25 LAB — TSH: TSH: 1.72 u[IU]/mL (ref 0.41–5.90)

## 2016-01-07 LAB — HM DIABETES EYE EXAM

## 2016-01-20 ENCOUNTER — Encounter: Payer: Self-pay | Admitting: Internal Medicine

## 2016-01-20 ENCOUNTER — Non-Acute Institutional Stay (SKILLED_NURSING_FACILITY): Payer: Medicare Other | Admitting: Internal Medicine

## 2016-01-20 DIAGNOSIS — G4700 Insomnia, unspecified: Secondary | ICD-10-CM | POA: Diagnosis not present

## 2016-01-20 DIAGNOSIS — F329 Major depressive disorder, single episode, unspecified: Secondary | ICD-10-CM

## 2016-01-20 DIAGNOSIS — F32A Depression, unspecified: Secondary | ICD-10-CM

## 2016-01-20 DIAGNOSIS — F3170 Bipolar disorder, currently in remission, most recent episode unspecified: Secondary | ICD-10-CM | POA: Diagnosis not present

## 2016-01-20 NOTE — Assessment & Plan Note (Signed)
Pt is more stable than prior;plan to cont lexapro 10 mg daily
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
## 2016-01-20 NOTE — Assessment & Plan Note (Signed)
Doing well, socializing well, no problems with sleep;xont trazodonre 150 mg qHS

## 2016-01-20 NOTE — Assessment & Plan Note (Signed)
Controlled on trileptal and depakote; pt is doing well, getting along with others;plan to cont current meds

## 2016-01-30 ENCOUNTER — Other Ambulatory Visit (HOSPITAL_COMMUNITY): Payer: Self-pay | Admitting: Internal Medicine

## 2016-01-30 DIAGNOSIS — R1310 Dysphagia, unspecified: Secondary | ICD-10-CM

## 2016-02-06 ENCOUNTER — Ambulatory Visit (HOSPITAL_COMMUNITY)
Admission: RE | Admit: 2016-02-06 | Discharge: 2016-02-06 | Disposition: A | Discharge status: Home / Self Care | Payer: Medicare Other | Source: Ambulatory Visit | Attending: Internal Medicine | Admitting: Internal Medicine

## 2016-02-06 DIAGNOSIS — F319 Bipolar disorder, unspecified: Secondary | ICD-10-CM | POA: Insufficient documentation

## 2016-02-06 DIAGNOSIS — F988 Other specified behavioral and emotional disorders with onset usually occurring in childhood and adolescence: Secondary | ICD-10-CM | POA: Insufficient documentation

## 2016-02-06 DIAGNOSIS — Z8673 Personal history of transient ischemic attack (TIA), and cerebral infarction without residual deficits: Secondary | ICD-10-CM | POA: Diagnosis not present

## 2016-02-06 DIAGNOSIS — F419 Anxiety disorder, unspecified: Secondary | ICD-10-CM | POA: Diagnosis not present

## 2016-02-06 DIAGNOSIS — J45909 Unspecified asthma, uncomplicated: Secondary | ICD-10-CM | POA: Diagnosis not present

## 2016-02-06 DIAGNOSIS — R1313 Dysphagia, pharyngeal phase: Secondary | ICD-10-CM | POA: Insufficient documentation

## 2016-02-06 DIAGNOSIS — E782 Mixed hyperlipidemia: Secondary | ICD-10-CM | POA: Insufficient documentation

## 2016-02-06 DIAGNOSIS — R1310 Dysphagia, unspecified: Secondary | ICD-10-CM | POA: Diagnosis present

## 2016-02-06 DIAGNOSIS — E119 Type 2 diabetes mellitus without complications: Secondary | ICD-10-CM | POA: Diagnosis not present

## 2016-02-06 DIAGNOSIS — F329 Major depressive disorder, single episode, unspecified: Secondary | ICD-10-CM | POA: Diagnosis not present

## 2016-02-06 DIAGNOSIS — K449 Diaphragmatic hernia without obstruction or gangrene: Secondary | ICD-10-CM | POA: Diagnosis not present

## 2016-02-06 NOTE — Progress Notes (Signed)
MBSS complete. Full report located under chart review in imaging section.  Jayten Gabbard Paiewonsky, M.A. CCC-SLP [phone]  

## 2016-02-17 ENCOUNTER — Encounter: Payer: Self-pay | Admitting: Internal Medicine

## 2016-02-17 ENCOUNTER — Non-Acute Institutional Stay (SKILLED_NURSING_FACILITY): Payer: Medicare Other | Admitting: Internal Medicine

## 2016-02-17 DIAGNOSIS — F0280 Dementia in other diseases classified elsewhere without behavioral disturbance: Secondary | ICD-10-CM

## 2016-02-17 DIAGNOSIS — I10 Essential (primary) hypertension: Secondary | ICD-10-CM | POA: Diagnosis not present

## 2016-02-17 DIAGNOSIS — G300 Alzheimer's disease with early onset: Secondary | ICD-10-CM

## 2016-02-17 DIAGNOSIS — K219 Gastro-esophageal reflux disease without esophagitis: Secondary | ICD-10-CM
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
## 2016-03-08 ENCOUNTER — Encounter: Payer: Self-pay | Admitting: Internal Medicine

## 2016-03-08 NOTE — Assessment & Plan Note (Signed)
Controlled; cont lisinopril 10 mg daily and metoprolol XL 25 mg daily

## 2016-03-08 NOTE — Assessment & Plan Note (Signed)
No signs of symptoms of reflux; cont dexilant 60 mg daily; may be can d/c zantac; will monitor

## 2016-03-08 NOTE — Assessment & Plan Note (Signed)
Stable, no major declines; cont aricept 10 mg daily

## 2016-03-11 LAB — LIPID PANEL
CHOLESTEROL: 92 mg/dL (ref 0–200)
HDL: 29 mg/dL — AB (ref 35–70)
LDL Cholesterol: 37 mg/dL
Triglycerides: 129 mg/dL (ref 40–160)

## 2016-03-11 LAB — CBC AND DIFFERENTIAL
HCT: 42 % (ref 41–53)
Hemoglobin: 13.7 g/dL (ref 13.5–17.5)
Platelets: 190 10*3/uL (ref 150–399)
WBC: 8.2 10*3/mL

## 2016-03-11 LAB — HEPATIC FUNCTION PANEL
ALT: 15 U/L (ref 10–40)
AST: 16 U/L (ref 14–40)
Alkaline Phosphatase: 88 U/L (ref 25–125)
Bilirubin, Total: 0.3 mg/dL

## 2016-03-11 LAB — TSH: TSH: 2.74 u[IU]/mL (ref 0.41–5.90)

## 2016-03-11 LAB — BASIC METABOLIC PANEL
BUN: 14 mg/dL (ref 4–21)
CREATININE: 0.9 mg/dL (ref 0.6–1.3)
GLUCOSE: 244 mg/dL
POTASSIUM: 4.8 mmol/L (ref 3.4–5.3)
Sodium: 137 mmol/L (ref 137–147)

## 2016-03-11 LAB — PSA: PSA: 0.2

## 2016-03-11 LAB — HEMOGLOBIN A1C: HEMOGLOBIN A1C: 6.8

## 2016-03-17 ENCOUNTER — Encounter: Payer: Self-pay | Admitting: Internal Medicine

## 2016-03-17 ENCOUNTER — Non-Acute Institutional Stay (SKILLED_NURSING_FACILITY): Payer: Medicare Other | Admitting: Internal Medicine

## 2016-03-17 DIAGNOSIS — E119 Type 2 diabetes mellitus without complications: Secondary | ICD-10-CM | POA: Diagnosis not present

## 2016-03-17 DIAGNOSIS — D508 Other iron deficiency anemias: Secondary | ICD-10-CM | POA: Diagnosis not present

## 2016-03-17 DIAGNOSIS — Z794 Long term (current) use of insulin: Secondary | ICD-10-CM

## 2016-03-17 DIAGNOSIS — E782 Mixed hyperlipidemia: Secondary | ICD-10-CM

## 2016-03-17 NOTE — Assessment & Plan Note (Signed)
Hb 13.7 , stable from prior;plan to cont iron 325 mg daily

## 2016-03-17 NOTE — Assessment & Plan Note (Signed)
Controlled ;A1c was 6.7; plan to cont metformin 1000 mg BIDand lantus 22 u qHS wirh prn humalog; pt is on ace and statin
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
## 2016-03-17 NOTE — Assessment & Plan Note (Signed)
Controlled on lipitor 80 mg- HDL 29 and LDL 37; plan to cont current me

## 2016-03-25 DEATH — deceased

## 2016-04-20 ENCOUNTER — Non-Acute Institutional Stay (SKILLED_NURSING_FACILITY): Payer: Medicare Other | Admitting: Internal Medicine

## 2016-04-20 ENCOUNTER — Encounter: Payer: Self-pay | Admitting: Internal Medicine

## 2016-04-20 DIAGNOSIS — F5105 Insomnia due to other mental disorder: Secondary | ICD-10-CM

## 2016-04-20 DIAGNOSIS — F329 Major depressive disorder, single episode, unspecified: Secondary | ICD-10-CM

## 2016-04-20 DIAGNOSIS — F3170 Bipolar disorder, currently in remission, most recent episode unspecified: Secondary | ICD-10-CM | POA: Diagnosis not present

## 2016-04-20 DIAGNOSIS — F99 Mental disorder, not otherwise specified: Secondary | ICD-10-CM

## 2016-04-20 DIAGNOSIS — F32A Depression, unspecified: Secondary | ICD-10-CM
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
## 2016-04-26 ENCOUNTER — Encounter: Payer: Self-pay | Admitting: Internal Medicine

## 2016-04-26 NOTE — Assessment & Plan Note (Signed)
Pt is stable, no difficulties with interaction with others;plan to cont trileptal 600 mg daily and depakote 500 ER daily.

## 2016-04-26 NOTE — Assessment & Plan Note (Signed)
No reports of sleep difficulties;plan to cont trazodone 150 mg qHS

## 2016-04-26 NOTE — Assessment & Plan Note (Signed)
Stable;cont lexapro 10 mg daily

## 2016-05-22 ENCOUNTER — Non-Acute Institutional Stay (SKILLED_NURSING_FACILITY): Payer: Medicare Other | Admitting: Internal Medicine

## 2016-05-22 ENCOUNTER — Encounter: Payer: Self-pay | Admitting: Internal Medicine

## 2016-05-22 DIAGNOSIS — K219 Gastro-esophageal reflux disease without esophagitis: Secondary | ICD-10-CM

## 2016-05-22 DIAGNOSIS — F0280 Dementia in other diseases classified elsewhere without behavioral disturbance: Secondary | ICD-10-CM | POA: Diagnosis not present

## 2016-05-22 DIAGNOSIS — I10 Essential (primary) hypertension: Secondary | ICD-10-CM

## 2016-05-22 DIAGNOSIS — G300 Alzheimer's disease with early onset: Secondary | ICD-10-CM | POA: Diagnosis not present
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
## 2016-05-31 ENCOUNTER — Encounter: Payer: Self-pay | Admitting: Internal Medicine

## 2016-05-31 NOTE — Assessment & Plan Note (Signed)
CHRONIC AND STABLE WITHOUT MAJOR DECLINE;PLAN TO CONT ARICEPT 10 MG DAILY

## 2016-05-31 NOTE — Assessment & Plan Note (Signed)
Continues well controlled on lisinopril 10 mg daily and toprolXL 25 mg daily;plan cont cut=rrent meds

## 2016-05-31 NOTE — Assessment & Plan Note (Signed)
No signs ofr sx of reflux;pt has done fine off zantac; plan to cont dexilant 60 mg daily

## 2016-06-03 ENCOUNTER — Non-Acute Institutional Stay (SKILLED_NURSING_FACILITY): Payer: Medicare Other | Admitting: Internal Medicine

## 2016-06-03 ENCOUNTER — Encounter: Payer: Self-pay | Admitting: Internal Medicine

## 2016-06-03 DIAGNOSIS — R600 Localized edema: Secondary | ICD-10-CM | POA: Diagnosis not present
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
## 2016-06-07 ENCOUNTER — Encounter: Payer: Self-pay | Admitting: Internal Medicine

## 2016-06-18 ENCOUNTER — Non-Acute Institutional Stay (SKILLED_NURSING_FACILITY): Payer: Medicare Other | Admitting: Internal Medicine

## 2016-06-18 DIAGNOSIS — E782 Mixed hyperlipidemia: Secondary | ICD-10-CM | POA: Diagnosis not present

## 2016-06-18 DIAGNOSIS — E119 Type 2 diabetes mellitus without complications: Secondary | ICD-10-CM | POA: Diagnosis not present

## 2016-06-18 DIAGNOSIS — Z794 Long term (current) use of insulin: Secondary | ICD-10-CM

## 2016-06-18 DIAGNOSIS — D508 Other iron deficiency anemias: Secondary | ICD-10-CM | POA: Diagnosis not present

## 2016-06-22 LAB — TSH: TSH: 2.2 u[IU]/mL (ref 0.41–5.90)

## 2016-06-23 ENCOUNTER — Non-Acute Institutional Stay (SKILLED_NURSING_FACILITY): Payer: Medicare Other | Admitting: Internal Medicine

## 2016-06-23 ENCOUNTER — Encounter: Payer: Self-pay | Admitting: Internal Medicine

## 2016-06-23 DIAGNOSIS — R69 Illness, unspecified: Secondary | ICD-10-CM | POA: Diagnosis not present
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
## 2016-06-24 ENCOUNTER — Encounter: Payer: Self-pay | Admitting: Internal Medicine

## 2016-06-24 ENCOUNTER — Non-Acute Institutional Stay (SKILLED_NURSING_FACILITY): Payer: Medicare Other | Admitting: Internal Medicine

## 2016-06-24 DIAGNOSIS — J101 Influenza due to other identified influenza virus with other respiratory manifestations: Secondary | ICD-10-CM | POA: Diagnosis not present

## 2016-06-24 LAB — HEPATIC FUNCTION PANEL
ALT: 11 U/L (ref 10–40)
AST: 14 U/L (ref 14–40)
Alkaline Phosphatase: 85 U/L (ref 25–125)
Bilirubin, Total: 0.3 mg/dL

## 2016-06-24 LAB — BASIC METABOLIC PANEL
BUN: 19 mg/dL (ref 4–21)
CREATININE: 0.8 mg/dL (ref 0.6–1.3)
GLUCOSE: 111 mg/dL
Potassium: 4.1 mmol/L (ref 3.4–5.3)
Sodium: 144 mmol/L (ref 137–147)

## 2016-06-24 LAB — CBC AND DIFFERENTIAL
HCT: 39 % — AB (ref 41–53)
HEMOGLOBIN: 12.9 g/dL — AB (ref 13.5–17.5)
PLATELETS: 111 10*3/uL — AB (ref 150–399)
WBC: 5.1 10*3/mL
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
## 2016-06-28 ENCOUNTER — Encounter: Payer: Self-pay | Admitting: Internal Medicine

## 2016-06-30 ENCOUNTER — Encounter: Payer: Self-pay | Admitting: Internal Medicine
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
## 2016-07-04 ENCOUNTER — Encounter: Payer: Self-pay | Admitting: Internal Medicine

## 2016-07-04 NOTE — Assessment & Plan Note (Signed)
Hb 13.7 , stable;plan to cont iron 325 mg daily

## 2016-07-04 NOTE — Assessment & Plan Note (Signed)
A1c 6.8; very consistent; plan to cont current regimen of glucophage 1000 mg BID and lantus 22u qHS; pt is on ACE and statin

## 2016-07-04 NOTE — Assessment & Plan Note (Signed)
LDL 37, HDL 29 on lipitor 80 mg; no plan to change regimen

## 2016-07-20 ENCOUNTER — Encounter: Payer: Self-pay | Admitting: Internal Medicine

## 2016-07-20 ENCOUNTER — Non-Acute Institutional Stay (SKILLED_NURSING_FACILITY): Payer: Medicare Other | Admitting: Internal Medicine

## 2016-07-20 DIAGNOSIS — F3170 Bipolar disorder, currently in remission, most recent episode unspecified: Secondary | ICD-10-CM | POA: Diagnosis not present

## 2016-07-20 DIAGNOSIS — F5105 Insomnia due to other mental disorder: Secondary | ICD-10-CM | POA: Diagnosis not present

## 2016-07-20 DIAGNOSIS — F99 Mental disorder, not otherwise specified: Secondary | ICD-10-CM

## 2016-07-20 DIAGNOSIS — F329 Major depressive disorder, single episode, unspecified: Secondary | ICD-10-CM | POA: Diagnosis not present

## 2016-07-20 DIAGNOSIS — F32A Depression, unspecified: Secondary | ICD-10-CM
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
## 2016-08-01 ENCOUNTER — Encounter: Payer: Self-pay | Admitting: Internal Medicine

## 2016-08-01 NOTE — Assessment & Plan Note (Signed)
Stable, interacting well with others;plan to cont trileptal 600 mg daily and depakote XR 500 mg daily

## 2016-08-01 NOTE — Assessment & Plan Note (Signed)
No c/o problems; cont trazodone 150mg  qHS

## 2016-08-01 NOTE — Assessment & Plan Note (Signed)
Stable;cont lexapro 10 mg daily 

## 2016-08-12 LAB — HM DIABETES FOOT EXAM

## 2016-08-19 ENCOUNTER — Non-Acute Institutional Stay (SKILLED_NURSING_FACILITY): Payer: Medicare Other | Admitting: Internal Medicine

## 2016-08-19 ENCOUNTER — Encounter: Payer: Self-pay | Admitting: Internal Medicine

## 2016-08-19 DIAGNOSIS — F0280 Dementia in other diseases classified elsewhere without behavioral disturbance: Secondary | ICD-10-CM

## 2016-08-19 DIAGNOSIS — G300 Alzheimer's disease with early onset: Secondary | ICD-10-CM | POA: Diagnosis not present

## 2016-08-19 DIAGNOSIS — I10 Essential (primary) hypertension: Secondary | ICD-10-CM | POA: Diagnosis not present

## 2016-08-19 DIAGNOSIS — K219 Gastro-esophageal reflux disease without esophagitis: Secondary | ICD-10-CM
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
## 2016-09-16 ENCOUNTER — Encounter: Payer: Self-pay | Admitting: Internal Medicine

## 2016-09-16 NOTE — Assessment & Plan Note (Signed)
No reported reflux or aspiration;plan to cont dexilant 60 mg daily

## 2016-09-16 NOTE — Assessment & Plan Note (Signed)
Chromic and stable; cont lisinopril 10 mg dailt and toprol XL 25 mg po BID,

## 2016-09-16 NOTE — Assessment & Plan Note (Signed)
Chronic and stable without any declines ;plan to cont aricept 10 mg daily

## 2016-09-21 ENCOUNTER — Non-Acute Institutional Stay (SKILLED_NURSING_FACILITY): Payer: Medicare Other | Admitting: Internal Medicine

## 2016-09-21 ENCOUNTER — Encounter: Payer: Self-pay | Admitting: Internal Medicine

## 2016-09-21 DIAGNOSIS — Z794 Long term (current) use of insulin: Secondary | ICD-10-CM | POA: Diagnosis not present

## 2016-09-21 DIAGNOSIS — F3170 Bipolar disorder, currently in remission, most recent episode unspecified: Secondary | ICD-10-CM

## 2016-09-21 DIAGNOSIS — E119 Type 2 diabetes mellitus without complications: Secondary | ICD-10-CM | POA: Diagnosis not present

## 2016-09-21 DIAGNOSIS — D508 Other iron deficiency anemias: Secondary | ICD-10-CM
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
## 2016-10-18 ENCOUNTER — Encounter: Payer: Self-pay | Admitting: Internal Medicine

## 2016-10-18 NOTE — Assessment & Plan Note (Signed)
Well controlled;plan to cont glucophage 1000 mg BID and lantus 22 u q HS; pt is on ACE and statin

## 2016-10-18 NOTE — Assessment & Plan Note (Signed)
Chronic and stable on trileptal 600mg  daily and depakote XR 500 mg daily; will cont current regimen

## 2016-10-18 NOTE — Assessment & Plan Note (Signed)
Hb 12.9, slt down from prior; plan to cont iron and will monitor at intervals

## 2016-10-21 ENCOUNTER — Non-Acute Institutional Stay (SKILLED_NURSING_FACILITY): Payer: Medicare Other | Admitting: Internal Medicine

## 2016-10-21 ENCOUNTER — Encounter: Payer: Self-pay | Admitting: Internal Medicine

## 2016-10-21 DIAGNOSIS — F0280 Dementia in other diseases classified elsewhere without behavioral disturbance: Secondary | ICD-10-CM

## 2016-10-21 DIAGNOSIS — F99 Mental disorder, not otherwise specified: Secondary | ICD-10-CM

## 2016-10-21 DIAGNOSIS — F329 Major depressive disorder, single episode, unspecified: Secondary | ICD-10-CM | POA: Diagnosis not present

## 2016-10-21 DIAGNOSIS — G300 Alzheimer's disease with early onset: Secondary | ICD-10-CM

## 2016-10-21 DIAGNOSIS — F32A Depression, unspecified: Secondary | ICD-10-CM

## 2016-10-21 DIAGNOSIS — F5105 Insomnia due to other mental disorder: Secondary | ICD-10-CM
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
## 2016-11-11 LAB — HEMOGLOBIN A1C: HEMOGLOBIN A1C: 6.9

## 2016-11-20 ENCOUNTER — Non-Acute Institutional Stay (SKILLED_NURSING_FACILITY): Payer: Medicare Other | Admitting: Internal Medicine

## 2016-11-20 ENCOUNTER — Encounter: Payer: Self-pay | Admitting: Internal Medicine

## 2016-11-20 DIAGNOSIS — I10 Essential (primary) hypertension: Secondary | ICD-10-CM

## 2016-11-20 DIAGNOSIS — K219 Gastro-esophageal reflux disease without esophagitis: Secondary | ICD-10-CM

## 2016-11-20 DIAGNOSIS — Z794 Long term (current) use of insulin: Secondary | ICD-10-CM

## 2016-11-20 DIAGNOSIS — E119 Type 2 diabetes mellitus without complications: Secondary | ICD-10-CM | POA: Diagnosis not present
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
## 2016-12-05 ENCOUNTER — Encounter: Payer: Self-pay | Admitting: Internal Medicine

## 2016-12-05 NOTE — Assessment & Plan Note (Signed)
Nursing reports no problems; continue trazodone 150 mg by mouth daily at bedtime

## 2016-12-05 NOTE — Assessment & Plan Note (Signed)
Stable, but no declines; continue Aricept 10 mg by mouth daily

## 2016-12-05 NOTE — Assessment & Plan Note (Signed)
Continue stable; continue Lexapro 10 mg by mouth daily

## 2016-12-08 LAB — CBC AND DIFFERENTIAL
HCT: 43 (ref 41–53)
Hemoglobin: 14.7 (ref 13.5–17.5)
Platelets: 159 (ref 150–399)
WBC: 6.4

## 2016-12-08 LAB — HEMOGLOBIN A1C: Hemoglobin A1C: 6.8

## 2016-12-09 LAB — VITAMIN D 25 HYDROXY (VIT D DEFICIENCY, FRACTURES): VIT D 25 HYDROXY: 15.58

## 2016-12-09 LAB — BASIC METABOLIC PANEL
BUN: 14 (ref 4–21)
Creatinine: 0.9 (ref 0.6–1.3)
GLUCOSE: 229
POTASSIUM: 4 (ref 3.4–5.3)
SODIUM: 139 (ref 137–147)

## 2016-12-09 LAB — TSH: TSH: 4.11 (ref 0.41–5.90)

## 2016-12-09 LAB — LIPID PANEL
Cholesterol: 103 (ref 0–200)
HDL: 34 — AB (ref 35–70)
LDL CALC: 41
Triglycerides: 139 (ref 40–160)

## 2016-12-09 LAB — HEPATIC FUNCTION PANEL
ALT: 16 (ref 10–40)
AST: 17 (ref 14–40)
Alkaline Phosphatase: 117 (ref 25–125)
Bilirubin, Total: 0.2

## 2016-12-09 LAB — PSA: PSA: 0.31

## 2016-12-11 ENCOUNTER — Encounter: Payer: Self-pay | Admitting: Internal Medicine

## 2016-12-11 NOTE — Assessment & Plan Note (Signed)
No reported aspiration or reflux; continue dexilant 60 mg by mouth daily

## 2016-12-11 NOTE — Assessment & Plan Note (Signed)
Controlled, continue Toprol-XL 25 mg daily and lisinopril 10 mg by mouth daily

## 2016-12-11 NOTE — Assessment & Plan Note (Signed)
Hemoglobin A1c done in June was 6.9-good control for him; plan to continue Lantus 22 units by mouth daily at bedtime and Glucophage 1000 mg by mouth twice a day; patient is on ACE and on statin

## 2016-12-16 ENCOUNTER — Other Ambulatory Visit: Payer: Self-pay

## 2016-12-17 ENCOUNTER — Non-Acute Institutional Stay (SKILLED_NURSING_FACILITY): Payer: Medicare Other | Admitting: Internal Medicine

## 2016-12-17 ENCOUNTER — Encounter: Payer: Self-pay | Admitting: Internal Medicine

## 2016-12-17 DIAGNOSIS — E559 Vitamin D deficiency, unspecified: Secondary | ICD-10-CM | POA: Diagnosis not present

## 2016-12-17 DIAGNOSIS — F3170 Bipolar disorder, currently in remission, most recent episode unspecified: Secondary | ICD-10-CM

## 2016-12-17 DIAGNOSIS — E782 Mixed hyperlipidemia: Secondary | ICD-10-CM | POA: Diagnosis not present
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
## 2016-12-31 ENCOUNTER — Non-Acute Institutional Stay (SKILLED_NURSING_FACILITY): Payer: Medicare Other

## 2016-12-31 DIAGNOSIS — Z0000 Encounter for general adult medical examination without abnormal findings: Secondary | ICD-10-CM
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
## 2016-12-31 NOTE — Patient Instructions (Signed)
[name] , Thank you for taking time to come for your Medicare Wellness Visit. I appreciate your ongoing commitment to your health goals. Please review the following plan we discussed and let me know if I can assist you in the future.   Screening recommendations/referrals: Colonoscopy excluded, long term pt Recommended yearly ophthalmology/optometry visit for glaucoma screening and checkup Recommended yearly dental visit for hygiene and checkup  Vaccinations: Influenza vaccine due 2018 fall season Pneumococcal vaccine up to date Tdap vaccine due 03/20/22 Shingles vaccine not in records  Advanced directives: In Chart  Conditions/risks identified: None  Next appointment: Dr. Lyn [name] makes rounds  Preventive Care 65 Years and Older, Male Preventive care refers to lifestyle choices and visits with your health care provider that can promote health and wellness. What does preventive care include?  A yearly physical exam. This is also called an annual well check.  Dental exams once or twice a year.  Routine eye exams. Ask your health care provider how often you should have your eyes checked.  Personal lifestyle choices, including:  Daily care of your teeth and gums.  Regular physical activity.  Eating a healthy diet.  Avoiding tobacco and drug use.  Limiting alcohol use.  Practicing safe sex.  Taking low doses of aspirin every day.  Taking vitamin and mineral supplements as recommended by your health care provider. What happens during an annual well check? The services and screenings done by your health care provider during your annual well check will depend on your age, overall health, lifestyle risk factors, and family history of disease. Counseling  Your health care provider may ask you questions about your:  Alcohol use.  Tobacco use.  Drug use.  Emotional well-being.  Home and relationship well-being.  Sexual activity.  Eating habits.  History of  falls.  Memory and ability to understand (cognition).  Work and work Astronomerenvironment. Screening  You may have the following tests or measurements:  Height, weight, and BMI.  Blood pressure.  Lipid and cholesterol levels. These may be checked every 5 years, or more frequently if you are over 255 years old.  Skin check.  Lung cancer screening. You may have this screening every year starting at age 68 if you have a 30-pack-year history of smoking and currently smoke or have quit within the past 15 years.  Fecal occult blood test (FOBT) of the stool. You may have this test every year starting at age 68.  Flexible sigmoidoscopy or colonoscopy. You may have a sigmoidoscopy every 5 years or a colonoscopy every 10 years starting at age 68.  Prostate cancer screening. Recommendations will vary depending on your family history and other risks.  Hepatitis C blood test.  Hepatitis B blood test.  Sexually transmitted disease (STD) testing.  Diabetes screening. This is done by checking your blood sugar (glucose) after you have not eaten for a while (fasting). You may have this done every 1-3 years.  Abdominal aortic aneurysm (AAA) screening. You may need this if you are a current or former smoker.  Osteoporosis. You may be screened starting at age 68 if you are at high risk. Talk with your health care provider about your test results, treatment options, and if necessary, the need for more tests. Vaccines  Your health care provider may recommend certain vaccines, such as:  Influenza vaccine. This is recommended every year.  Tetanus, diphtheria, and acellular pertussis (Tdap, Td) vaccine. You may need a Td booster every 10 years.  Zoster vaccine. You may  need this after age 1.  Pneumococcal 13-valent conjugate (PCV13) vaccine. One dose is recommended after age 38.  Pneumococcal polysaccharide (PPSV23) vaccine. One dose is recommended after age 26. Talk to your health care provider about  which screenings and vaccines you need and how often you need them. This information is not intended to replace advice given to you by your health care provider. Make sure you discuss any questions you have with your health care provider. Document Released: 06/07/2015 Document Revised: 01/29/2016 Document Reviewed: 03/12/2015 Elsevier Interactive Patient Education  2017 [employer] Prevention in the Home Falls can cause injuries. They can happen to people of all ages. There are many things you can do to make your home safe and to help prevent falls. What can I do on the outside of my home?  Regularly fix the edges of walkways and driveways and fix any cracks.  Remove anything that might make you trip as you walk through a door, such as a raised step or threshold.  Trim any bushes or trees on the path to your home.  Use bright outdoor lighting.  Clear any walking paths of anything that might make someone trip, such as rocks or tools.  Regularly check to see if handrails are loose or broken. Make sure that both sides of any steps have handrails.  Any raised decks and porches should have guardrails on the edges.  Have any leaves, snow, or ice cleared regularly.  Use sand or salt on walking paths during winter.  Clean up any spills in your garage right away. This includes oil or grease spills. What can I do in the bathroom?  Use night lights.  Install grab bars by the toilet and in the tub and shower. Do not use towel bars as grab bars.  Use non-skid mats or decals in the tub or shower.  If you need to sit down in the shower, use a plastic, non-slip stool.  Keep the floor dry. Clean up any water that spills on the floor as soon as it happens.  Remove soap buildup in the tub or shower regularly.  Attach bath mats securely with double-sided non-slip rug tape.  Do not have throw rugs and other things on the floor that can make you trip. What can I do in the  bedroom?  Use night lights.  Make sure that you have a light by your bed that is easy to reach.  Do not use any sheets or blankets that are too big for your bed. They should not hang down onto the floor.  Have a firm chair that has side arms. You can use this for support while you get dressed.  Do not have throw rugs and other things on the floor that can make you trip. What can I do in the kitchen?  Clean up any spills right away.  Avoid walking on wet floors.  Keep items that you use a lot in easy-to-reach places.  If you need to reach something above you, use a strong step stool that has a grab bar.  Keep electrical cords out of the way.  Do not use floor polish or wax that makes floors slippery. If you must use wax, use non-skid floor wax.  Do not have throw rugs and other things on the floor that can make you trip. What can I do with my stairs?  Do not leave any items on the stairs.  Make sure that there are handrails on both sides  of the stairs and use them. Fix handrails that are broken or loose. Make sure that handrails are as long as the stairways.  Check any carpeting to make sure that it is firmly attached to the stairs. Fix any carpet that is loose or worn.  Avoid having throw rugs at the top or bottom of the stairs. If you do have throw rugs, attach them to the floor with carpet tape.  Make sure that you have a light switch at the top of the stairs and the bottom of the stairs. If you do not have them, ask someone to add them for you. What else can I do to help prevent falls?  Wear shoes that:  Do not have high heels.  Have rubber bottoms.  Are comfortable and fit you well.  Are closed at the toe. Do not wear sandals.  If you use a stepladder:  Make sure that it is fully opened. Do not climb a closed stepladder.  Make sure that both sides of the stepladder are locked into place.  Ask someone to hold it for you, if possible.  Clearly mark and make  sure that you can see:  Any grab bars or handrails.  First and last steps.  Where the edge of each step is.  Use tools that help you move around (mobility aids) if they are needed. These include:  Canes.  Walkers.  Scooters.  Crutches.  Turn on the lights when you go into a dark area. Replace any light bulbs as soon as they burn out.  Set up your furniture so you have a clear path. Avoid moving your furniture around.  If any of your floors are uneven, fix them.  If there are any pets around you, be aware of where they are.  Review your medicines with your doctor. Some medicines can make you feel dizzy. This can increase your chance of falling. Ask your doctor what other things that you can do to help prevent falls. This information is not intended to replace advice given to you by your health care provider. Make sure you discuss any questions you have with your health care provider. Document Released: 03/07/2009 Document Revised: 10/17/2015 Document Reviewed: 06/15/2014 Elsevier Interactive Patient Education  2017 [employer].

## 2017-01-01 LAB — HM HEPATITIS C SCREENING LAB: HM HEPATITIS C SCREENING: NEGATIVE

## 2017-01-12 ENCOUNTER — Encounter: Payer: Self-pay | Admitting: Internal Medicine

## 2017-01-12 DIAGNOSIS — E559 Vitamin D deficiency, unspecified: Secondary | ICD-10-CM | POA: Insufficient documentation

## 2017-01-12 NOTE — Assessment & Plan Note (Signed)
Most recent LDL is 41, HDL 34, triglycerides 139; plan to continue Lipitor at its reduced dose of 40 mg by mouth daily

## 2017-01-12 NOTE — Assessment & Plan Note (Signed)
On routine labs patient's vitamin D level was 15.58; have started 50,000 units weekly for 16 weeks then redraw vitamin D level

## 2017-01-12 NOTE — Assessment & Plan Note (Signed)
Stable and chronic; continue Trileptal 600 mg daily and Depakote X are 500 mg daily; will continue to monitor

## 2017-01-20 ENCOUNTER — Encounter: Payer: Self-pay | Admitting: Internal Medicine

## 2017-01-20 ENCOUNTER — Non-Acute Institutional Stay (SKILLED_NURSING_FACILITY): Payer: Medicare Other | Admitting: Internal Medicine

## 2017-01-20 DIAGNOSIS — F329 Major depressive disorder, single episode, unspecified: Secondary | ICD-10-CM

## 2017-01-20 DIAGNOSIS — F32A Depression, unspecified: Secondary | ICD-10-CM

## 2017-01-20 DIAGNOSIS — G300 Alzheimer's disease with early onset: Secondary | ICD-10-CM

## 2017-01-20 DIAGNOSIS — E119 Type 2 diabetes mellitus without complications: Secondary | ICD-10-CM

## 2017-01-20 DIAGNOSIS — F0280 Dementia in other diseases classified elsewhere without behavioral disturbance: Secondary | ICD-10-CM | POA: Diagnosis not present

## 2017-01-20 DIAGNOSIS — Z794 Long term (current) use of insulin: Secondary | ICD-10-CM | POA: Diagnosis not present
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
## 2017-02-05 ENCOUNTER — Encounter: Payer: Self-pay | Admitting: Internal Medicine

## 2017-02-05 NOTE — Assessment & Plan Note (Signed)
Can control; A1c 6.9; plan to continue Glucophage 1000 mg by mouth twice a day and Lantus 22 units subcutaneous daily at bedtime; patient is on statin and ACE

## 2017-02-05 NOTE — Assessment & Plan Note (Signed)
Stable; continue Lexapro 10 mg by mouth daily at bedtime and does well 100 mg by mouth daily at bedtime

## 2017-02-05 NOTE — Assessment & Plan Note (Signed)
Stable without major declines; continue Aricept 10 mg by mouth daily

## 2017-02-19 ENCOUNTER — Encounter: Payer: Self-pay | Admitting: Internal Medicine

## 2017-02-19 ENCOUNTER — Non-Acute Institutional Stay (SKILLED_NURSING_FACILITY): Payer: Medicare Other | Admitting: Internal Medicine

## 2017-02-19 DIAGNOSIS — F5105 Insomnia due to other mental disorder: Secondary | ICD-10-CM | POA: Diagnosis not present

## 2017-02-19 DIAGNOSIS — K219 Gastro-esophageal reflux disease without esophagitis: Secondary | ICD-10-CM

## 2017-02-19 DIAGNOSIS — I10 Essential (primary) hypertension: Secondary | ICD-10-CM | POA: Diagnosis not present

## 2017-02-19 DIAGNOSIS — F99 Mental disorder, not otherwise specified: Secondary | ICD-10-CM | POA: Diagnosis not present
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
## 2017-03-23 ENCOUNTER — Encounter: Payer: Self-pay | Admitting: Internal Medicine

## 2017-03-23 ENCOUNTER — Non-Acute Institutional Stay (SKILLED_NURSING_FACILITY): Payer: Medicare Other | Admitting: Internal Medicine

## 2017-03-23 DIAGNOSIS — E559 Vitamin D deficiency, unspecified: Secondary | ICD-10-CM | POA: Diagnosis not present

## 2017-03-23 DIAGNOSIS — D508 Other iron deficiency anemias: Secondary | ICD-10-CM

## 2017-03-23 DIAGNOSIS — F3170 Bipolar disorder, currently in remission, most recent episode unspecified: Secondary | ICD-10-CM | POA: Diagnosis not present
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
## 2017-04-06 ENCOUNTER — Encounter: Payer: Self-pay | Admitting: Internal Medicine

## 2017-04-06 NOTE — Assessment & Plan Note (Signed)
Controlled; continue lisinopril 10 mg by mouth daily and Toprol-XL 25 mg by mouth daily

## 2017-04-06 NOTE — Assessment & Plan Note (Signed)
Patient continues get 50,000 units weekly; new vitamin D level due this month

## 2017-04-06 NOTE — Assessment & Plan Note (Signed)
o problems reported per nursing; plan to continue trazodone 150 mg by mouth daily at bedtime

## 2017-04-06 NOTE — Assessment & Plan Note (Signed)
Chronic and stable; continue Trileptal 600 mg by mouth dailynd Depakote X are 500 mg by mouth daily

## 2017-04-06 NOTE — Assessment & Plan Note (Signed)
No reports of reflux; continued excellent 60 mg by mouth every morning

## 2017-04-21 ENCOUNTER — Non-Acute Institutional Stay (SKILLED_NURSING_FACILITY): Payer: Medicare Other | Admitting: Internal Medicine

## 2017-04-21 ENCOUNTER — Encounter: Payer: Self-pay | Admitting: Internal Medicine

## 2017-04-21 DIAGNOSIS — F329 Major depressive disorder, single episode, unspecified: Secondary | ICD-10-CM | POA: Diagnosis not present

## 2017-04-21 DIAGNOSIS — Z794 Long term (current) use of insulin: Secondary | ICD-10-CM | POA: Diagnosis not present

## 2017-04-21 DIAGNOSIS — F32A Depression, unspecified: Secondary | ICD-10-CM

## 2017-04-21 DIAGNOSIS — G300 Alzheimer's disease with early onset: Secondary | ICD-10-CM

## 2017-04-21 DIAGNOSIS — F0280 Dementia in other diseases classified elsewhere without behavioral disturbance: Secondary | ICD-10-CM

## 2017-04-21 DIAGNOSIS — E119 Type 2 diabetes mellitus without complications: Secondary | ICD-10-CM
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
## 2017-04-23 ENCOUNTER — Encounter: Payer: Self-pay | Admitting: Internal Medicine

## 2017-04-23 NOTE — Assessment & Plan Note (Signed)
Stable; continue Lexapro 10 mg by mouth daily and trazodone 100 mg by mouth daily at bedtime

## 2017-04-23 NOTE — Assessment & Plan Note (Signed)
A1c is 6.8 which is stable from prior and very good; plan to continue metformin 1000 mg by mouth twice a day, and Lantus insulin 20 units subcutaneous daily at bedtime; patient is on statin and on ACE

## 2017-04-23 NOTE — Assessment & Plan Note (Signed)
Stable chronic without major declines; continue Aricept 10 mg by mouth daily

## 2017-05-24 ENCOUNTER — Encounter: Payer: Self-pay | Admitting: Internal Medicine

## 2017-05-24 ENCOUNTER — Non-Acute Institutional Stay (SKILLED_NURSING_FACILITY): Payer: Medicare Other | Admitting: Internal Medicine

## 2017-05-24 DIAGNOSIS — F3170 Bipolar disorder, currently in remission, most recent episode unspecified: Secondary | ICD-10-CM

## 2017-05-24 DIAGNOSIS — E782 Mixed hyperlipidemia: Secondary | ICD-10-CM | POA: Diagnosis not present

## 2017-05-24 DIAGNOSIS — F5105 Insomnia due to other mental disorder: Secondary | ICD-10-CM | POA: Diagnosis not present

## 2017-05-24 DIAGNOSIS — F99 Mental disorder, not otherwise specified: Secondary | ICD-10-CM
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
## 2017-05-30 ENCOUNTER — Encounter: Payer: Self-pay | Admitting: Internal Medicine

## 2017-05-30 NOTE — Assessment & Plan Note (Signed)
Controlled; continue Lipitor 40 mg by mouth daily; have ordered new fasting lipid panel

## 2017-05-30 NOTE — Assessment & Plan Note (Signed)
Chronic and stable; continue Depakote 500 mg by mouth daily and Trileptal to Center milligrams by mouth daily

## 2017-05-30 NOTE — Assessment & Plan Note (Signed)
No reported problems; continue trazodone 150 mg by mouth daily at bedtime

## 2017-06-01 LAB — LIPID PANEL
Cholesterol: 91 (ref 0–200)
HDL: 35 (ref 35–70)
LDL Cholesterol: 43
Triglycerides: 66 (ref 40–160)

## 2017-06-01 LAB — BASIC METABOLIC PANEL
BUN: 15 (ref 4–21)
CREATININE: 0.7 (ref 0.6–1.3)
GLUCOSE: 135
POTASSIUM: 4.4 (ref 3.4–5.3)
Sodium: 141 (ref 137–147)

## 2017-06-01 LAB — HEPATIC FUNCTION PANEL
ALK PHOS: 102 (ref 25–125)
ALT: 13 (ref 10–40)
AST: 15 (ref 14–40)
Bilirubin, Total: 0.3

## 2017-06-01 LAB — CBC AND DIFFERENTIAL
HCT: 39 — AB (ref 41–53)
HEMOGLOBIN: 13.1 — AB (ref 13.5–17.5)
Platelets: 173 (ref 150–399)
WBC: 7.3

## 2017-06-01 LAB — TSH: TSH: 3.32 (ref 0.41–5.90)

## 2017-06-01 LAB — HEMOGLOBIN A1C: Hemoglobin A1C: 6.5

## 2017-06-23 ENCOUNTER — Encounter: Payer: Self-pay | Admitting: Internal Medicine

## 2017-06-23 ENCOUNTER — Non-Acute Institutional Stay (SKILLED_NURSING_FACILITY): Payer: Medicare Other | Admitting: Internal Medicine

## 2017-06-23 DIAGNOSIS — I639 Cerebral infarction, unspecified: Secondary | ICD-10-CM | POA: Diagnosis not present

## 2017-06-23 DIAGNOSIS — I2510 Atherosclerotic heart disease of native coronary artery without angina pectoris: Secondary | ICD-10-CM | POA: Diagnosis not present

## 2017-06-23 DIAGNOSIS — I10 Essential (primary) hypertension: Secondary | ICD-10-CM | POA: Diagnosis not present
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
## 2017-06-25 ENCOUNTER — Other Ambulatory Visit (HOSPITAL_COMMUNITY): Payer: Self-pay | Admitting: Internal Medicine

## 2017-06-25 DIAGNOSIS — R1310 Dysphagia, unspecified: Secondary | ICD-10-CM

## 2017-06-27 ENCOUNTER — Encounter: Payer: Self-pay | Admitting: Internal Medicine

## 2017-06-27 NOTE — Assessment & Plan Note (Signed)
No complaint of chest pain; continue aspirin 81 mg by mouth daily, Toprol-XL 25 mg by mouth twice a day; patient is on statin

## 2017-06-27 NOTE — Assessment & Plan Note (Signed)
With resultant mild right hemiparesis unchanged; continue ASA 81 mg by mouth daily; patient is on statin, patient's A1c 6.5

## 2017-06-27 NOTE — Assessment & Plan Note (Signed)
Controlled; continue Toprol-XL 25 mg twice a day and Norvasc 10 mg by mouth daily

## 2017-07-06 ENCOUNTER — Ambulatory Visit (HOSPITAL_COMMUNITY)
Admission: RE | Admit: 2017-07-06 | Discharge: 2017-07-06 | Disposition: A | Discharge status: Home / Self Care | Payer: Medicare Other | Source: Ambulatory Visit | Attending: Internal Medicine | Admitting: Internal Medicine

## 2017-07-06 DIAGNOSIS — R1310 Dysphagia, unspecified: Secondary | ICD-10-CM | POA: Insufficient documentation

## 2017-07-07 NOTE — Progress Notes (Signed)
Modified Barium Swallow Progress Note  Patient Details  Name: Lorriane ShireCharles E Klinke Jr. MRN: [id number] Date of Birth: 1951-03-28  Today's Date: 07/07/2017  Modified Barium Swallow completed.  Full report located under Chart Review in the Imaging Section.  Brief recommendations include the following:  Clinical Impression  Pt continues to demonstrate a moderate to severe dyspahgia with laborious bolus formation and premautre spillage, though mastication of soft solids is thorough. There is pooling of liquids in the valleculae and pyriforms prior to swallow initiation and moderate residuals post swallow as well.  Duration of laryngeal closure and hyoid excursion for complete bolus propulsion is brief. Liquid residuals mix with secretions and lead to silent trace aspiration post swallow with thin, nectar and honey. Pt can be cued to clear his throat to eject aspirate, but cannot elicit a second swallow to fully clear due to recpetive language/cognitive impairment. Would suggest advancing pts diet considering adquate ability to masticate soft solids as well as equal risk of aspiration across liquid consistencies due to residue.       Swallow Evaluation Recommendations  Per primary SLP                                    Dorsie Sethi, Riley NearingBonnie Caroline 07/07/2017,8:44 AM

## 2017-07-22 ENCOUNTER — Non-Acute Institutional Stay (SKILLED_NURSING_FACILITY): Payer: Medicare Other | Admitting: Internal Medicine

## 2017-07-22 DIAGNOSIS — E119 Type 2 diabetes mellitus without complications: Secondary | ICD-10-CM

## 2017-07-22 DIAGNOSIS — Z794 Long term (current) use of insulin: Secondary | ICD-10-CM | POA: Diagnosis not present

## 2017-07-22 DIAGNOSIS — K219 Gastro-esophageal reflux disease without esophagitis: Secondary | ICD-10-CM

## 2017-07-22 DIAGNOSIS — E559 Vitamin D deficiency, unspecified: Secondary | ICD-10-CM

## 2017-07-25 ENCOUNTER — Encounter: Payer: Self-pay | Admitting: Internal Medicine

## 2017-07-25 NOTE — Assessment & Plan Note (Signed)
Most recent vitamin D level 31.88; will start treating with vitamin D thousand units daily and DC weekly vitamin D

## 2017-07-25 NOTE — Assessment & Plan Note (Signed)
A1c is 6.5; continue Lantus 22 units of bedtime, sliding scale insulin with meals and Glucophage 1000 mg twice a day; patient is on statin, patient is on ACE

## 2017-07-25 NOTE — Assessment & Plan Note (Signed)
No complaint of; continue dexilant 60 mg daily
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
## 2017-08-17 ENCOUNTER — Non-Acute Institutional Stay (SKILLED_NURSING_FACILITY): Payer: Medicare Other | Admitting: Internal Medicine

## 2017-08-17 ENCOUNTER — Encounter: Payer: Self-pay | Admitting: Internal Medicine

## 2017-08-17 DIAGNOSIS — F329 Major depressive disorder, single episode, unspecified: Secondary | ICD-10-CM

## 2017-08-17 DIAGNOSIS — F32A Depression, unspecified: Secondary | ICD-10-CM

## 2017-08-17 DIAGNOSIS — F0280 Dementia in other diseases classified elsewhere without behavioral disturbance: Secondary | ICD-10-CM

## 2017-08-17 DIAGNOSIS — G300 Alzheimer's disease with early onset: Secondary | ICD-10-CM

## 2017-08-17 DIAGNOSIS — E782 Mixed hyperlipidemia: Secondary | ICD-10-CM
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
## 2017-08-21 ENCOUNTER — Encounter: Payer: Self-pay | Admitting: Internal Medicine

## 2017-08-21 NOTE — Assessment & Plan Note (Signed)
Stable; in fact appears improved; continue Lexapro 10 mg daily and trazodone at a reduced dose of 75 mg daily

## 2017-08-21 NOTE — Assessment & Plan Note (Signed)
HDL 35, LDL 43, excellent control; continue Lipitor 40 mg daily

## 2017-08-21 NOTE — Assessment & Plan Note (Signed)
No declines; continue Aricept 10 mg daily

## 2017-09-21 ENCOUNTER — Encounter: Payer: Self-pay | Admitting: Internal Medicine

## 2017-09-21 ENCOUNTER — Non-Acute Institutional Stay (SKILLED_NURSING_FACILITY): Payer: Medicare Other | Admitting: Internal Medicine

## 2017-09-21 DIAGNOSIS — Z794 Long term (current) use of insulin: Secondary | ICD-10-CM | POA: Diagnosis not present

## 2017-09-21 DIAGNOSIS — F3170 Bipolar disorder, currently in remission, most recent episode unspecified: Secondary | ICD-10-CM | POA: Diagnosis not present

## 2017-09-21 DIAGNOSIS — E119 Type 2 diabetes mellitus without complications: Secondary | ICD-10-CM

## 2017-09-21 DIAGNOSIS — E559 Vitamin D deficiency, unspecified: Secondary | ICD-10-CM | POA: Diagnosis not present
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
## 2017-10-16 ENCOUNTER — Encounter: Payer: Self-pay | Admitting: Internal Medicine

## 2017-10-16 NOTE — Assessment & Plan Note (Signed)
Blood sugars have been running low; patient's Lantus has been decreased from 22 down to 18 with resolution of the problem; continue Lantus 18 units daily, Glucophage thousand milligrams twice daily and sliding scale insulin with meals; patient is on ACE and statin

## 2017-10-16 NOTE — Assessment & Plan Note (Signed)
Appears uncontrolled; continue Depakote 500 mg daily and Trileptal 600 mg daily

## 2017-10-16 NOTE — Assessment & Plan Note (Signed)
Stable; continue replacement 1000 units daily 

## 2017-10-22 ENCOUNTER — Encounter: Payer: Self-pay | Admitting: Internal Medicine

## 2017-10-22 ENCOUNTER — Non-Acute Institutional Stay (SKILLED_NURSING_FACILITY): Payer: Medicare Other | Admitting: Internal Medicine

## 2017-10-22 DIAGNOSIS — I10 Essential (primary) hypertension: Secondary | ICD-10-CM | POA: Diagnosis not present

## 2017-10-22 DIAGNOSIS — R627 Adult failure to thrive: Secondary | ICD-10-CM | POA: Diagnosis not present

## 2017-10-22 DIAGNOSIS — I2510 Atherosclerotic heart disease of native coronary artery without angina pectoris: Secondary | ICD-10-CM | POA: Diagnosis not present
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
## 2017-11-10 ENCOUNTER — Other Ambulatory Visit: Payer: Self-pay

## 2017-11-10 ENCOUNTER — Emergency Department (HOSPITAL_COMMUNITY): Payer: Medicare Other

## 2017-11-10 ENCOUNTER — Encounter (HOSPITAL_COMMUNITY): Payer: Self-pay | Admitting: Emergency Medicine

## 2017-11-10 ENCOUNTER — Emergency Department (HOSPITAL_COMMUNITY)
Admission: EM | Admit: 2017-11-10 | Discharge: 2017-11-10 | Disposition: A | Discharge status: Home / Self Care | Payer: Medicare Other | Attending: Emergency Medicine | Admitting: Emergency Medicine

## 2017-11-10 DIAGNOSIS — F0390 Unspecified dementia without behavioral disturbance: Secondary | ICD-10-CM | POA: Insufficient documentation

## 2017-11-10 DIAGNOSIS — E039 Hypothyroidism, unspecified: Secondary | ICD-10-CM | POA: Insufficient documentation

## 2017-11-10 DIAGNOSIS — E119 Type 2 diabetes mellitus without complications: Secondary | ICD-10-CM | POA: Insufficient documentation

## 2017-11-10 DIAGNOSIS — I10 Essential (primary) hypertension: Secondary | ICD-10-CM | POA: Diagnosis not present

## 2017-11-10 DIAGNOSIS — Z794 Long term (current) use of insulin: Secondary | ICD-10-CM | POA: Diagnosis not present

## 2017-11-10 DIAGNOSIS — J45909 Unspecified asthma, uncomplicated: Secondary | ICD-10-CM | POA: Diagnosis not present

## 2017-11-10 DIAGNOSIS — W19XXXA Unspecified fall, initial encounter: Secondary | ICD-10-CM | POA: Diagnosis not present

## 2017-11-10 DIAGNOSIS — Z7982 Long term (current) use of aspirin: Secondary | ICD-10-CM | POA: Insufficient documentation

## 2017-11-10 DIAGNOSIS — I2510 Atherosclerotic heart disease of native coronary artery without angina pectoris: Secondary | ICD-10-CM | POA: Diagnosis not present

## 2017-11-10 DIAGNOSIS — Y999 Unspecified external cause status: Secondary | ICD-10-CM | POA: Diagnosis not present

## 2017-11-10 DIAGNOSIS — S0993XA Unspecified injury of face, initial encounter: Secondary | ICD-10-CM | POA: Diagnosis present

## 2017-11-10 DIAGNOSIS — Y92199 Unspecified place in other specified residential institution as the place of occurrence of the external cause: Secondary | ICD-10-CM | POA: Insufficient documentation

## 2017-11-10 DIAGNOSIS — Y939 Activity, unspecified: Secondary | ICD-10-CM | POA: Insufficient documentation

## 2017-11-10 DIAGNOSIS — S0181XA Laceration without foreign body of other part of head, initial encounter: Secondary | ICD-10-CM | POA: Diagnosis not present

## 2017-11-10 DIAGNOSIS — Z87891 Personal history of nicotine dependence: Secondary | ICD-10-CM | POA: Diagnosis not present

## 2017-11-10 DIAGNOSIS — Z79899 Other long term (current) drug therapy: Secondary | ICD-10-CM | POA: Diagnosis not present

## 2017-11-10 NOTE — ED Triage Notes (Addendum)
Pt arrived via ems, from SNF, ems reports patient had a witnessed fall from from his bed which was 2 ft off the ground. Does have a 1in lac on his forehead, bleeding is controlled at this time. No LOC. Is on aspirin but no other blood thinners. Is oriented to self and time. Hx of dementia.

## 2017-11-10 NOTE — Discharge Instructions (Signed)
As discussed, your evaluation today has been largely reassuring.  But, it is important that you monitor your condition carefully, and do not hesitate to return to the ED if you develop new, or concerning changes in your condition. ? ?Otherwise, please follow-up with your physician for appropriate ongoing care. ? ?

## 2017-11-10 NOTE — ED Notes (Signed)
[facility] called for transport to [facility]. 

## 2017-11-10 NOTE — ED Notes (Signed)
Patient transported to CT 
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
## 2017-11-10 NOTE — ED Notes (Signed)
Attempted to call RN at SNF, left call back number with front desk.

## 2017-11-10 NOTE — ED Notes (Signed)
[facility]

## 2017-11-10 NOTE — ED Notes (Signed)
ED Provider at bedside. 

## 2017-11-16 ENCOUNTER — Encounter: Payer: Self-pay | Admitting: Internal Medicine

## 2017-11-16 DIAGNOSIS — R627 Adult failure to thrive: Secondary | ICD-10-CM | POA: Insufficient documentation

## 2017-11-16 NOTE — Assessment & Plan Note (Signed)
No reports of chest pain or equivalent; continue Toprol-XL 25 mg p.o. twice daily, ASA 81 mg daily; patient is on statin

## 2017-11-16 NOTE — Assessment & Plan Note (Signed)
Controlled; continue Toprol-XL 25 mg twice daily, and lisinopril 10 mg p.o. daily

## 2017-11-16 NOTE — Assessment & Plan Note (Signed)
Patient continues to eat less and lose weight; he is becoming very frail; end-stage dementia with bipolar disorder

## 2017-11-17 ENCOUNTER — Encounter: Payer: Self-pay | Admitting: Internal Medicine

## 2017-11-17 ENCOUNTER — Non-Acute Institutional Stay (SKILLED_NURSING_FACILITY): Payer: Medicare Other | Admitting: Internal Medicine

## 2017-11-17 DIAGNOSIS — D508 Other iron deficiency anemias: Secondary | ICD-10-CM | POA: Diagnosis not present

## 2017-11-17 DIAGNOSIS — K219 Gastro-esophageal reflux disease without esophagitis: Secondary | ICD-10-CM

## 2017-11-17 DIAGNOSIS — I639 Cerebral infarction, unspecified: Secondary | ICD-10-CM
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
## 2017-12-05 ENCOUNTER — Encounter: Payer: Self-pay | Admitting: Internal Medicine

## 2017-12-05 NOTE — Assessment & Plan Note (Signed)
No reports of reflux or heartburn; continue Dexilant 60 mg daily

## 2017-12-05 NOTE — Assessment & Plan Note (Signed)
Mild right hemiparesis; patient's A1c is 6.5, patient is on statin and will continue ASA 81 mg p.o. daily as prophylaxis

## 2017-12-05 NOTE — Assessment & Plan Note (Signed)
Hemoglobin is 13.1 which is decreased from prior but still good; continue iron 324 mg daily

## 2017-12-09 ENCOUNTER — Encounter: Payer: Self-pay | Admitting: Internal Medicine

## 2017-12-09 ENCOUNTER — Non-Acute Institutional Stay (SKILLED_NURSING_FACILITY): Payer: Medicare Other | Admitting: Internal Medicine

## 2017-12-09 DIAGNOSIS — E559 Vitamin D deficiency, unspecified: Secondary | ICD-10-CM

## 2017-12-09 DIAGNOSIS — F3170 Bipolar disorder, currently in remission, most recent episode unspecified: Secondary | ICD-10-CM | POA: Diagnosis not present

## 2017-12-09 DIAGNOSIS — E119 Type 2 diabetes mellitus without complications: Secondary | ICD-10-CM | POA: Diagnosis not present

## 2017-12-09 DIAGNOSIS — Z794 Long term (current) use of insulin: Secondary | ICD-10-CM | POA: Diagnosis not present
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
## 2018-01-03 ENCOUNTER — Encounter: Payer: Self-pay | Admitting: Internal Medicine

## 2018-01-03 NOTE — Assessment & Plan Note (Signed)
A1c 6.5; continue Glucophage 1000 mg twice daily, Lantus insulin 18 units nightly and sliding scale insulin with meals

## 2018-01-03 NOTE — Assessment & Plan Note (Signed)
Stable; continue on 1000 units p.o. daily replacement

## 2018-01-03 NOTE — Assessment & Plan Note (Signed)
Appears controlled; continue Trileptal 600 mg daily and Depakote 500 mg daily

## 2018-01-06 ENCOUNTER — Non-Acute Institutional Stay (SKILLED_NURSING_FACILITY): Payer: Medicare Other

## 2018-01-06 DIAGNOSIS — Z0000 Encounter for general adult medical examination without abnormal findings: Secondary | ICD-10-CM | POA: Diagnosis not present
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
## 2018-01-06 NOTE — Patient Instructions (Addendum)
[name] , Thank you for taking time to come for your Medicare Wellness Visit. I appreciate your ongoing commitment to your health goals. Please review the following plan we discussed and let me know if I can assist you in the future.   Screening recommendations/referrals: Colonoscopy excluded, patient has dementia and is unambulatory Recommended yearly ophthalmology/optometry visit for glaucoma screening and checkup Recommended yearly dental visit for hygiene and checkup  Vaccinations: Influenza vaccine due 2019 fall season Pneumococcal vaccine up to date, completed Tdap vaccine up to date Shingles vaccine not in past records    Advanced directives: in chart  Conditions/risks identified: none  Next appointment: Dr. Lyn HollingsheadAlexander makes rounds  Preventive Care 65 Years and Older, Male Preventive care refers to lifestyle choices and visits with your health care provider that can promote health and wellness. What does preventive care include?  A yearly physical exam. This is also called an annual well check.  Dental exams once or twice a year.  Routine eye exams. Ask your health care provider how often you should have your eyes checked.  Personal lifestyle choices, including:  Daily care of your teeth and gums.  Regular physical activity.  Eating a healthy diet.  Avoiding tobacco and drug use.  Limiting alcohol use.  Practicing safe sex.  Taking low doses of aspirin every day.  Taking vitamin and mineral supplements as recommended by your health care provider. What happens during an annual well check? The services and screenings done by your health care provider during your annual well check will depend on your age, overall health, lifestyle risk factors, and family history of disease. Counseling  Your health care provider may ask you questions about your:  Alcohol use.  Tobacco use.  Drug use.  Emotional well-being.  Home and relationship  well-being.  Sexual activity.  Eating habits.  History of falls.  Memory and ability to understand (cognition).  Work and work Astronomerenvironment. Screening  You may have the following tests or measurements:  Height, weight, and BMI.  Blood pressure.  Lipid and cholesterol levels. These may be checked every 5 years, or more frequently if you are over 69 years old.  Skin check.  Lung cancer screening. You may have this screening every year starting at age 69 if you have a 30-pack-year history of smoking and currently smoke or have quit within the past 15 years.  Fecal occult blood test (FOBT) of the stool. You may have this test every year starting at age 69.  Flexible sigmoidoscopy or colonoscopy. You may have a sigmoidoscopy every 5 years or a colonoscopy every 10 years starting at age 69.  Prostate cancer screening. Recommendations will vary depending on your family history and other risks.  Hepatitis C blood test.  Hepatitis B blood test.  Sexually transmitted disease (STD) testing.  Diabetes screening. This is done by checking your blood sugar (glucose) after you have not eaten for a while (fasting). You may have this done every 1-3 years.  Abdominal aortic aneurysm (AAA) screening. You may need this if you are a current or former smoker.  Osteoporosis. You may be screened starting at age 69 if you are at high risk. Talk with your health care provider about your test results, treatment options, and if necessary, the need for more tests. Vaccines  Your health care provider may recommend certain vaccines, such as:  Influenza vaccine. This is recommended every year.  Tetanus, diphtheria, and acellular pertussis (Tdap, Td) vaccine. You may need a Td booster  every 10 years.  Zoster vaccine. You may need this after age 18.  Pneumococcal 13-valent conjugate (PCV13) vaccine. One dose is recommended after age 67.  Pneumococcal polysaccharide (PPSV23) vaccine. One dose is  recommended after age 15. Talk to your health care provider about which screenings and vaccines you need and how often you need them. This information is not intended to replace advice given to you by your health care provider. Make sure you discuss any questions you have with your health care provider. Document Released: 06/07/2015 Document Revised: 01/29/2016 Document Reviewed: 03/12/2015 Elsevier Interactive Patient Education  2017 [place] Prevention in the Home Falls can cause injuries. They can happen to people of all ages. There are many things you can do to make your home safe and to help prevent falls. What can I do on the outside of my home?  Regularly fix the edges of walkways and driveways and fix any cracks.  Remove anything that might make you trip as you walk through a door, such as a raised step or threshold.  Trim any bushes or trees on the path to your home.  Use bright outdoor lighting.  Clear any walking paths of anything that might make someone trip, such as rocks or tools.  Regularly check to see if handrails are loose or broken. Make sure that both sides of any steps have handrails.  Any raised decks and porches should have guardrails on the edges.  Have any leaves, snow, or ice cleared regularly.  Use sand or salt on walking paths during winter.  Clean up any spills in your garage right away. This includes oil or grease spills. What can I do in the bathroom?  Use night lights.  Install grab bars by the toilet and in the tub and shower. Do not use towel bars as grab bars.  Use non-skid mats or decals in the tub or shower.  If you need to sit down in the shower, use a plastic, non-slip stool.  Keep the floor dry. Clean up any water that spills on the floor as soon as it happens.  Remove soap buildup in the tub or shower regularly.  Attach bath mats securely with double-sided non-slip rug tape.  Do not have throw rugs and other things on  the floor that can make you trip. What can I do in the bedroom?  Use night lights.  Make sure that you have a light by your bed that is easy to reach.  Do not use any sheets or blankets that are too big for your bed. They should not hang down onto the floor.  Have a firm chair that has side arms. You can use this for support while you get dressed.  Do not have throw rugs and other things on the floor that can make you trip. What can I do in the kitchen?  Clean up any spills right away.  Avoid walking on wet floors.  Keep items that you use a lot in easy-to-reach places.  If you need to reach something above you, use a strong step stool that has a grab bar.  Keep electrical cords out of the way.  Do not use floor polish or wax that makes floors slippery. If you must use wax, use non-skid floor wax.  Do not have throw rugs and other things on the floor that can make you trip. What can I do with my stairs?  Do not leave any items on the stairs.  Make  sure that there are handrails on both sides of the stairs and use them. Fix handrails that are broken or loose. Make sure that handrails are as long as the stairways.  Check any carpeting to make sure that it is firmly attached to the stairs. Fix any carpet that is loose or worn.  Avoid having throw rugs at the top or bottom of the stairs. If you do have throw rugs, attach them to the floor with carpet tape.  Make sure that you have a light switch at the top of the stairs and the bottom of the stairs. If you do not have them, ask someone to add them for you. What else can I do to help prevent falls?  Wear shoes that:  Do not have high heels.  Have rubber bottoms.  Are comfortable and fit you well.  Are closed at the toe. Do not wear sandals.  If you use a stepladder:  Make sure that it is fully opened. Do not climb a closed stepladder.  Make sure that both sides of the stepladder are locked into place.  Ask someone to  hold it for you, if possible.  Clearly mark and make sure that you can see:  Any grab bars or handrails.  First and last steps.  Where the edge of each step is.  Use tools that help you move around (mobility aids) if they are needed. These include:  Canes.  Walkers.  Scooters.  Crutches.  Turn on the lights when you go into a dark area. Replace any light bulbs as soon as they burn out.  Set up your furniture so you have a clear path. Avoid moving your furniture around.  If any of your floors are uneven, fix them.  If there are any pets around you, be aware of where they are.  Review your medicines with your doctor. Some medicines can make you feel dizzy. This can increase your chance of falling. Ask your doctor what other things that you can do to help prevent falls. This information is not intended to replace advice given to you by your health care provider. Make sure you discuss any questions you have with your health care provider. Document Released: 03/07/2009 Document Revised: 10/17/2015 Document Reviewed: 06/15/2014 Elsevier Interactive Patient Education  2017 [employer].

## 2018-01-17 ENCOUNTER — Encounter: Payer: Self-pay | Admitting: Internal Medicine

## 2018-01-17 ENCOUNTER — Non-Acute Institutional Stay (SKILLED_NURSING_FACILITY): Payer: Medicare Other | Admitting: Internal Medicine

## 2018-01-17 DIAGNOSIS — F99 Mental disorder, not otherwise specified: Secondary | ICD-10-CM | POA: Diagnosis not present

## 2018-01-17 DIAGNOSIS — E782 Mixed hyperlipidemia: Secondary | ICD-10-CM | POA: Diagnosis not present

## 2018-01-17 DIAGNOSIS — F329 Major depressive disorder, single episode, unspecified: Secondary | ICD-10-CM | POA: Diagnosis not present

## 2018-01-17 DIAGNOSIS — F32A Depression, unspecified: Secondary | ICD-10-CM

## 2018-01-17 DIAGNOSIS — F5105 Insomnia due to other mental disorder: Secondary | ICD-10-CM
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
## 2018-01-19 ENCOUNTER — Encounter: Payer: Self-pay | Admitting: Internal Medicine

## 2018-01-19 NOTE — Assessment & Plan Note (Signed)
No reported problems; continue trazodone 75 mg nightly

## 2018-01-19 NOTE — Assessment & Plan Note (Signed)
Appears stable; continue trazodone 75 mg nightly

## 2018-01-19 NOTE — Assessment & Plan Note (Signed)
Good control; continue Lipitor 40 mg daily 

## 2018-02-14 ENCOUNTER — Encounter: Payer: Self-pay | Admitting: Internal Medicine

## 2018-02-14 ENCOUNTER — Non-Acute Institutional Stay (SKILLED_NURSING_FACILITY): Payer: Medicare Other | Admitting: Internal Medicine

## 2018-02-14 DIAGNOSIS — I10 Essential (primary) hypertension: Secondary | ICD-10-CM | POA: Diagnosis not present

## 2018-02-14 DIAGNOSIS — R627 Adult failure to thrive: Secondary | ICD-10-CM

## 2018-02-14 DIAGNOSIS — I2510 Atherosclerotic heart disease of native coronary artery without angina pectoris: Secondary | ICD-10-CM | POA: Diagnosis not present
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
## 2018-02-19 ENCOUNTER — Encounter: Payer: Self-pay | Admitting: Internal Medicine

## 2018-02-19 NOTE — Assessment & Plan Note (Signed)
Patient is still continuing to not eat and drink; patient interacts very little; being worked up for change in ability to swallow liquids; will monitor results of study

## 2018-02-19 NOTE — Assessment & Plan Note (Signed)
No reports of chest pain or equivalent; continue ASA 81 mg daily, and Toprol-XL 25 mg twice daily; patient is on statin

## 2018-02-19 NOTE — Assessment & Plan Note (Signed)
Uncontrolled; continue lisinopril 10 mg daily and Toprol-XL 25 mg twice daily

## 2018-02-21 ENCOUNTER — Non-Acute Institutional Stay (SKILLED_NURSING_FACILITY): Payer: Medicare Other | Admitting: Internal Medicine

## 2018-02-21 ENCOUNTER — Encounter: Payer: Self-pay | Admitting: Internal Medicine

## 2018-02-21 DIAGNOSIS — R627 Adult failure to thrive: Secondary | ICD-10-CM | POA: Diagnosis not present

## 2018-02-21 DIAGNOSIS — Z7189 Other specified counseling: Secondary | ICD-10-CM | POA: Diagnosis not present
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
## 2018-03-22 ENCOUNTER — Encounter: Payer: Self-pay | Admitting: Internal Medicine

## 2018-03-25 DEATH — deceased
